# Patient Record
Sex: Male | Born: 1955 | Race: Black or African American | Hispanic: No | Marital: Married | Smoking: Never smoker
Health system: Southern US, Community
[De-identification: ages and names within clinical notes are randomized; demographics above are authoritative.]

## PROBLEM LIST (undated history)

## (undated) DIAGNOSIS — F329 Major depressive disorder, single episode, unspecified: Secondary | ICD-10-CM

## (undated) DIAGNOSIS — E785 Hyperlipidemia, unspecified: Secondary | ICD-10-CM

## (undated) DIAGNOSIS — E119 Type 2 diabetes mellitus without complications: Secondary | ICD-10-CM

## (undated) DIAGNOSIS — I2510 Atherosclerotic heart disease of native coronary artery without angina pectoris: Secondary | ICD-10-CM

## (undated) DIAGNOSIS — J45909 Unspecified asthma, uncomplicated: Secondary | ICD-10-CM

## (undated) DIAGNOSIS — K5792 Diverticulitis of intestine, part unspecified, without perforation or abscess without bleeding: Secondary | ICD-10-CM

## (undated) DIAGNOSIS — F32A Depression, unspecified: Secondary | ICD-10-CM

## (undated) DIAGNOSIS — I519 Heart disease, unspecified: Secondary | ICD-10-CM

## (undated) DIAGNOSIS — I82409 Acute embolism and thrombosis of unspecified deep veins of unspecified lower extremity: Secondary | ICD-10-CM

## (undated) DIAGNOSIS — I10 Essential (primary) hypertension: Secondary | ICD-10-CM

## (undated) HISTORY — DX: Depression, unspecified: F32.A

## (undated) HISTORY — PX: APPENDECTOMY: SHX54

## (undated) HISTORY — PX: CHOLECYSTECTOMY: SHX55

## (undated) HISTORY — PX: COLOSTOMY REVERSAL: SHX5782

## (undated) HISTORY — DX: Heart disease, unspecified: I51.9

## (undated) HISTORY — DX: Hyperlipidemia, unspecified: E78.5

## (undated) HISTORY — DX: Atherosclerotic heart disease of native coronary artery without angina pectoris: I25.10

## (undated) HISTORY — PX: KNEE SURGERY: SHX244

---
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
## 1998-01-27 ENCOUNTER — Ambulatory Visit (HOSPITAL_BASED_OUTPATIENT_CLINIC_OR_DEPARTMENT_OTHER): Admission: RE | Admit: 1998-01-27 | Discharge: 1998-01-27 | Payer: Self-pay | Admitting: Orthopedic Surgery

## 1999-01-26 ENCOUNTER — Ambulatory Visit (HOSPITAL_BASED_OUTPATIENT_CLINIC_OR_DEPARTMENT_OTHER): Admission: RE | Admit: 1999-01-26 | Discharge: 1999-01-26 | Payer: Self-pay | Admitting: Orthopedic Surgery

## 1999-02-02 ENCOUNTER — Inpatient Hospital Stay (HOSPITAL_COMMUNITY): Admission: EM | Admit: 1999-02-02 | Discharge: 1999-02-07 | Payer: Self-pay | Admitting: Emergency Medicine

## 1999-02-12 ENCOUNTER — Inpatient Hospital Stay (HOSPITAL_COMMUNITY): Admission: EM | Admit: 1999-02-12 | Discharge: 1999-02-20 | Payer: Self-pay | Admitting: Internal Medicine

## 1999-02-12 ENCOUNTER — Encounter: Payer: Self-pay | Admitting: Surgery

## 1999-02-18 ENCOUNTER — Encounter: Payer: Self-pay | Admitting: Surgery

## 1999-04-01 ENCOUNTER — Emergency Department (HOSPITAL_COMMUNITY): Admission: EM | Admit: 1999-04-01 | Discharge: 1999-04-01 | Payer: Self-pay | Admitting: Emergency Medicine

## 1999-04-22 ENCOUNTER — Ambulatory Visit (HOSPITAL_COMMUNITY): Admission: RE | Admit: 1999-04-22 | Discharge: 1999-04-22 | Payer: Self-pay | Admitting: Surgery

## 1999-04-22 ENCOUNTER — Encounter: Payer: Self-pay | Admitting: Surgery

## 1999-04-23 ENCOUNTER — Ambulatory Visit (HOSPITAL_COMMUNITY): Admission: RE | Admit: 1999-04-23 | Discharge: 1999-04-23 | Payer: Self-pay | Admitting: Surgery

## 1999-04-23 ENCOUNTER — Encounter: Payer: Self-pay | Admitting: Surgery

## 1999-04-29 ENCOUNTER — Emergency Department (HOSPITAL_COMMUNITY): Admission: EM | Admit: 1999-04-29 | Discharge: 1999-04-29 | Payer: Self-pay | Admitting: *Deleted

## 1999-05-04 ENCOUNTER — Encounter: Payer: Self-pay | Admitting: Surgery

## 1999-05-04 ENCOUNTER — Ambulatory Visit (HOSPITAL_COMMUNITY): Admission: RE | Admit: 1999-05-04 | Discharge: 1999-05-04 | Payer: Self-pay | Admitting: Surgery

## 2000-05-02 ENCOUNTER — Ambulatory Visit (HOSPITAL_BASED_OUTPATIENT_CLINIC_OR_DEPARTMENT_OTHER): Admission: RE | Admit: 2000-05-02 | Discharge: 2000-05-02 | Payer: Self-pay | Admitting: Orthopedic Surgery

## 2000-05-03 ENCOUNTER — Emergency Department (HOSPITAL_COMMUNITY): Admission: EM | Admit: 2000-05-03 | Discharge: 2000-05-03 | Payer: Self-pay | Admitting: Emergency Medicine

## 2000-10-14 ENCOUNTER — Encounter: Payer: Self-pay | Admitting: Surgery

## 2000-10-14 ENCOUNTER — Observation Stay (HOSPITAL_COMMUNITY): Admission: RE | Admit: 2000-10-14 | Discharge: 2000-10-15 | Payer: Self-pay | Admitting: Surgery

## 2001-04-10 ENCOUNTER — Inpatient Hospital Stay (HOSPITAL_COMMUNITY): Admission: RE | Admit: 2001-04-10 | Discharge: 2001-04-20 | Payer: Self-pay | Admitting: Surgery

## 2001-04-11 ENCOUNTER — Encounter: Payer: Self-pay | Admitting: Internal Medicine

## 2001-08-01 ENCOUNTER — Encounter: Payer: Self-pay | Admitting: Emergency Medicine

## 2001-08-01 ENCOUNTER — Emergency Department (HOSPITAL_COMMUNITY): Admission: EM | Admit: 2001-08-01 | Discharge: 2001-08-01 | Payer: Self-pay | Admitting: Emergency Medicine

## 2001-08-29 ENCOUNTER — Inpatient Hospital Stay (HOSPITAL_COMMUNITY): Admission: RE | Admit: 2001-08-29 | Discharge: 2001-09-06 | Payer: Self-pay | Admitting: Surgery

## 2001-09-03 ENCOUNTER — Encounter: Payer: Self-pay | Admitting: Surgery

## 2001-09-12 ENCOUNTER — Ambulatory Visit (HOSPITAL_COMMUNITY): Admission: RE | Admit: 2001-09-12 | Discharge: 2001-09-12 | Payer: Self-pay | Admitting: Surgery

## 2001-09-12 ENCOUNTER — Encounter: Payer: Self-pay | Admitting: Surgery

## 2001-09-18 ENCOUNTER — Encounter: Payer: Self-pay | Admitting: Surgery

## 2001-09-18 ENCOUNTER — Inpatient Hospital Stay (HOSPITAL_COMMUNITY): Admission: EM | Admit: 2001-09-18 | Discharge: 2001-09-26 | Payer: Self-pay | Admitting: *Deleted

## 2001-09-19 ENCOUNTER — Encounter: Payer: Self-pay | Admitting: Surgery

## 2001-09-24 ENCOUNTER — Encounter: Payer: Self-pay | Admitting: Surgery

## 2001-10-17 ENCOUNTER — Encounter: Payer: Self-pay | Admitting: Surgery

## 2001-10-17 ENCOUNTER — Ambulatory Visit (HOSPITAL_COMMUNITY): Admission: RE | Admit: 2001-10-17 | Discharge: 2001-10-17 | Payer: Self-pay | Admitting: Surgery

## 2001-11-29 ENCOUNTER — Ambulatory Visit (HOSPITAL_COMMUNITY): Admission: RE | Admit: 2001-11-29 | Discharge: 2001-11-29 | Payer: Self-pay | Admitting: Gastroenterology

## 2001-12-26 ENCOUNTER — Encounter: Payer: Self-pay | Admitting: Surgery

## 2001-12-26 ENCOUNTER — Ambulatory Visit (HOSPITAL_COMMUNITY): Admission: RE | Admit: 2001-12-26 | Discharge: 2001-12-26 | Payer: Self-pay | Admitting: Surgery

## 2002-01-01 ENCOUNTER — Inpatient Hospital Stay (HOSPITAL_COMMUNITY): Admission: AD | Admit: 2002-01-01 | Discharge: 2002-01-04 | Payer: Self-pay | Admitting: Surgery

## 2002-09-07 ENCOUNTER — Ambulatory Visit (HOSPITAL_COMMUNITY): Admission: RE | Admit: 2002-09-07 | Discharge: 2002-09-07 | Payer: Self-pay | Admitting: Surgery

## 2002-09-07 ENCOUNTER — Encounter: Payer: Self-pay | Admitting: Surgery

## 2002-10-18 ENCOUNTER — Inpatient Hospital Stay (HOSPITAL_COMMUNITY): Admission: RE | Admit: 2002-10-18 | Discharge: 2002-10-21 | Payer: Self-pay | Admitting: Surgery

## 2003-07-26 ENCOUNTER — Encounter
Admission: RE | Admit: 2003-07-26 | Discharge: 2003-10-24 | Payer: Self-pay | Admitting: Physical Medicine & Rehabilitation

## 2003-10-31 ENCOUNTER — Encounter
Admission: RE | Admit: 2003-10-31 | Discharge: 2004-01-29 | Payer: Self-pay | Admitting: Physical Medicine & Rehabilitation

## 2004-04-24 ENCOUNTER — Encounter
Admission: RE | Admit: 2004-04-24 | Discharge: 2004-07-23 | Payer: Self-pay | Admitting: Physical Medicine & Rehabilitation

## 2005-11-22 ENCOUNTER — Encounter: Admission: RE | Admit: 2005-11-22 | Discharge: 2005-11-22 | Payer: Self-pay | Admitting: Surgery

## 2005-12-13 ENCOUNTER — Encounter: Admission: RE | Admit: 2005-12-13 | Discharge: 2005-12-13 | Payer: Self-pay | Admitting: Surgery

## 2005-12-28 ENCOUNTER — Ambulatory Visit (HOSPITAL_COMMUNITY): Admission: RE | Admit: 2005-12-28 | Discharge: 2005-12-28 | Payer: Self-pay | Admitting: Surgery

## 2006-02-21 ENCOUNTER — Ambulatory Visit (HOSPITAL_COMMUNITY): Admission: RE | Admit: 2006-02-21 | Discharge: 2006-02-21 | Payer: Self-pay | Admitting: Surgery

## 2006-04-23 ENCOUNTER — Emergency Department (HOSPITAL_COMMUNITY): Admission: EM | Admit: 2006-04-23 | Discharge: 2006-04-24 | Payer: Self-pay | Admitting: Emergency Medicine

## 2013-02-18 ENCOUNTER — Emergency Department (HOSPITAL_BASED_OUTPATIENT_CLINIC_OR_DEPARTMENT_OTHER): Payer: 59

## 2013-02-18 ENCOUNTER — Emergency Department (HOSPITAL_BASED_OUTPATIENT_CLINIC_OR_DEPARTMENT_OTHER)
Admission: EM | Admit: 2013-02-18 | Discharge: 2013-02-18 | Disposition: A | Discharge status: Home / Self Care | Payer: 59 | Attending: Emergency Medicine | Admitting: Emergency Medicine

## 2013-02-18 ENCOUNTER — Encounter (HOSPITAL_BASED_OUTPATIENT_CLINIC_OR_DEPARTMENT_OTHER): Payer: Self-pay | Admitting: *Deleted

## 2013-02-18 DIAGNOSIS — Z79899 Other long term (current) drug therapy: Secondary | ICD-10-CM | POA: Insufficient documentation

## 2013-02-18 DIAGNOSIS — J45909 Unspecified asthma, uncomplicated: Secondary | ICD-10-CM

## 2013-02-18 DIAGNOSIS — Z8719 Personal history of other diseases of the digestive system: Secondary | ICD-10-CM | POA: Insufficient documentation

## 2013-02-18 DIAGNOSIS — J45901 Unspecified asthma with (acute) exacerbation: Secondary | ICD-10-CM | POA: Insufficient documentation

## 2013-02-18 DIAGNOSIS — R509 Fever, unspecified: Secondary | ICD-10-CM | POA: Insufficient documentation

## 2013-02-18 DIAGNOSIS — Z8701 Personal history of pneumonia (recurrent): Secondary | ICD-10-CM | POA: Insufficient documentation

## 2013-02-18 DIAGNOSIS — J3489 Other specified disorders of nose and nasal sinuses: Secondary | ICD-10-CM | POA: Insufficient documentation

## 2013-02-18 HISTORY — DX: Diverticulitis of intestine, part unspecified, without perforation or abscess without bleeding: K57.92

## 2013-02-18 HISTORY — DX: Unspecified asthma, uncomplicated: J45.909

## 2013-02-18 MED ORDER — AZITHROMYCIN 250 MG PO TABS: ORAL_TABLET | ORAL | Status: DC

## 2013-02-18 MED ORDER — ALBUTEROL SULFATE (5 MG/ML) 0.5% IN NEBU
5.0000 mg | INHALATION_SOLUTION | Freq: Once | RESPIRATORY_TRACT | Status: AC
  Administered 2013-02-18: 5 mg via RESPIRATORY_TRACT
  Filled 2013-02-18: qty 1

## 2013-02-18 MED ORDER — IPRATROPIUM BROMIDE 0.02 % IN SOLN
0.5000 mg | Freq: Once | RESPIRATORY_TRACT | Status: AC
  Administered 2013-02-18: 0.5 mg via RESPIRATORY_TRACT
  Filled 2013-02-18: qty 2.5

## 2013-02-18 MED ORDER — PREDNISONE 10 MG PO TABS: 20.0000 mg | ORAL_TABLET | Freq: Every day | ORAL | Status: DC

## 2013-02-18 MED ORDER — PREDNISONE 10 MG PO TABS
60.0000 mg | ORAL_TABLET | Freq: Once | ORAL | Status: AC
  Administered 2013-02-18: 60 mg via ORAL
  Filled 2013-02-18: qty 1

## 2013-02-18 NOTE — ED Notes (Signed)
rx x 2 given for prednisone and zithromax

## 2013-02-18 NOTE — ED Notes (Signed)
Cough, fever onset Friday

## 2013-02-18 NOTE — ED Provider Notes (Signed)
History    This chart was scribed for Eric Quarry, MD by Donne Anon, ED Scribe. This patient was seen in room MH07/MH07 and the patient's care was started at 1837.   CSN: [id number]  Arrival date & time 02/18/13  1803   First MD Initiated Contact with Patient 02/18/13 1837      Chief Complaint  Patient presents with  . Cough     The history is provided by the patient. No language interpreter was used.   Eric Cohen is a 57 y.o. male who presents to the Emergency Department complaining of gradual onset, gradually worsening productive cough which began 2 days ago. He states he also associated SOB, rhinorrhea, congestion and fever (100.2) which has since subsided. He reports normal fluid intake. He used his inhaler 4 times today with mild relief. He has tried NyQuill, Robitussum and Ibuprofen (last dose at 2 PM) with mild relief. He has never been hospitalized for his asthma. He has pneumonia last year, which was the last time he was on steroids, but was not hospitalized for it.  He sees the [facility] for his PCP.    Past Medical History  Diagnosis Date  . Asthma   . Diverticulitis     Past Surgical History  Procedure Laterality Date  . Appendectomy    . Cholecystectomy    . Knee surgery    . Colostomy reversal      History reviewed. No pertinent family history.  History  Substance Use Topics  . Smoking status: Never Smoker   . Smokeless tobacco: Not on file  . Alcohol Use: No      Review of Systems  Constitutional: Positive for fever.  HENT: Positive for congestion and rhinorrhea.   Respiratory: Positive for cough and shortness of breath.   All other systems reviewed and are negative.    Allergies  Review of patient's allergies indicates no known allergies.  Home Medications   Current Outpatient Rx  Name  Route  Sig  Dispense  Refill  . albuterol (PROVENTIL HFA;VENTOLIN HFA) 108 (90 BASE) MCG/ACT inhaler   Inhalation   Inhale 2 puffs into the lungs  every 6 (six) hours as needed for wheezing.         . ALBUTEROL IN   Inhalation   Inhale into the lungs.           BP 169/88  Pulse 106  Temp(Src) 98.6 F (37 C) (Oral)  Resp 20  Ht 5' 11.5" (1.816 m)  Wt 240 lb (108.863 kg)  BMI 33.01 kg/m2  SpO2 95%  Physical Exam  Nursing note and vitals reviewed. Constitutional: He is oriented to person, place, and time. He appears well-developed and well-nourished. No distress.  HENT:  Head: Normocephalic and atraumatic.  Mouth/Throat: Oropharynx is clear and moist.  Eyes: EOM are normal.  Neck: Neck supple. No tracheal deviation present.  Cardiovascular: Normal rate, regular rhythm and normal heart sounds.   Pulmonary/Chest: Effort normal. No respiratory distress.  Musculoskeletal: Normal range of motion.  Neurological: He is alert and oriented to person, place, and time.  Skin: Skin is warm and dry.  Psychiatric: He has a normal mood and affect. His behavior is normal.    ED Course  Procedures (including critical care time) DIAGNOSTIC STUDIES: Oxygen Saturation is 96% on room air, adequate by my interpretation.    COORDINATION OF CARE: 6:43 PM Discussed treatment plan which includes a breathing treatment, steroids and CXR with pt at bedside and pt  agreed to plan.   7:53 PM   Labs Reviewed - No data to display No results found.   No diagnosis found.    MDM   Patient with resolution of wheezing after neb and albuterol  Patient feels he needs z-pack as that is what he is usually given. Discussed nonantibiotic use but patient with strong opinion that this will help. Will given prednisone and zithromax.       Eric Quarry, MD 02/18/13 [phone]

## 2013-03-12 ENCOUNTER — Emergency Department (HOSPITAL_BASED_OUTPATIENT_CLINIC_OR_DEPARTMENT_OTHER)
Admission: EM | Admit: 2013-03-12 | Discharge: 2013-03-12 | Disposition: A | Discharge status: Home / Self Care | Payer: 59 | Attending: Emergency Medicine | Admitting: Emergency Medicine

## 2013-03-12 ENCOUNTER — Encounter (HOSPITAL_BASED_OUTPATIENT_CLINIC_OR_DEPARTMENT_OTHER): Payer: Self-pay | Admitting: Family Medicine

## 2013-03-12 DIAGNOSIS — K625 Hemorrhage of anus and rectum: Secondary | ICD-10-CM | POA: Insufficient documentation

## 2013-03-12 DIAGNOSIS — Z9089 Acquired absence of other organs: Secondary | ICD-10-CM | POA: Insufficient documentation

## 2013-03-12 DIAGNOSIS — IMO0002 Reserved for concepts with insufficient information to code with codable children: Secondary | ICD-10-CM | POA: Insufficient documentation

## 2013-03-12 DIAGNOSIS — J45909 Unspecified asthma, uncomplicated: Secondary | ICD-10-CM | POA: Insufficient documentation

## 2013-03-12 DIAGNOSIS — Z8719 Personal history of other diseases of the digestive system: Secondary | ICD-10-CM | POA: Insufficient documentation

## 2013-03-12 DIAGNOSIS — K6289 Other specified diseases of anus and rectum: Secondary | ICD-10-CM

## 2013-03-12 DIAGNOSIS — Z79899 Other long term (current) drug therapy: Secondary | ICD-10-CM | POA: Insufficient documentation

## 2013-03-12 DIAGNOSIS — K921 Melena: Secondary | ICD-10-CM | POA: Insufficient documentation

## 2013-03-12 LAB — BASIC METABOLIC PANEL
CO2: 28 mEq/L (ref 19–32)
Creatinine, Ser: 1.1 mg/dL (ref 0.50–1.35)
GFR calc non Af Amer: 74 mL/min — ABNORMAL LOW (ref >90)

## 2013-03-12 LAB — CBC
HCT: 35.9 % — ABNORMAL LOW (ref 39.0–52.0)
Hemoglobin: 12.7 g/dL — ABNORMAL LOW (ref 13.0–17.0)
MCHC: 35.4 g/dL (ref 30.0–36.0)
MCV: 86.3 fL (ref 78.0–100.0)
Platelets: 231 10*3/uL (ref 150–400)
RDW: 18.3 % — ABNORMAL HIGH (ref 11.5–15.5)

## 2013-03-12 LAB — OCCULT BLOOD X 1 CARD TO LAB, STOOL: Fecal Occult Bld: NEGATIVE

## 2013-03-12 NOTE — ED Provider Notes (Signed)
Patient seen/examined in the Emergency Department in conjunction with Midlevel Provider  Patient reports rectal bleeding - reports one drop of blood in toilet water and blood on TP.  No melena Exam : awake/alert, no distress, watching TV, denies abdominal pain Plan: labs and d/c home f/u with GI Dr Loreta Ave.  Advised to stop taking all NSAIDs    Joya Gaskins, MD 03/12/13 [phone]

## 2013-03-12 NOTE — ED Notes (Signed)
PA at bedside.

## 2013-03-12 NOTE — ED Provider Notes (Signed)
History     CSN: [id number]  Arrival date & time 03/12/13  1425   First MD Initiated Contact with Patient 03/12/13 1441      Chief Complaint  Patient presents with  . Rectal Bleeding    (Consider location/radiation/quality/duration/timing/severity/associated sxs/prior treatment) Patient is a 57 y.o. male presenting with hematochezia. The history is provided by the patient and medical records. No language interpreter was used.  Rectal Bleeding  The current episode started 2 days ago. The onset was gradual. The problem occurs occasionally. The problem has been unchanged. The pain is mild. The stool is described as hard. Associated symptoms include rectal pain. Pertinent negatives include no anorexia, no fever, no abdominal pain, no diarrhea, no hemorrhoids, no nausea, no vomiting, no hematuria, no vaginal bleeding, no vaginal discharge, no chest pain, no headaches, no coughing, no difficulty breathing and no rash.    Eric Cohen is a 57 y.o. male  with a hx of asthma, diverticulitis presents to the Emergency Department complaining of gradual, intermittent BRBPR only seen only the toilet paper and with 1 drop in the toilet and associated rectal pain with bowel movements. Pt is without abdominal pain, large amounts of blood, melena or black tarry stools.  Pt is taking NSAIDs for pain, but denies epigastric pain.  Nothing makes the symptoms better or worse.  Pt denies fever, chills, headache, weakness, dizziness, syncope, abdominal pain, N/V/D, chest pain, SOB.    Past Medical History  Diagnosis Date  . Asthma   . Diverticulitis     Past Surgical History  Procedure Laterality Date  . Appendectomy    . Cholecystectomy    . Knee surgery    . Colostomy reversal      No family history on file.  History  Substance Use Topics  . Smoking status: Never Smoker   . Smokeless tobacco: Not on file  . Alcohol Use: No      Review of Systems  Constitutional: Negative for fever,  diaphoresis, appetite change, fatigue and unexpected weight change.  HENT: Negative for mouth sores and neck stiffness.   Eyes: Negative for visual disturbance.  Respiratory: Negative for cough, chest tightness, shortness of breath and wheezing.   Cardiovascular: Negative for chest pain.  Gastrointestinal: Positive for blood in stool, hematochezia and rectal pain. Negative for nausea, vomiting, abdominal pain, diarrhea, constipation, anorexia and hemorrhoids.  Endocrine: Negative for polydipsia, polyphagia and polyuria.  Genitourinary: Negative for dysuria, urgency, frequency, hematuria, vaginal bleeding and vaginal discharge.  Musculoskeletal: Negative for back pain.  Skin: Negative for rash.  Allergic/Immunologic: Negative for immunocompromised state.  Neurological: Negative for syncope, light-headedness and headaches.  Hematological: Does not bruise/bleed easily.  Psychiatric/Behavioral: Negative for sleep disturbance. The patient is not nervous/anxious.     Allergies  Review of patient's allergies indicates no known allergies.  Home Medications   Current Outpatient Rx  Name  Route  Sig  Dispense  Refill  . azithromycin (ZITHROMAX Z-PAK) 250 MG tablet      Per package instructions   6 each   0   . predniSONE (DELTASONE) 10 MG tablet   Oral   Take 2 tablets (20 mg total) by mouth daily.   15 tablet   0   . albuterol (PROVENTIL HFA;VENTOLIN HFA) 108 (90 BASE) MCG/ACT inhaler   Inhalation   Inhale 2 puffs into the lungs every 6 (six) hours as needed for wheezing.         . ALBUTEROL IN   Inhalation  Inhale into the lungs.           BP 151/89  Pulse 94  Temp(Src) 98.5 F (36.9 C) (Oral)  Resp 18  Ht 5' 11.5" (1.816 m)  Wt 235 lb (106.595 kg)  BMI 32.32 kg/m2  SpO2 98%  Physical Exam  Nursing note and vitals reviewed. Constitutional: He is oriented to person, place, and time. He appears well-developed and well-nourished. No distress.  HENT:  Head:  Normocephalic and atraumatic.  Mouth/Throat: Oropharynx is clear and moist. No oropharyngeal exudate.  Eyes: Conjunctivae and EOM are normal. Pupils are equal, round, and reactive to light. No scleral icterus.  Neck: Normal range of motion. Neck supple.  Cardiovascular: Normal rate, regular rhythm, normal heart sounds and intact distal pulses.   No murmur heard. Pulmonary/Chest: Effort normal and breath sounds normal. No respiratory distress. He has no wheezes.  Abdominal: Soft. Bowel sounds are normal. He exhibits no mass. There is no tenderness. There is no rebound and no guarding.  Genitourinary: Rectal exam shows tenderness. Rectal exam shows no external hemorrhoid, no internal hemorrhoid, no fissure, no mass and anal tone normal. Guaiac negative stool.  Pt with tenderness on rectal exam, but not palpable internal hemorrhoids, no visual external hemorrhoids or anal fissure.  No gross BRBPR or melena on exam.  Guiac negative.  Musculoskeletal: Normal range of motion. He exhibits no edema.  Lymphadenopathy:    He has no cervical adenopathy.  Neurological: He is alert and oriented to person, place, and time. He exhibits normal muscle tone. Coordination normal.  Speech is clear and goal oriented Moves extremities without ataxia  Skin: Skin is warm and dry. He is not diaphoretic. No erythema.  Psychiatric: He has a normal mood and affect.    ED Course  Procedures (including critical care time)  Labs Reviewed  CBC - Abnormal; Notable for the following:    RBC 4.16 (*)    Hemoglobin 12.7 (*)    HCT 35.9 (*)    RDW 18.3 (*)    All other components within normal limits  BASIC METABOLIC PANEL - Abnormal; Notable for the following:    Glucose, Bld 143 (*)    GFR calc non Af Amer 74 (*)    GFR calc Af Amer 86 (*)    All other components within normal limits  OCCULT BLOOD X 1 CARD TO LAB, STOOL   No results found.   1. Rectal bleeding   2. Rectal pain       MDM  [name]  presents with c/o BRBPR.  Alert, nontoxic, nonseptic appearing without pallor.  No abdominal pain on exam. Guiac negative stool.  Hgb 12.7, unknown baseline.  Pt not tachycardic or hypotensive.  Recommended that pt stop taking his NSAIDs and f/u with GI this week.  I have also discussed reasons to return immediately to the ER.  Patient expresses understanding and agrees with plan.         Dahlia Client Florice Hindle, PA-C 03/12/13 1606

## 2013-03-12 NOTE — ED Notes (Addendum)
Pt c/o bright red blood in stool since yesterday with pain to rectum. Pt sts Dr Loreta Ave is GI dr.

## 2013-03-13 NOTE — ED Provider Notes (Signed)
Medical screening examination/treatment/procedure(s) were performed by non-physician practitioner and as supervising physician I was immediately available for consultation/collaboration.   Raffi Milstein III, MD 03/13/13 1417 

## 2013-11-02 ENCOUNTER — Encounter (HOSPITAL_BASED_OUTPATIENT_CLINIC_OR_DEPARTMENT_OTHER): Payer: Self-pay | Admitting: Emergency Medicine

## 2013-11-02 ENCOUNTER — Emergency Department (HOSPITAL_BASED_OUTPATIENT_CLINIC_OR_DEPARTMENT_OTHER)
Admission: EM | Admit: 2013-11-02 | Discharge: 2013-11-02 | Disposition: A | Discharge status: Home / Self Care | Payer: 59 | Attending: Emergency Medicine | Admitting: Emergency Medicine

## 2013-11-02 DIAGNOSIS — IMO0002 Reserved for concepts with insufficient information to code with codable children: Secondary | ICD-10-CM | POA: Insufficient documentation

## 2013-11-02 DIAGNOSIS — J45909 Unspecified asthma, uncomplicated: Secondary | ICD-10-CM | POA: Insufficient documentation

## 2013-11-02 DIAGNOSIS — Z8719 Personal history of other diseases of the digestive system: Secondary | ICD-10-CM | POA: Insufficient documentation

## 2013-11-02 DIAGNOSIS — Z79899 Other long term (current) drug therapy: Secondary | ICD-10-CM | POA: Insufficient documentation

## 2013-11-02 DIAGNOSIS — Z791 Long term (current) use of non-steroidal anti-inflammatories (NSAID): Secondary | ICD-10-CM | POA: Insufficient documentation

## 2013-11-02 DIAGNOSIS — J111 Influenza due to unidentified influenza virus with other respiratory manifestations: Secondary | ICD-10-CM | POA: Insufficient documentation

## 2013-11-02 MED ORDER — ONDANSETRON 4 MG PO TBDP
4.0000 mg | ORAL_TABLET | Freq: Once | ORAL | Status: AC
  Administered 2013-11-02: 4 mg via ORAL
  Filled 2013-11-02: qty 1

## 2013-11-02 MED ORDER — ONDANSETRON 4 MG PO TBDP: 4.0000 mg | ORAL_TABLET | Freq: Three times a day (TID) | ORAL | Status: DC | PRN

## 2013-11-02 MED ORDER — OSELTAMIVIR PHOSPHATE 75 MG PO CAPS: 75.0000 mg | ORAL_CAPSULE | Freq: Two times a day (BID) | ORAL | Status: DC

## 2013-11-02 MED ORDER — HYDROCODONE-ACETAMINOPHEN 5-325 MG PO TABS: 2.0000 | ORAL_TABLET | ORAL | Status: DC | PRN

## 2013-11-02 MED ORDER — HYDROCODONE-ACETAMINOPHEN 5-325 MG PO TABS
1.0000 | ORAL_TABLET | Freq: Once | ORAL | Status: AC
  Administered 2013-11-02: 1 via ORAL
  Filled 2013-11-02: qty 1

## 2013-11-02 NOTE — ED Notes (Signed)
MD at bedside. 

## 2013-11-02 NOTE — ED Notes (Signed)
Fever, generalized body aches x 2 days.

## 2013-11-02 NOTE — Discharge Instructions (Signed)
Rest and stay hydrated. Tylenol or ibuprofen for fever. Vicodin for body aches or headaches. Zofran for nausea  Influenza, Adult Influenza ("the flu") is a viral infection of the respiratory tract. It occurs more often in winter months because people spend more time in close contact with one another. Influenza can make you feel very sick. Influenza easily spreads from person to person (contagious). CAUSES  Influenza is caused by a virus that infects the respiratory tract. You can catch the virus by breathing in droplets from an infected person's cough or sneeze. You can also catch the virus by touching something that was recently contaminated with the virus and then touching your mouth, nose, or eyes. SYMPTOMS  Symptoms typically last 4 to 10 days and may include:  Fever.  Chills.  Headache, body aches, and muscle aches.  Sore throat.  Chest discomfort and cough.  Poor appetite.  Weakness or feeling tired.  Dizziness.  Nausea or vomiting. DIAGNOSIS  Diagnosis of influenza is often made based on your history and a physical exam. A nose or throat swab test can be done to confirm the diagnosis. RISKS AND COMPLICATIONS You may be at risk for a more severe case of influenza if you smoke cigarettes, have diabetes, have chronic heart disease (such as heart failure) or lung disease (such as asthma), or if you have a weakened immune system. Elderly people and pregnant women are also at risk for more serious infections. The most common complication of influenza is a lung infection (pneumonia). Sometimes, this complication can require emergency medical care and may be life-threatening. PREVENTION  An annual influenza vaccination (flu shot) is the best way to avoid getting influenza. An annual flu shot is now routinely recommended for all adults in the [place] TREATMENT  In mild cases, influenza goes away on its own. Treatment is directed at relieving symptoms. For more severe cases, your  caregiver may prescribe antiviral medicines to shorten the sickness. Antibiotic medicines are not effective, because the infection is caused by a virus, not by bacteria. HOME CARE INSTRUCTIONS  Only take over-the-counter or prescription medicines for pain, discomfort, or fever as directed by your caregiver.  Use a cool mist humidifier to make breathing easier.  Get plenty of rest until your temperature returns to normal. This usually takes 3 to 4 days.  Drink enough fluids to keep your urine clear or pale yellow.  Cover your mouth and nose when coughing or sneezing, and wash your hands well to avoid spreading the virus.  Stay home from work or school until your fever has been gone for at least 1 full day. SEEK MEDICAL CARE IF:   You have chest pain or a deep cough that worsens or produces more mucus.  You have nausea, vomiting, or diarrhea. SEEK IMMEDIATE MEDICAL CARE IF:   You have difficulty breathing, shortness of breath, or your skin or nails turn bluish.  You have severe neck pain or stiffness.  You have a severe headache, facial pain, or earache.  You have a worsening or recurring fever.  You have nausea or vomiting that cannot be controlled. MAKE SURE YOU:  Understand these instructions.  Will watch your condition.  Will get help right away if you are not doing well or get worse. Document Released: 10/08/2000 Document Revised: 04/11/2012 Document Reviewed: 01/10/2012 [facility] Patient Information 2014 [place], [place].

## 2013-11-02 NOTE — ED Provider Notes (Signed)
CSN: [id number]     Arrival date & time 11/02/13  1031 History   First MD Initiated Contact with Patient 11/02/13 1044     Chief Complaint  Patient presents with  . Fever  . Generalized Body Aches    HPI  Patient presents with 36 hour illness. Headache. Bodyaches myalgias. Dry cough. Sore throat. Nausea but no vomiting. Did not receive a flu vaccine. States "every time I get a flu shot yet pneumonia". No ill exposures. Taking over-the-counter medications without relief.  Past Medical History  Diagnosis Date  . Asthma   . Diverticulitis    Past Surgical History  Procedure Laterality Date  . Appendectomy    . Cholecystectomy    . Knee surgery    . Colostomy reversal     No family history on file. History  Substance Use Topics  . Smoking status: Never Smoker   . Smokeless tobacco: Not on file  . Alcohol Use: No    Review of Systems  Constitutional: Positive for fever and fatigue. Negative for chills, diaphoresis and appetite change.  HENT: Positive for sore throat. Negative for ear pain, mouth sores, rhinorrhea, sinus pressure and trouble swallowing.   Eyes: Negative for visual disturbance.  Respiratory: Positive for cough. Negative for chest tightness, shortness of breath and wheezing.   Cardiovascular: Negative for chest pain.  Gastrointestinal: Negative for nausea, vomiting, abdominal pain, diarrhea and abdominal distention.  Endocrine: Negative for polydipsia, polyphagia and polyuria.  Genitourinary: Negative for dysuria, frequency and hematuria.  Musculoskeletal: Negative for gait problem.  Skin: Negative for color change, pallor and rash.  Neurological: Positive for headaches. Negative for dizziness, syncope and light-headedness.  Hematological: Does not bruise/bleed easily.  Psychiatric/Behavioral: Negative for behavioral problems and confusion.    Allergies  Review of patient's allergies indicates no known allergies.  Home Medications   Current Outpatient Rx   Name  Route  Sig  Dispense  Refill  . Budesonide-Formoterol Fumarate (SYMBICORT IN)   Inhalation   Inhale into the lungs.         . naproxen (NAPROSYN) 500 MG tablet   Oral   Take 500 mg by mouth 2 (two) times daily with a meal.         . albuterol (PROVENTIL HFA;VENTOLIN HFA) 108 (90 BASE) MCG/ACT inhaler   Inhalation   Inhale 2 puffs into the lungs every 6 (six) hours as needed for wheezing.         . ALBUTEROL IN   Inhalation   Inhale into the lungs.         Marland Kitchen. azithromycin (ZITHROMAX Z-PAK) 250 MG tablet      Per package instructions   6 each   0   . HYDROcodone-acetaminophen (NORCO/VICODIN) 5-325 MG per tablet   Oral   Take 2 tablets by mouth every 4 (four) hours as needed.   10 tablet   0   . ondansetron (ZOFRAN ODT) 4 MG disintegrating tablet   Oral   Take 1 tablet (4 mg total) by mouth every 8 (eight) hours as needed for nausea.   20 tablet   0   . oseltamivir (TAMIFLU) 75 MG capsule   Oral   Take 1 capsule (75 mg total) by mouth every 12 (twelve) hours.   10 capsule   0   . predniSONE (DELTASONE) 10 MG tablet   Oral   Take 2 tablets (20 mg total) by mouth daily.   15 tablet   0    BP 146/84  Pulse 91  Temp(Src) 98.4 F (36.9 C) (Oral)  Resp 18  Ht 5\' 11"  (1.803 m)  Wt 237 lb (107.502 kg)  BMI 33.07 kg/m2  SpO2 96% Physical Exam  Constitutional: He is oriented to person, place, and time. He appears well-developed and well-nourished. No distress.  HENT:  Head: Normocephalic.  Nontender over the sinuses. Erythematous posterior pharynx without tonsillar hypertrophy or exudate. No adenopathy in the neck. Neck is supple. Nares clear. Normal TMs.  Eyes: Conjunctivae are normal. Pupils are equal, round, and reactive to light. No scleral icterus.  Neck: Normal range of motion. Neck supple. No thyromegaly present.  Cardiovascular: Normal rate and regular rhythm.  Exam reveals no gallop and no friction rub.   No murmur heard. Pulmonary/Chest:  Effort normal and breath sounds normal. No respiratory distress. He has no wheezes. He has no rales.  Normal pulmonary exam. No wheezing rales rhonchi or prolongation.  Abdominal: Soft. Bowel sounds are normal. He exhibits no distension. There is no tenderness. There is no rebound.  Musculoskeletal: Normal range of motion.  Neurological: He is alert and oriented to person, place, and time.  Skin: Skin is warm and dry. No rash noted.  Psychiatric: He has a normal mood and affect. His behavior is normal.    ED Course  Procedures (including critical care time) Labs Review Labs Reviewed - No data to display Imaging Review No results found.  EKG Interpretation   None       MDM   1. Influenza    Clinically no signs of pneumonia. Not tachycardic. Not hypoxemic. No abnormal breath sounds. No sign of strep. No symptoms or findings that would suggest operative or bacterial infection. Plan will be appear treatment with Tamiflu for probable influenza and otherwise symptomatic treatment.    Rolland Porter, MD 11/02/13 1056

## 2013-11-02 NOTE — ED Notes (Signed)
See EDP assessment 

## 2014-04-29 ENCOUNTER — Emergency Department (HOSPITAL_BASED_OUTPATIENT_CLINIC_OR_DEPARTMENT_OTHER)
Admission: EM | Admit: 2014-04-29 | Discharge: 2014-04-29 | Discharge status: Against Medical Advice | Attending: Emergency Medicine | Admitting: Emergency Medicine

## 2014-04-29 ENCOUNTER — Encounter (HOSPITAL_BASED_OUTPATIENT_CLINIC_OR_DEPARTMENT_OTHER): Payer: Self-pay | Admitting: Emergency Medicine

## 2014-04-29 DIAGNOSIS — Y9289 Other specified places as the place of occurrence of the external cause: Secondary | ICD-10-CM | POA: Insufficient documentation

## 2014-04-29 DIAGNOSIS — J45909 Unspecified asthma, uncomplicated: Secondary | ICD-10-CM | POA: Insufficient documentation

## 2014-04-29 DIAGNOSIS — Y9389 Activity, other specified: Secondary | ICD-10-CM | POA: Insufficient documentation

## 2014-04-29 DIAGNOSIS — S90569A Insect bite (nonvenomous), unspecified ankle, initial encounter: Secondary | ICD-10-CM | POA: Insufficient documentation

## 2014-04-29 DIAGNOSIS — W57XXXA Bitten or stung by nonvenomous insect and other nonvenomous arthropods, initial encounter: Principal | ICD-10-CM

## 2014-04-29 NOTE — ED Notes (Signed)
Pt reports today with ? Insect bite on right inner thigh.  Has redness and swelling to site.

## 2014-04-29 NOTE — ED Notes (Signed)
Per registration pt left ama d/t long wait time.

## 2014-10-16 ENCOUNTER — Ambulatory Visit
Admission: RE | Admit: 2014-10-16 | Discharge: 2014-10-16 | Disposition: A | Discharge status: Home / Self Care | Payer: 59 | Source: Ambulatory Visit | Attending: Family Medicine | Admitting: Family Medicine

## 2014-10-16 ENCOUNTER — Other Ambulatory Visit: Payer: Self-pay | Admitting: Family Medicine

## 2014-10-16 DIAGNOSIS — M542 Cervicalgia: Secondary | ICD-10-CM

## 2014-10-16 DIAGNOSIS — M545 Low back pain: Secondary | ICD-10-CM

## 2016-03-23 ENCOUNTER — Ambulatory Visit
Admission: RE | Admit: 2016-03-23 | Discharge: 2016-03-23 | Disposition: A | Discharge status: Home / Self Care | Payer: 59 | Source: Ambulatory Visit | Attending: Family Medicine | Admitting: Family Medicine

## 2016-03-23 ENCOUNTER — Other Ambulatory Visit: Payer: Self-pay | Admitting: Family Medicine

## 2016-03-23 DIAGNOSIS — R059 Cough, unspecified: Secondary | ICD-10-CM

## 2016-03-23 DIAGNOSIS — R05 Cough: Secondary | ICD-10-CM

## 2016-09-01 ENCOUNTER — Other Ambulatory Visit: Payer: Self-pay | Admitting: Orthopedic Surgery

## 2016-09-01 DIAGNOSIS — M533 Sacrococcygeal disorders, not elsewhere classified: Secondary | ICD-10-CM

## 2016-09-10 ENCOUNTER — Ambulatory Visit: Admission: RE | Admit: 2016-09-10 | Payer: Self-pay | Source: Ambulatory Visit

## 2016-09-10 ENCOUNTER — Ambulatory Visit
Admission: RE | Admit: 2016-09-10 | Discharge: 2016-09-10 | Disposition: A | Discharge status: Home / Self Care | Payer: Self-pay | Source: Ambulatory Visit | Attending: Orthopedic Surgery | Admitting: Orthopedic Surgery

## 2016-09-10 DIAGNOSIS — M533 Sacrococcygeal disorders, not elsewhere classified: Secondary | ICD-10-CM

## 2019-02-02 ENCOUNTER — Other Ambulatory Visit: Payer: Self-pay | Admitting: Family Medicine

## 2019-02-02 ENCOUNTER — Other Ambulatory Visit: Payer: Self-pay

## 2019-02-02 ENCOUNTER — Ambulatory Visit
Admission: RE | Admit: 2019-02-02 | Discharge: 2019-02-02 | Disposition: A | Discharge status: Home / Self Care | Payer: 59 | Source: Ambulatory Visit | Attending: Family Medicine | Admitting: Family Medicine

## 2019-02-02 DIAGNOSIS — M79661 Pain in right lower leg: Secondary | ICD-10-CM

## 2019-02-06 ENCOUNTER — Emergency Department (HOSPITAL_BASED_OUTPATIENT_CLINIC_OR_DEPARTMENT_OTHER)
Admission: EM | Admit: 2019-02-06 | Discharge: 2019-02-06 | Disposition: A | Discharge status: Home / Self Care | Payer: 59 | Attending: Emergency Medicine | Admitting: Emergency Medicine

## 2019-02-06 ENCOUNTER — Other Ambulatory Visit: Payer: Self-pay

## 2019-02-06 ENCOUNTER — Emergency Department (HOSPITAL_BASED_OUTPATIENT_CLINIC_OR_DEPARTMENT_OTHER): Payer: 59

## 2019-02-06 ENCOUNTER — Encounter (HOSPITAL_BASED_OUTPATIENT_CLINIC_OR_DEPARTMENT_OTHER): Payer: Self-pay

## 2019-02-06 DIAGNOSIS — Z79899 Other long term (current) drug therapy: Secondary | ICD-10-CM | POA: Insufficient documentation

## 2019-02-06 DIAGNOSIS — I824Z1 Acute embolism and thrombosis of unspecified deep veins of right distal lower extremity: Secondary | ICD-10-CM | POA: Insufficient documentation

## 2019-02-06 DIAGNOSIS — J45909 Unspecified asthma, uncomplicated: Secondary | ICD-10-CM | POA: Insufficient documentation

## 2019-02-06 DIAGNOSIS — E119 Type 2 diabetes mellitus without complications: Secondary | ICD-10-CM | POA: Insufficient documentation

## 2019-02-06 DIAGNOSIS — M79604 Pain in right leg: Secondary | ICD-10-CM | POA: Diagnosis present

## 2019-02-06 HISTORY — DX: Type 2 diabetes mellitus without complications: E11.9

## 2019-02-06 LAB — BASIC METABOLIC PANEL
Anion gap: 7 (ref 5–15)
BUN: 13 mg/dL (ref 8–23)
CO2: 26 mmol/L (ref 22–32)
Calcium: 9 mg/dL (ref 8.9–10.3)
Chloride: 102 mmol/L (ref 98–111)
Creatinine, Ser: 1.14 mg/dL (ref 0.61–1.24)
GFR calc Af Amer: >60 mL/min (ref >60)
GFR calc non Af Amer: >60 mL/min (ref >60)
Glucose, Bld: 287 mg/dL — ABNORMAL HIGH (ref 70–99)
Potassium: 4 mmol/L (ref 3.5–5.1)
Sodium: 135 mmol/L (ref 135–145)

## 2019-02-06 LAB — CBC
HCT: 37.6 % — ABNORMAL LOW (ref 39.0–52.0)
Hemoglobin: 12.7 g/dL — ABNORMAL LOW (ref 13.0–17.0)
MCH: 29.4 pg (ref 26.0–34.0)
MCHC: 33.8 g/dL (ref 30.0–36.0)
MCV: 87 fL (ref 80.0–100.0)
Platelets: 197 10*3/uL (ref 150–400)
RBC: 4.32 MIL/uL (ref 4.22–5.81)
RDW: 17.2 % — ABNORMAL HIGH (ref 11.5–15.5)
WBC: 9.6 10*3/uL (ref 4.0–10.5)
nRBC: 0 % (ref 0.0–0.2)

## 2019-02-06 MED ORDER — LIDOCAINE 5 % EX PTCH
1.0000 | MEDICATED_PATCH | CUTANEOUS | Status: DC
  Filled 2019-02-06: qty 1

## 2019-02-06 MED ORDER — OXYCODONE-ACETAMINOPHEN 5-325 MG PO TABS
2.0000 | ORAL_TABLET | Freq: Once | ORAL | Status: AC
  Administered 2019-02-06: 2 via ORAL
  Filled 2019-02-06: qty 2

## 2019-02-06 MED ORDER — RIVAROXABAN (XARELTO) VTE STARTER PACK (15 & 20 MG): ORAL_TABLET | ORAL | 0 refills | Status: DC

## 2019-02-06 MED ORDER — KETOROLAC TROMETHAMINE 60 MG/2ML IM SOLN: 60.0000 mg | Freq: Once | INTRAMUSCULAR | Status: DC

## 2019-02-06 MED ORDER — OXYCODONE-ACETAMINOPHEN 5-325 MG PO TABS: 1.0000 | ORAL_TABLET | Freq: Four times a day (QID) | ORAL | 0 refills | Status: DC | PRN

## 2019-02-06 MED FILL — XARELTO STARTER PACK: 15 & 20 | 28 days supply | Qty: 51 | Fill #0

## 2019-02-06 MED FILL — OXYCODONE-ACETAMINOPHEN 5-3: 5-325 | 2 days supply | Qty: 15 | Fill #0

## 2019-02-06 NOTE — Discharge Instructions (Addendum)
For your blood clot: - Take the blood thinner as prescribed - Avoid immobilizing your leg or using your brace for now - Take the pain medications as needed - Because you are now on a blood thinner, do NOT take ADVIL, ASPIRIN, IBUPROFEN, or other NSAID medications - Monitor for complications of the blood thinner (see sheet)  Apply warm compresses twice daily  You can try to wrap the leg or wear compression stockings/socks to help with swelling and pain

## 2019-02-06 NOTE — ED Notes (Signed)
Patient transported to Ultrasound 

## 2019-02-06 NOTE — ED Triage Notes (Signed)
Pt c/o right LE pain since 4/9-had [place] 4/10-pt NAD-steady gait with own cane

## 2019-02-06 NOTE — ED Provider Notes (Signed)
[facility] EMERGENCY DEPARTMENT Provider Note   CSN: [id number] Arrival date & time: 02/06/19  1517    History   Chief Complaint Chief Complaint  Patient presents with   Leg Pain    HPI Eric Cohen is a 63 y.o. male.     HPI   63 yo M with PMHx DM, diverticulitis here with right leg pain. Pt reports that over the past 4-5 days, he's had progressively worsening sharp, stabbing R knee pain. He reports that the day before, he was doing yardwork and felt like his R knee was hurting. This is a chronic issue. He states he wore his R knee brace form old surgery throughout the day. He took it off at night, felt bette,r but since then has had aching, stabbing, sharp R calf pain. The pain is worse w/ even light touch, even just touching a blanket. No fever, chills. No redness or warmth. He had an u/s on 4/10 that was neg, has not had any improvement since then. No other complaints. No CP, SOB. No h/o DVT. The brace was able ot bend and he denies any prolonged immobilization.  Past Medical History:  Diagnosis Date   Asthma    Diabetes mellitus without complication (HCC)    Diverticulitis     There are no active problems to display for this patient.   Past Surgical History:  Procedure Laterality Date   APPENDECTOMY     CHOLECYSTECTOMY     COLOSTOMY REVERSAL     KNEE SURGERY          Home Medications    Prior to Admission medications   Medication Sig Start Date End Date Taking? Authorizing Provider  albuterol (PROVENTIL HFA;VENTOLIN HFA) 108 (90 BASE) MCG/ACT inhaler Inhale 2 puffs into the lungs every 6 (six) hours as needed for wheezing.    [provider]  ALBUTEROL IN Inhale into the lungs.    [provider]  azithromycin (ZITHROMAX Z-PAK) 250 MG tablet Per package instructions 02/18/13   Margarita Grizzle, MD  Budesonide-Formoterol Fumarate (SYMBICORT IN) Inhale into the lungs.    [provider]  HYDROcodone-acetaminophen  (NORCO/VICODIN) 5-325 MG per tablet Take 2 tablets by mouth every 4 (four) hours as needed. 11/02/13   Rolland Porter, MD  ondansetron (ZOFRAN ODT) 4 MG disintegrating tablet Take 1 tablet (4 mg total) by mouth every 8 (eight) hours as needed for nausea. 11/02/13   Rolland Porter, MD  oseltamivir (TAMIFLU) 75 MG capsule Take 1 capsule (75 mg total) by mouth every 12 (twelve) hours. 11/02/13   Rolland Porter, MD  oxyCODONE-acetaminophen (PERCOCET/ROXICET) 5-325 MG tablet Take 1-2 tablets by mouth every 6 (six) hours as needed for moderate pain or severe pain. 02/06/19   Shaune Pollack, MD  predniSONE (DELTASONE) 10 MG tablet Take 2 tablets (20 mg total) by mouth daily. 02/18/13   Margarita Grizzle, MD  Rivaroxaban 15 & 20 MG TBPK Take as directed: Start with one  tablet by mouth twice daily with food. On Day 22, switch to one  tablet once daily with food. 02/06/19   Shaune Pollack, MD    Family History No family history on file.  Social History Social History   Tobacco Use   Smoking status: Never Smoker   Smokeless tobacco: Never Used  Substance Use Topics   Alcohol use: No   Drug use: No     Allergies   Patient has no known allergies.   Review of Systems Review of Systems  Constitutional:  Positive for fatigue. Negative for chills and fever.  HENT: Negative for congestion and rhinorrhea.   Eyes: Negative for visual disturbance.  Respiratory: Negative for cough, shortness of breath and wheezing.   Cardiovascular: Positive for leg swelling. Negative for chest pain.  Gastrointestinal: Negative for abdominal pain, diarrhea, nausea and vomiting.  Genitourinary: Negative for dysuria and flank pain.  Musculoskeletal: Negative for neck pain and neck stiffness.  Skin: Negative for rash and wound.  Allergic/Immunologic: Negative for immunocompromised state.  Neurological: Positive for weakness. Negative for syncope and headaches.  All other systems reviewed and are negative.    Physical  Exam Updated Vital Signs BP (!) 144/80 (BP Location: Right Arm)    Pulse 79    Temp 98.5 F (36.9 C) (Oral)    Resp 14    Ht  (1.803 m)    Wt 110.2 kg    SpO2 97%    BMI 33.89 kg/m   Physical Exam Vitals signs and nursing note reviewed.  Constitutional:      General: He is not in acute distress.    Appearance: He is well-developed.  HENT:     Head: Normocephalic and atraumatic.  Eyes:     Conjunctiva/sclera: Conjunctivae normal.  Neck:     Musculoskeletal: Neck supple.  Cardiovascular:     Rate and Rhythm: Normal rate and regular rhythm.     Heart sounds: Normal heart sounds. No murmur. No friction rub.  Pulmonary:     Effort: Pulmonary effort is normal. No respiratory distress.     Breath sounds: Normal breath sounds. No wheezing or rales.  Abdominal:     General: There is no distension.     Palpations: Abdomen is soft.     Tenderness: There is no abdominal tenderness.  Skin:    General: Skin is warm.     Capillary Refill: Capillary refill takes less than 2 seconds.  Neurological:     Mental Status: He is alert and oriented to person, place, and time.     Motor: No abnormal muscle tone.     LOWER EXTREMITY EXAM: RIGHT  INSPECTION & PALPATION: Exquisite TTP along medial R calf, with marked allodynia.   SENSORY: sensation is intact to light touch in:  Superficial peroneal nerve distribution (over dorsum of foot) Deep peroneal nerve distribution (over first dorsal web space) Sural nerve distribution (over lateral aspect 5th metatarsal) Saphenous nerve distribution (over medial instep)  MOTOR:  + Motor EHL (great toe dorsiflexion) + FHL (great toe plantar flexion)  + TA (ankle dorsiflexion)  + GSC (ankle plantar flexion)  VASCULAR: 2+ dorsalis pedis and posterior tibialis pulses Capillary refill < 2 sec, toes warm and well-perfused  COMPARTMENTS: Soft, warm, well-perfused No pain with passive extension No parethesias   ED Treatments / Results   Labs (all labs ordered are listed, but only abnormal results are displayed) Labs Reviewed  BASIC METABOLIC PANEL - Abnormal; Notable for the following components:      Result Value   Glucose, Bld 287 (*)    All other components within normal limits  CBC - Abnormal; Notable for the following components:   Hemoglobin 12.7 (*)    HCT 37.6 (*)    RDW 17.2 (*)    All other components within normal limits    EKG None  Radiology Dg Tibia/fibula Right  Result Date: 02/06/2019 CLINICAL DATA:  63 year old male with right lower extremity calf pain since 04/09. No known injury. EXAM: RIGHT TIBIA AND FIBULA - 2 VIEW  COMPARISON:  None. FINDINGS: There is no evidence of fracture or other focal bone lesions. Soft tissues are unremarkable. IMPRESSION: Negative. Electronically Signed   By: Malachy Moan M.D.   On: 02/06/2019 17:17   US Venous Img Lower Unilateral Right  Result Date: 02/06/2019 CLINICAL DATA:  63 year old male with constant right calf pain for the past several days EXAM: RIGHT LOWER EXTREMITY VENOUS DOPPLER ULTRASOUND TECHNIQUE: Gray-scale sonography with graded compression, as well as color Doppler and duplex ultrasound were performed to evaluate the lower extremity deep venous systems from the level of the common femoral vein and including the common femoral, femoral, profunda femoral, popliteal and calf veins including the posterior tibial, peroneal and gastrocnemius veins when visible. The superficial great saphenous vein was also interrogated. Spectral Doppler was utilized to evaluate flow at rest and with distal augmentation maneuvers in the common femoral, femoral and popliteal veins. COMPARISON:  Prior duplex venous ultrasound 02/02/2019 FINDINGS: Contralateral Common Femoral Vein: Respiratory phasicity is normal and symmetric with the symptomatic side. No evidence of thrombus. Normal compressibility. Common Femoral Vein: No evidence of thrombus. Normal compressibility,  respiratory phasicity and response to augmentation. Saphenofemoral Junction: No evidence of thrombus. Normal compressibility and flow on color Doppler imaging. Profunda Femoral Vein: No evidence of thrombus. Normal compressibility and flow on color Doppler imaging. Femoral Vein: No evidence of thrombus. Normal compressibility, respiratory phasicity and response to augmentation. Popliteal Vein: No evidence of thrombus. Normal compressibility, respiratory phasicity and response to augmentation. Calf Veins: No evidence of thrombus within the posterior tibial and peroneal veins. However, within a deep gastrocnemius vein there is an echogenic filling defects suggesting nonocclusive thrombus. Superficial Great Saphenous Vein: No evidence of thrombus. Normal compressibility. Venous Reflux:  None. Other Findings:  None. IMPRESSION: 1. Nonocclusive echogenic thrombus within a deep gastrocnemius vein within the calf. Findings are consistent with isolated calf DVT. Given the sonographic appearance, a subacute or chronic nonocclusive thrombus is favored over acute DVT. 2. No evidence of deep venous thrombosis above the knee. Electronically Signed   By: Malachy Moan M.D.   On: 02/06/2019 16:49    Procedures Procedures (including critical care time)  Medications Ordered in ED Medications  oxyCODONE-acetaminophen (PERCOCET/ROXICET) 5-325 MG per tablet 2 tablet (has no administration in time range)  lidocaine (LIDODERM) 5 % 1 patch (1 patch Transdermal Not Given 02/06/19 1642)     Initial Impression / Assessment and Plan / ED Course  I have reviewed the triage vital signs and the nursing notes.  Pertinent labs & imaging results that were available during my care of the patient were reviewed by me and considered in my medical decision making (see chart for details).        63 yo M with PMHx as above here with recurrent R calf pain. This began after wearing his knee brace. DDx includes gastroc DVT missed on  prior U/S, calf strain/sprain, neuropraxia 2/2 brace use. No significant erythema or signs of cellulitis or abscess. U/S repeated, XR as well - interpreted by myself and radiology. Proximal gastroc DVT noted. While this is read as possibly subacute, given acuity of pain and sx, will tx as acute w/ brief course of Xarelto (likely 3 mo, first month RX'ed here w/ instructions and teaching by pharmacy). Will have him avoid additioanl immobilization, f/u with PCP as outpt. Discussed risks, benefits, return precautions w/ blood thinners. No CP, SOB, tachycardia, hypoxia, or signs of DVT/PE. No h/o bleeds.  Final Clinical Impressions(s) / ED Diagnoses   Final  diagnoses:  Acute deep vein thrombosis (DVT) of distal vein of right lower extremity [facility](HCC)    ED Discharge Orders         Ordered    Rivaroxaban 15 & 20 MG TBPK     02/06/19 1734    oxyCODONE-acetaminophen (PERCOCET/ROXICET) 5-325 MG tablet  Every 6 hours PRN     02/06/19 1734           Shaune PollackIsaacs, Hadeel Hillebrand, MD 02/06/19 1746

## 2019-02-28 ENCOUNTER — Encounter (HOSPITAL_BASED_OUTPATIENT_CLINIC_OR_DEPARTMENT_OTHER): Payer: Self-pay | Admitting: *Deleted

## 2019-02-28 ENCOUNTER — Emergency Department (HOSPITAL_BASED_OUTPATIENT_CLINIC_OR_DEPARTMENT_OTHER): Payer: 59

## 2019-02-28 ENCOUNTER — Emergency Department (HOSPITAL_BASED_OUTPATIENT_CLINIC_OR_DEPARTMENT_OTHER)
Admission: EM | Admit: 2019-02-28 | Discharge: 2019-02-28 | Disposition: A | Discharge status: Home / Self Care | Payer: 59 | Attending: Emergency Medicine | Admitting: Emergency Medicine

## 2019-02-28 ENCOUNTER — Other Ambulatory Visit: Payer: Self-pay

## 2019-02-28 DIAGNOSIS — R1033 Periumbilical pain: Secondary | ICD-10-CM | POA: Diagnosis present

## 2019-02-28 DIAGNOSIS — Z79899 Other long term (current) drug therapy: Secondary | ICD-10-CM | POA: Insufficient documentation

## 2019-02-28 DIAGNOSIS — L03311 Cellulitis of abdominal wall: Secondary | ICD-10-CM | POA: Insufficient documentation

## 2019-02-28 DIAGNOSIS — J45909 Unspecified asthma, uncomplicated: Secondary | ICD-10-CM | POA: Diagnosis not present

## 2019-02-28 DIAGNOSIS — E119 Type 2 diabetes mellitus without complications: Secondary | ICD-10-CM | POA: Diagnosis not present

## 2019-02-28 LAB — CBC WITH DIFFERENTIAL/PLATELET
Abs Immature Granulocytes: 0.04 10*3/uL (ref 0.00–0.07)
Basophils Absolute: 0.1 10*3/uL (ref 0.0–0.1)
Basophils Relative: 1 %
Eosinophils Absolute: 0.3 10*3/uL (ref 0.0–0.5)
Eosinophils Relative: 3 %
HCT: 36.3 % — ABNORMAL LOW (ref 39.0–52.0)
Hemoglobin: 12.1 g/dL — ABNORMAL LOW (ref 13.0–17.0)
Immature Granulocytes: 0 %
Lymphocytes Relative: 32 %
Lymphs Abs: 3.5 10*3/uL (ref 0.7–4.0)
MCH: 28.8 pg (ref 26.0–34.0)
MCHC: 33.3 g/dL (ref 30.0–36.0)
MCV: 86.4 fL (ref 80.0–100.0)
Monocytes Absolute: 0.9 10*3/uL (ref 0.1–1.0)
Monocytes Relative: 9 %
Neutro Abs: 6.1 10*3/uL (ref 1.7–7.7)
Neutrophils Relative %: 55 %
Platelets: 264 10*3/uL (ref 150–400)
RBC: 4.2 MIL/uL — ABNORMAL LOW (ref 4.22–5.81)
RDW: 16.4 % — ABNORMAL HIGH (ref 11.5–15.5)
WBC: 10.9 10*3/uL — ABNORMAL HIGH (ref 4.0–10.5)
nRBC: 0 % (ref 0.0–0.2)

## 2019-02-28 LAB — COMPREHENSIVE METABOLIC PANEL
ALT: 34 U/L (ref 0–44)
AST: 31 U/L (ref 15–41)
Albumin: 4 g/dL (ref 3.5–5.0)
Alkaline Phosphatase: 82 U/L (ref 38–126)
Anion gap: 7 (ref 5–15)
BUN: 14 mg/dL (ref 8–23)
CO2: 26 mmol/L (ref 22–32)
Calcium: 9 mg/dL (ref 8.9–10.3)
Chloride: 103 mmol/L (ref 98–111)
Creatinine, Ser: 1.28 mg/dL — ABNORMAL HIGH (ref 0.61–1.24)
GFR calc Af Amer: >60 mL/min (ref >60)
GFR calc non Af Amer: >60 mL/min (ref >60)
Glucose, Bld: 276 mg/dL — ABNORMAL HIGH (ref 70–99)
Potassium: 3.8 mmol/L (ref 3.5–5.1)
Sodium: 136 mmol/L (ref 135–145)
Total Bilirubin: 1.2 mg/dL (ref 0.3–1.2)
Total Protein: 7.5 g/dL (ref 6.5–8.1)

## 2019-02-28 MED ORDER — CEPHALEXIN 500 MG PO CAPS: 500.0000 mg | ORAL_CAPSULE | Freq: Four times a day (QID) | ORAL | 0 refills | Status: DC

## 2019-02-28 MED ORDER — IOHEXOL 300 MG/ML  SOLN
100.0000 mL | Freq: Once | INTRAMUSCULAR | Status: AC | PRN
  Administered 2019-02-28: 19:00:00 100 mL via INTRAVENOUS

## 2019-02-28 MED ORDER — CEPHALEXIN 250 MG PO CAPS
500.0000 mg | ORAL_CAPSULE | Freq: Once | ORAL | Status: AC
  Administered 2019-02-28: 20:00:00 500 mg via ORAL
  Filled 2019-02-28: qty 2

## 2019-02-28 NOTE — ED Triage Notes (Signed)
Abdominal pain started late last night and it just gets worst.  He thought it probably be the mesh that is placed on his belly.  Denies n/v, constipation.  He felt like his right side of his belly is hot and left side is cold.

## 2019-02-28 NOTE — Discharge Instructions (Addendum)
It appears as though there may be a skin infection called cellulitis. Please take all of your antibiotics until finished!   You may develop abdominal discomfort or diarrhea from the antibiotic.  You may help offset this with probiotics which you can buy or get in yogurt. Do not eat or take the probiotics until 2 hours after your antibiotic.   Follow up with a primary care provider or return to the ED in about 3 days for recheck of the region.  Return to the emergency department should you develop fever, worsening pain, swelling, spreading abdominal pain, or any other major concerns.

## 2019-02-28 NOTE — ED Notes (Signed)
Pt understood dc material. NAD noted. Script sent in electronically. All questions answered to satisfaction. Pt escorted to check out window. Provider aware of Vital signs prior to patient being discharged and leaving department.

## 2019-02-28 NOTE — ED Provider Notes (Signed)
[facility] EMERGENCY DEPARTMENT Provider Note   CSN: [id number] Arrival date & time: 02/28/19  1755    History   Chief Complaint Chief Complaint  Patient presents with  . Abdominal Pain    HPI Eric Cohen is a 63 y.o. male.     HPI   Eric Cohen is a 63 y.o. male, with a history of DM and asthma, presenting to the ED with abdominal pain beginning last night.  Pain is periumbilical, feels like a soreness or a sensitivity to the skin, states it feels as though it is superficial, pain arises with touching the skin or light palpation, otherwise pain-free.  He wonders if this could be irritation from hernia mesh placed about 18 years ago.  He also noted erythema in the area of irritation around his surgical scar.  Last bowel movement was this afternoon and was normal. Denies fever/chills, N/V/C/D, chest pain, shortness of breath, urinary symptoms, hematochezia/melena, wounds, trauma, or any other complaints.     Past Medical History:  Diagnosis Date  . Asthma   . Diabetes mellitus without complication (HCC)   . Diverticulitis     There are no active problems to display for this patient.   Past Surgical History:  Procedure Laterality Date  . APPENDECTOMY    . CHOLECYSTECTOMY    . COLOSTOMY REVERSAL    . KNEE SURGERY          Home Medications    Prior to Admission medications   Medication Sig Start Date End Date Taking? Authorizing Provider  Budesonide-Formoterol Fumarate (SYMBICORT IN) Inhale into the lungs.   Yes [provider]  Rivaroxaban 15 & 20 MG TBPK Take as directed: Start with one  tablet by mouth twice daily with food. On Day 22, switch to one  tablet once daily with food. 02/06/19  Yes Shaune Pollack, MD  albuterol (PROVENTIL HFA;VENTOLIN HFA) 108 (90 BASE) MCG/ACT inhaler Inhale 2 puffs into the lungs every 6 (six) hours as needed for wheezing.    [provider]  ALBUTEROL IN Inhale into the lungs.     [provider]  cephALEXin (KEFLEX) 500 MG capsule Take 1 capsule (500 mg total) by mouth 4 (four) times daily. 02/28/19   Joy, Hillard Danker, PA-C    Family History History reviewed. No pertinent family history.  Social History Social History   Tobacco Use  . Smoking status: Never Smoker  . Smokeless tobacco: Never Used  Substance Use Topics  . Alcohol use: No  . Drug use: No     Allergies   Patient has no known allergies.   Review of Systems Review of Systems  Respiratory: Negative for shortness of breath.   Cardiovascular: Negative for chest pain.  Gastrointestinal: Positive for abdominal pain. Negative for blood in stool, constipation, diarrhea, nausea and vomiting.  Genitourinary: Negative for dysuria, flank pain, hematuria and testicular pain.  Musculoskeletal: Negative for back pain.  Skin: Positive for color change. Negative for wound.     Physical Exam Updated Vital Signs BP (!) 158/78 (BP Location: Right Arm)   Pulse 85   Temp 98 F (36.7 C) (Oral)   Resp 16   Ht  (1.803 m)   Wt 102.1 kg   SpO2 98%   BMI 31.38 kg/m   Physical Exam Vitals signs and nursing note reviewed.  Constitutional:      General: He is not in acute distress.    Appearance: He is well-developed. He is not diaphoretic.  HENT:     Head: Normocephalic and atraumatic.     Mouth/Throat:     Mouth: Mucous membranes are moist.     Pharynx: Oropharynx is clear.  Eyes:     Conjunctiva/sclera: Conjunctivae normal.  Neck:     Musculoskeletal: Neck supple.  Cardiovascular:     Rate and Rhythm: Normal rate and regular rhythm.     Pulses: Normal pulses.     Heart sounds: Normal heart sounds.     Comments: Tactile temperature in the extremities appropriate and equal bilaterally. Pulmonary:     Effort: Pulmonary effort is normal. No respiratory distress.     Breath sounds: Normal breath sounds.  Abdominal:     Palpations: Abdomen is soft.     Tenderness: There is abdominal  tenderness. There is no guarding.     Comments: Mild erythema in the periumbilical region of the abdomen.  This area is tender and quite sensitive to the touch.  It does not require deep palpation to elicit pain. No evidence of peritonitis. When pressure is applied on the abdomen outside of the erythematous region, but with pressure directed towards the periumbilical abdomen, there is no pain.  There is only pain with palpation of the skin.  Musculoskeletal:     Right lower leg: No edema.     Left lower leg: No edema.  Lymphadenopathy:     Cervical: No cervical adenopathy.  Skin:    General: Skin is warm and dry.  Neurological:     Mental Status: He is alert.  Psychiatric:        Mood and Affect: Mood and affect normal.        Speech: Speech normal.        Behavior: Behavior normal.      ED Treatments / Results  Labs (all labs ordered are listed, but only abnormal results are displayed) Labs Reviewed  COMPREHENSIVE METABOLIC PANEL - Abnormal; Notable for the following components:      Result Value   Glucose, Bld 276 (*)    Creatinine, Ser 1.28 (*)    All other components within normal limits  CBC WITH DIFFERENTIAL/PLATELET - Abnormal; Notable for the following components:   WBC 10.9 (*)    RBC 4.20 (*)    Hemoglobin 12.1 (*)    HCT 36.3 (*)    RDW 16.4 (*)    All other components within normal limits   BUN  Date Value Ref Range Status  02/28/2019 14 8 - 23 mg/dL Final  56/43/3295 13 8 - 23 mg/dL Final  18/84/1660 12 6 - 23 mg/dL Final   Creatinine, Ser  Date Value Ref Range Status  02/28/2019 1.28 (H) 0.61 - 1.24 mg/dL Final  63/10/6008 9.32 0.61 - 1.24 mg/dL Final  35/57/3220 2.54 0.50 - 1.35 mg/dL Final      EKG None  Radiology Ct Abdomen Pelvis W Contrast  Result Date: 02/28/2019 CLINICAL DATA:  Abdominal pain getting worse since yesterday. EXAM: CT ABDOMEN AND PELVIS WITH CONTRAST TECHNIQUE: Multidetector CT imaging of the abdomen and pelvis was performed  using the standard protocol following bolus administration of intravenous contrast. CONTRAST:  OMNIPAQUE IOHEXOL 300 MG/ML  SOLN COMPARISON:  02/21/2006 FINDINGS: Lower chest: Unremarkable. Hepatobiliary: The liver shows diffusely decreased attenuation suggesting steatosis. Gallbladder is surgically absent. No intrahepatic or extrahepatic biliary dilation. Pancreas: No focal mass lesion. No dilatation of the main duct. No intraparenchymal cyst. No peripancreatic edema. Spleen: No splenomegaly. No focal mass lesion. Adrenals/Urinary Tract: No adrenal  nodule or mass. Tiny caliceal diverticulum noted upper pole right kidney. Kidneys otherwise unremarkable. No evidence for hydroureter. The urinary bladder appears normal for the degree of distention. Stomach/Bowel: Stomach is unremarkable. No gastric wall thickening. No evidence of outlet obstruction. Duodenum is normally positioned as is the ligament of Treitz. No small bowel wall thickening. No small bowel dilatation. The terminal ileum is normal. Short appendiceal stump evident. No gross colonic mass. No colonic wall thickening. Diverticuli are seen scattered along the entire length of the colon without CT findings of diverticulitis. Vascular/Lymphatic: There is abdominal aortic atherosclerosis without aneurysm. There is no gastrohepatic or hepatoduodenal ligament lymphadenopathy. No intraperitoneal or retroperitoneal lymphadenopathy. No pelvic sidewall lymphadenopathy. Reproductive: The prostate gland and seminal vesicles are unremarkable. Other: Amorphous soft tissue attenuation in the left groin is stable in suggest prior herniorrhaphy. No intraperitoneal free fluid. Musculoskeletal: No worrisome lytic or sclerotic osseous abnormality. Mild skin thickening in the region of the umbilicus is indeterminate. IMPRESSION: No acute findings in the abdomen or pelvis. No findings to explain the patient's history of pain. Diffuse colonic diverticulosis without  diverticulitis. Hepatic steatosis. Mild skin thickening at the umbilicus. This should be amenable to clinical inspection. Electronically Signed   By: Eric CenterEric  Mansell M.D.   On: 02/28/2019 19:33    Procedures Procedures (including critical care time)  Medications Ordered in ED Medications  cephALEXin (KEFLEX) capsule 500 mg (has no administration in time range)  iohexol (OMNIPAQUE) 300 MG/ML solution 100 mL (100 mLs Intravenous Contrast Given 02/28/19 1909)     Initial Impression / Assessment and Plan / ED Course  I have reviewed the triage vital signs and the nursing notes.  Pertinent labs & imaging results that were available during my care of the patient were reviewed by me and considered in my medical decision making (see chart for details).        Patient presents with irritation to the abdomen. Patient is nontoxic appearing, afebrile, not tachycardic, not tachypneic, not hypotensive, maintains excellent SPO2 on room air, and is in no apparent distress.  Mild leukocytosis of 10.9. Area of skin thickening on CT, otherwise unremarkable.  Suspicion for cellulitis.  He will have area rechecked within the next few days to assure improvement. The patient was given instructions for home care as well as return precautions. Patient voices understanding of these instructions, accepts the plan, and is comfortable with discharge.    Findings and plan of care discussed with Meridee ScoreMichael Butler, MD.    Vitals:   02/28/19 1812 02/28/19 1815 02/28/19 2013  BP: (!) 158/78  140/82  Pulse: 85  80  Resp: 16  18  Temp: 98 F (36.7 C)    TempSrc: Oral    SpO2: 98%  99%  Weight:  102.1 kg   Height:  5\' 11"  (1.803 m)      Final Clinical Impressions(s) / ED Diagnoses   Final diagnoses:  Abdominal wall cellulitis    ED Discharge Orders         Ordered    cephALEXin (KEFLEX) 500 MG capsule  4 times daily     02/28/19 2017           Concepcion LivingJoy, Shawn C, PA-C 02/28/19 2021    Terrilee FilesButler, Michael  C, MD 03/01/19 [phone]

## 2019-02-28 NOTE — ED Notes (Signed)
ED Provider at bedside. 

## 2019-04-23 ENCOUNTER — Emergency Department (HOSPITAL_BASED_OUTPATIENT_CLINIC_OR_DEPARTMENT_OTHER): Payer: 59

## 2019-04-23 ENCOUNTER — Other Ambulatory Visit: Payer: Self-pay

## 2019-04-23 ENCOUNTER — Emergency Department (HOSPITAL_BASED_OUTPATIENT_CLINIC_OR_DEPARTMENT_OTHER)
Admission: EM | Admit: 2019-04-23 | Discharge: 2019-04-23 | Disposition: A | Discharge status: Home / Self Care | Payer: 59 | Attending: Emergency Medicine | Admitting: Emergency Medicine

## 2019-04-23 ENCOUNTER — Encounter (HOSPITAL_BASED_OUTPATIENT_CLINIC_OR_DEPARTMENT_OTHER): Payer: Self-pay | Admitting: *Deleted

## 2019-04-23 DIAGNOSIS — J45909 Unspecified asthma, uncomplicated: Secondary | ICD-10-CM | POA: Insufficient documentation

## 2019-04-23 DIAGNOSIS — M79606 Pain in leg, unspecified: Secondary | ICD-10-CM | POA: Diagnosis present

## 2019-04-23 DIAGNOSIS — Z86718 Personal history of other venous thrombosis and embolism: Secondary | ICD-10-CM

## 2019-04-23 DIAGNOSIS — M79604 Pain in right leg: Secondary | ICD-10-CM | POA: Insufficient documentation

## 2019-04-23 DIAGNOSIS — Z7901 Long term (current) use of anticoagulants: Secondary | ICD-10-CM | POA: Insufficient documentation

## 2019-04-23 DIAGNOSIS — M79605 Pain in left leg: Secondary | ICD-10-CM | POA: Insufficient documentation

## 2019-04-23 DIAGNOSIS — E119 Type 2 diabetes mellitus without complications: Secondary | ICD-10-CM | POA: Insufficient documentation

## 2019-04-23 HISTORY — DX: Acute embolism and thrombosis of unspecified deep veins of unspecified lower extremity: I82.409

## 2019-04-23 MED ORDER — RIVAROXABAN (XARELTO) VTE STARTER PACK (15 & 20 MG): ORAL_TABLET | ORAL | 0 refills | Status: DC

## 2019-04-23 MED FILL — XARELTO 20 MG TABLET: 20 | 30 days supply | Qty: 30 | Fill #0

## 2019-04-23 NOTE — Discharge Instructions (Addendum)
Stop Lovenox.

## 2019-04-23 NOTE — ED Notes (Signed)
ED Provider at bedside. 

## 2019-04-23 NOTE — ED Provider Notes (Signed)
[facility] DEPARTMENT Provider Note   CSN: [id number] Arrival date & time: 04/23/19  6433    History   Chief Complaint Chief Complaint  Patient presents with  . Leg Pain    HPI Eric Cohen is a 63 y.o. male.     Pt presents to the ED today with bilateral leg pain.  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  He was seen here in April and was put on Xarelto.  The pt said he called the [facility] for a refill and they gave him a rx for lovenox.  The pt has not skipped any doses of blood thinners.  The pt also c/o left leg pain.  The pt denies sob or cp.  No f/c.  No cough.     Past Medical History:  Diagnosis Date  . Asthma   . Diabetes mellitus without complication ([place])   . Diverticulitis   . DVT (deep venous thrombosis) ([place])     There are no active problems to display for this patient.   Past Surgical History:  Procedure Laterality Date  . APPENDECTOMY    . CHOLECYSTECTOMY    . COLOSTOMY REVERSAL    . KNEE SURGERY          Home Medications    Prior to Admission medications   Medication Sig Start Date End Date Taking? Authorizing Provider  enoxaparin (LOVENOX) 150 MG/ML injection Inject 150 mg into the skin.   Yes [provider]  albuterol (PROVENTIL HFA;VENTOLIN HFA) 108 (90 BASE) MCG/ACT inhaler Inhale 2 puffs into the lungs every 6 (six) hours as needed for wheezing.    [provider]  ALBUTEROL IN Inhale into the lungs.    [provider]  Budesonide-Formoterol Fumarate (SYMBICORT IN) Inhale into the lungs.    [provider]  Rivaroxaban 15 & 20 MG TBPK Take 20 mg daily 04/23/19   Isla Pence, MD    Family History Family History  Problem Relation Age of Onset  . Heart failure Mother     Social History Social History   Tobacco Use  . Smoking status: Never Smoker  . Smokeless tobacco: Never Used  Substance Use Topics  . Alcohol use: No  . Drug use: No     Allergies    Shellfish allergy   Review of Systems Review of Systems  Musculoskeletal:       Bilateral leg pain  All other systems reviewed and are negative.    Physical Exam Updated Vital Signs BP (!) 142/88 (BP Location: Right Arm)   Pulse 84   Temp 98.4 F (36.9 C) (Oral)   Resp 16   Ht 5\' 11"  (1.803 m)   Wt 102.1 kg   SpO2 95%   BMI 31.38 kg/m   Physical Exam Vitals signs and nursing note reviewed.  Constitutional:      Appearance: Normal appearance.  HENT:     Head: Normocephalic and atraumatic.     Right Ear: External ear normal.     Left Ear: External ear normal.     Nose: Nose normal.     Mouth/Throat:     Mouth: Mucous membranes are moist.     Pharynx: Oropharynx is clear.  Eyes:     Extraocular Movements: Extraocular movements intact.     Conjunctiva/sclera: Conjunctivae normal.     Pupils: Pupils are equal, round, and reactive to light.  Neck:     Musculoskeletal: Normal range of  motion and neck supple.  Cardiovascular:     Rate and Rhythm: Normal rate and regular rhythm.     Pulses: Normal pulses.     Heart sounds: Normal heart sounds.  Pulmonary:     Effort: Pulmonary effort is normal.     Breath sounds: Normal breath sounds.  Abdominal:     General: Abdomen is flat. Bowel sounds are normal.  Musculoskeletal:     Right knee: Tenderness found.     Right lower leg: Edema present.     Left lower leg: Edema present.  Skin:    General: Skin is warm.     Capillary Refill: Capillary refill takes less than 2 seconds.  Neurological:     General: No focal deficit present.     Mental Status: He is alert and oriented to person, place, and time.  Psychiatric:        Mood and Affect: Mood normal.        Behavior: Behavior normal.      ED Treatments / Results  Labs (all labs ordered are listed, but only abnormal results are displayed) Labs Reviewed - No data to display  EKG None  Radiology [place] Venous Img Lower Bilateral  Result Date: 04/23/2019 CLINICAL  DATA:  Bilateral lower extremity pain. History DVT. Evaluate for acute chronic DVT. EXAM: BILATERAL LOWER EXTREMITY VENOUS DOPPLER ULTRASOUND TECHNIQUE: Gray-scale sonography with graded compression, as well as color Doppler and duplex ultrasound were performed to evaluate the lower extremity deep venous systems from the level of the common femoral vein and including the common femoral, femoral, profunda femoral, popliteal and calf veins including the posterior tibial, peroneal and gastrocnemius veins when visible. The superficial great saphenous vein was also interrogated. Spectral Doppler was utilized to evaluate flow at rest and with distal augmentation maneuvers in the common femoral, femoral and popliteal veins. COMPARISON:  None. FINDINGS: RIGHT LOWER EXTREMITY Common Femoral Vein: No evidence of thrombus. Normal compressibility, respiratory phasicity and response to augmentation. Saphenofemoral Junction: No evidence of thrombus. Normal compressibility and flow on color Doppler imaging. Profunda Femoral Vein: No evidence of thrombus. Normal compressibility and flow on color Doppler imaging. Femoral Vein: No evidence of thrombus. Normal compressibility, respiratory phasicity and response to augmentation. Popliteal Vein: No evidence of thrombus. Normal compressibility, respiratory phasicity and response to augmentation. Calf Veins: No evidence of thrombus. Normal compressibility and flow on color Doppler imaging. Superficial Great Saphenous Vein: There is a minimal amount of echogenic nonocclusive wall thickening involving the greater saphenous vein at the level of the right ankle (image 65) and calf (image 66. Other Findings: There is a minimal amount echogenic wall thickening involving the right lesser saphenous vein (image 69). _________________________________________________________ LEFT LOWER EXTREMITY Common Femoral Vein: No evidence of thrombus. Normal compressibility, respiratory phasicity and response  to augmentation. Saphenofemoral Junction: No evidence of thrombus. Normal compressibility and flow on color Doppler imaging. Profunda Femoral Vein: No evidence of thrombus. Normal compressibility and flow on color Doppler imaging. Femoral Vein: No evidence of thrombus. Normal compressibility, respiratory phasicity and response to augmentation. Popliteal Vein: No evidence of thrombus. Normal compressibility, respiratory phasicity and response to augmentation. Calf Veins: No evidence of thrombus. Normal compressibility and flow on color Doppler imaging. Superficial Great Saphenous Vein: No evidence of thrombus. Normal compressibility. Venous Reflux:  None. Other Findings: Suspected small knee joint effusion (images 35 and 36). IMPRESSION: 1. No evidence of acute or chronic DVT within either lower extremity. 2. Nonocclusive echogenic wall thickening involving the distal aspect of  the right greater saphenous vein as well as the right lesser saphenous vein, both of which are favored to represent the sequela of prior superficial thrombophlebitis. No evidence of acute occlusive SVT within either lower extremity. Electronically Signed   By: Simonne ComeJohn  Watts M.D.   On: 04/23/2019 11:19   Dg Knee Complete 4 Views Right  Result Date: 04/23/2019 CLINICAL DATA:  Pain EXAM: RIGHT KNEE - COMPLETE 4+ VIEW COMPARISON:  None. FINDINGS: Frontal, lateral, and bilateral oblique views were obtained. No fracture or dislocation. No joint effusion. Joint spaces appear normal. No erosive change. IMPRESSION: No fracture or dislocation. No evident joint effusion. No appreciable arthropathy. Electronically Signed   By: Bretta BangWilliam  Woodruff III M.D.   On: 04/23/2019 10:18    Procedures Procedures (including critical care time)  Medications Ordered in ED Medications - No data to display   Initial Impression / Assessment and Plan / ED Course  I have reviewed the triage vital signs and the nursing notes.  Pertinent labs & imaging results  that were available during my care of the patient were reviewed by me and considered in my medical decision making (see chart for details).     Pt does not have an acute DVT.  He requests a rx for Xarelto.  He has been on it, so I refilled it for 20 mg daily.  He knows to return if worse.  F/u with his pcp.  Final Clinical Impressions(s) / ED Diagnoses   Final diagnoses:  Bilateral leg pain  History of DVT (deep vein thrombosis)    ED Discharge Orders         Ordered    Rivaroxaban 15 & 20 MG TBPK     04/23/19 1137           Jacalyn LefevreHaviland, Herny Scurlock, MD 04/23/19 1139

## 2019-04-23 NOTE — ED Triage Notes (Signed)
Right leg pain that starts from the knee radiating to his inner thigh that restarted last Friday.  Had a blood clot and was placed on Lovenox prescribed by the [facility].  Tingling sensation to left leg.

## 2019-08-09 ENCOUNTER — Other Ambulatory Visit: Payer: Self-pay

## 2019-08-09 ENCOUNTER — Encounter: Payer: Self-pay | Admitting: Cardiology

## 2019-08-09 ENCOUNTER — Ambulatory Visit (INDEPENDENT_AMBULATORY_CARE_PROVIDER_SITE_OTHER): Payer: 59 | Admitting: Cardiology

## 2019-08-09 VITALS — BP 142/85 | HR 81 | Temp 97.7°F | Ht 71.5 in | Wt 243.0 lb

## 2019-08-09 DIAGNOSIS — Z86718 Personal history of other venous thrombosis and embolism: Secondary | ICD-10-CM | POA: Diagnosis not present

## 2019-08-09 DIAGNOSIS — I10 Essential (primary) hypertension: Secondary | ICD-10-CM | POA: Diagnosis not present

## 2019-08-09 DIAGNOSIS — I209 Angina pectoris, unspecified: Secondary | ICD-10-CM

## 2019-08-09 DIAGNOSIS — Z794 Long term (current) use of insulin: Secondary | ICD-10-CM

## 2019-08-09 DIAGNOSIS — E119 Type 2 diabetes mellitus without complications: Secondary | ICD-10-CM | POA: Diagnosis not present

## 2019-08-09 MED ORDER — NITROGLYCERIN 0.4 MG SL SUBL: 0.4000 mg | SUBLINGUAL_TABLET | SUBLINGUAL | 3 refills | Status: DC | PRN

## 2019-08-09 MED ORDER — ASPIRIN EC 81 MG PO TBEC: 81.0000 mg | DELAYED_RELEASE_TABLET | Freq: Every day | ORAL | 1 refills | Status: DC

## 2019-08-09 NOTE — Progress Notes (Signed)
Primary Physician:  Lin Landsman, MD   Patient ID: Eric Cohen, male    DOB: [birth year], 63 y.o.   MRN: [id number]  Subjective:    Chief Complaint  Patient presents with  . Hypertension  . Diabetes  . New Patient (Initial Visit)    HPI: Eric Cohen  is a 63 y.o. male  with history of right DVT in April 2020 treated with Xarelto, type 2 diabetes, referred to [facility] on a Urgent basis by Dr. Lin Landsman for evaluation of hypertension and chest pain.  Patient reports that 3 days ago, he had an episode of chest pain while driving that was pretty severe. Pain radiated down his left arm. He pulled over and states pain eventually eased off. The next day, he again had an episode of right sided chest pain with pain radiation down his right arm that awoke him from sleep at around 2 am. Resolved after getting up and taking aspirin. He reports one other similar episode the next day. He decided to see his PCP, blood pressure was markedly elevated; therefore, advised cardiology evaluation.  He states that he has shortness of breath from asthma that is unchanged. No leg edema, palpitations, syncope, or symptoms of claudication or TIA. States that his DVT occurred after wearing a knee brace, but was not signifcantly sedentary during that time or had traveled long distances. No recurrence. He states that he had treadmill stress test last year at the [facility] that was normal.  He states that he is fairly active with yard work and gardening that he tolerates well. He is unsure of any family history of heart disease. States that his father died in his sleep, unsure of cause, not at a early age. Patient was in the [employer] for 13 years.   Past Medical History:  Diagnosis Date  . Asthma   . Diabetes mellitus without complication ([place])   . Diverticulitis   . DVT (deep venous thrombosis) ([place])     Past Surgical History:  Procedure Laterality Date  . APPENDECTOMY    . CHOLECYSTECTOMY    . COLOSTOMY REVERSAL    .  KNEE SURGERY      Social History   Socioeconomic History  . Marital status: Married    Spouse name: Not on file  . Number of children: 1  . Years of education: Not on file  . Highest education level: Not on file  Occupational History  . Not on file  Social Needs  . Financial resource strain: Not on file  . Food insecurity    Worry: Not on file    Inability: Not on file  . Transportation needs    Medical: Not on file    Non-medical: Not on file  Tobacco Use  . Smoking status: Never Smoker  . Smokeless tobacco: Never Used  Substance and Sexual Activity  . Alcohol use: No  . Drug use: No  . Sexual activity: Not on file  Lifestyle  . Physical activity    Days per week: Not on file    Minutes per session: Not on file  . Stress: Not on file  Relationships  . Social Herbalist on phone: Not on file    Gets together: Not on file    Attends religious service: Not on file    Active member of club or organization: Not on file    Attends meetings of clubs or organizations: Not on file    Relationship status: Not  on file  . Intimate partner violence    Fear of current or ex partner: Not on file    Emotionally abused: Not on file    Physically abused: Not on file    Forced sexual activity: Not on file  Other Topics Concern  . Not on file  Social History Narrative  . Not on file    Review of Systems  Constitution: Negative for decreased appetite, malaise/fatigue, weight gain and weight loss.  Eyes: Negative for visual disturbance.  Cardiovascular: Negative for chest pain, claudication, dyspnea on exertion, leg swelling, orthopnea, palpitations and syncope.  Respiratory: Negative for hemoptysis and wheezing.   Endocrine: Negative for cold intolerance and heat intolerance.  Hematologic/Lymphatic: Does not bruise/bleed easily.  Skin: Negative for nail changes.  Musculoskeletal: Negative for muscle weakness and myalgias.  Gastrointestinal: Negative for abdominal  pain, change in bowel habit, nausea and vomiting.  Neurological: Negative for difficulty with concentration, dizziness, focal weakness and headaches.  Psychiatric/Behavioral: Negative for altered mental status and suicidal ideas.  All other systems reviewed and are negative.     Objective:  Blood pressure (!) 142/85, pulse 81, temperature 97.7 F (36.5 C), height 5' 11.5" (1.816 m), weight 243 lb (110.2 kg), SpO2 97 %. Body mass index is 33.42 kg/m.    Physical Exam  Constitutional: He is oriented to person, place, and time. Vital signs are normal. He appears well-developed and well-nourished.  HENT:  Head: Normocephalic and atraumatic.  Neck: Normal range of motion.  Cardiovascular: Normal rate, regular rhythm, normal heart sounds and intact distal pulses.  Pulmonary/Chest: Effort normal and breath sounds normal. No accessory muscle usage. No respiratory distress.  Abdominal: Soft. Bowel sounds are normal.  Musculoskeletal: Normal range of motion.  Neurological: He is alert and oriented to person, place, and time.  Skin: Skin is warm and dry.  Vitals reviewed.  Radiology: No results found.  Laboratory examination:   11/09/2018: Cholesterol 144, triglycerides 109, HDL 46, LDL 79.  CMP Latest Ref Rng & Units 02/28/2019 02/06/2019 03/12/2013  Glucose 70 - 99 mg/dL 161(W276(H) 960(A287(H) 540(J143(H)  BUN 8 - 23 mg/dL 14 13 12   Creatinine 0.61 - 1.24 mg/dL 8.11(B1.28(H) 1.471.14 8.291.10  Sodium 135 - 145 mmol/L 136 135 140  Potassium 3.5 - 5.1 mmol/L 3.8 4.0 3.9  Chloride 98 - 111 mmol/L 103 102 103  CO2 22 - 32 mmol/L 26 26 28   Calcium 8.9 - 10.3 mg/dL 9.0 9.0 9.3  Total Protein 6.5 - 8.1 g/dL 7.5 - -  Total Bilirubin 0.3 - 1.2 mg/dL 1.2 - -  Alkaline Phos 38 - 126 U/L 82 - -  AST 15 - 41 U/L 31 - -  ALT 0 - 44 U/L 34 - -   CBC Latest Ref Rng & Units 02/28/2019 02/06/2019 03/12/2013  WBC 4.0 - 10.5 K/uL 10.9(H) 9.6 9.3  Hemoglobin 13.0 - 17.0 g/dL 12.1(L) 12.7(L) 12.7(L)  Hematocrit 39.0 - 52.0 % 36.3(L)  37.6(L) 35.9(L)  Platelets 150 - 400 K/uL 264 197 231   Lipid Panel  No results found for: CHOL, TRIG, HDL, CHOLHDL, VLDL, LDLCALC, LDLDIRECT HEMOGLOBIN A1C No results found for: HGBA1C, MPG TSH No results for input(s): TSH in the last 8760 hours.  PRN Meds:. Medications Discontinued During This Encounter  Medication Reason  . enoxaparin (LOVENOX) 150 MG/ML injection Error  . Rivaroxaban 15 & 20 MG TBPK Error   Current Meds  Medication Sig  . albuterol (PROVENTIL HFA;VENTOLIN HFA) 108 (90 BASE) MCG/ACT inhaler Inhale 2 puffs  into the lungs every 6 (six) hours as needed for wheezing.  . ALBUTEROL IN Inhale into the lungs.  . Budesonide-Formoterol Fumarate (SYMBICORT IN) Inhale into the lungs.  . insulin glargine (LANTUS) 100 UNIT/ML injection Inject 68 Units into the skin at bedtime.    Cardiac Studies:     Assessment:   Angina pectoris (HCC) - Plan: PCV ECHOCARDIOGRAM COMPLETE, PCV MYOCARDIAL PERFUSION WO LEXISCAN  Essential hypertension - Plan: EKG 12-Lead, PCV ECHOCARDIOGRAM COMPLETE  History of DVT (deep vein thrombosis)  Type 2 diabetes mellitus without complication, with long-term current use of insulin (HCC)  EKG 08/09/2019: Normal sinus rhythm at 80 bpm, normal axis, possible old anterior infarct. T wave flattening in inferolateral leads, cannot exclude ischemia.   Recommendations:   Patient has recently began having episodes concerning for angina.  Lipids appear to be well controlled, but it does not appear that he is on medication for the same.  He did not bring a list of his medications, but later called our office and clarified this.  If he is not on statin therapy, will plan on only starting him on low-dose statin in view of his diabetes.  In view of his diabetes age, and being African-American, do feel that he is higher risk for CAD.  We will schedule for exercise nuclear stress testing for further evaluation.  I have given him a prescription for nitroglycerin  and advised how to use.  He will also need echocardiogram to exclude any structural abnormalities.  His blood pressure was significantly elevated in PCP office, but is much better in our office.  We are also unsure if he is on antihypertensive therapy which will be clarified when he later called our office.  Depending upon this, may make further recommendations.  Advised him to take it easy until he is seen by me.  I will start him on 81 mg aspirin.  No history of GI bleeding.  We will see him back after the test for further recommendations and reevaluation.   *I have discussed this case with Dr. Jacinto Halim and he personally examined the patient and participated in formulating the plan.*   Toniann Fail, MSN, APRN, FNP-C [facility] Office: [phone] Fax: [phone]

## 2019-08-21 ENCOUNTER — Ambulatory Visit (INDEPENDENT_AMBULATORY_CARE_PROVIDER_SITE_OTHER): Payer: 59

## 2019-08-21 ENCOUNTER — Other Ambulatory Visit: Payer: Self-pay

## 2019-08-21 DIAGNOSIS — I209 Angina pectoris, unspecified: Secondary | ICD-10-CM

## 2019-08-21 DIAGNOSIS — I10 Essential (primary) hypertension: Secondary | ICD-10-CM | POA: Diagnosis not present

## 2019-08-30 ENCOUNTER — Observation Stay (HOSPITAL_BASED_OUTPATIENT_CLINIC_OR_DEPARTMENT_OTHER)
Admission: EM | Admit: 2019-08-30 | Discharge: 2019-09-01 | Disposition: A | Discharge status: Home / Self Care | Payer: 59 | Attending: Cardiology | Admitting: Cardiology

## 2019-08-30 ENCOUNTER — Encounter (HOSPITAL_BASED_OUTPATIENT_CLINIC_OR_DEPARTMENT_OTHER): Payer: Self-pay

## 2019-08-30 ENCOUNTER — Other Ambulatory Visit: Payer: Self-pay

## 2019-08-30 ENCOUNTER — Emergency Department (HOSPITAL_BASED_OUTPATIENT_CLINIC_OR_DEPARTMENT_OTHER): Payer: 59

## 2019-08-30 DIAGNOSIS — Z20828 Contact with and (suspected) exposure to other viral communicable diseases: Secondary | ICD-10-CM | POA: Insufficient documentation

## 2019-08-30 DIAGNOSIS — Z79899 Other long term (current) drug therapy: Secondary | ICD-10-CM | POA: Insufficient documentation

## 2019-08-30 DIAGNOSIS — Z7982 Long term (current) use of aspirin: Secondary | ICD-10-CM | POA: Diagnosis not present

## 2019-08-30 DIAGNOSIS — E785 Hyperlipidemia, unspecified: Secondary | ICD-10-CM | POA: Diagnosis not present

## 2019-08-30 DIAGNOSIS — E119 Type 2 diabetes mellitus without complications: Secondary | ICD-10-CM | POA: Diagnosis not present

## 2019-08-30 DIAGNOSIS — Z7902 Long term (current) use of antithrombotics/antiplatelets: Secondary | ICD-10-CM | POA: Insufficient documentation

## 2019-08-30 DIAGNOSIS — I10 Essential (primary) hypertension: Secondary | ICD-10-CM | POA: Diagnosis not present

## 2019-08-30 DIAGNOSIS — E669 Obesity, unspecified: Secondary | ICD-10-CM | POA: Diagnosis not present

## 2019-08-30 DIAGNOSIS — R0600 Dyspnea, unspecified: Secondary | ICD-10-CM | POA: Diagnosis not present

## 2019-08-30 DIAGNOSIS — J45909 Unspecified asthma, uncomplicated: Secondary | ICD-10-CM | POA: Insufficient documentation

## 2019-08-30 DIAGNOSIS — I25110 Atherosclerotic heart disease of native coronary artery with unstable angina pectoris: Secondary | ICD-10-CM | POA: Diagnosis not present

## 2019-08-30 DIAGNOSIS — I200 Unstable angina: Secondary | ICD-10-CM

## 2019-08-30 DIAGNOSIS — Z86718 Personal history of other venous thrombosis and embolism: Secondary | ICD-10-CM | POA: Diagnosis not present

## 2019-08-30 DIAGNOSIS — Z6833 Body mass index (BMI) 33.0-33.9, adult: Secondary | ICD-10-CM | POA: Diagnosis not present

## 2019-08-30 DIAGNOSIS — Z794 Long term (current) use of insulin: Secondary | ICD-10-CM | POA: Diagnosis not present

## 2019-08-30 DIAGNOSIS — R0789 Other chest pain: Secondary | ICD-10-CM | POA: Diagnosis present

## 2019-08-30 HISTORY — DX: Essential (primary) hypertension: I10

## 2019-08-30 LAB — CBC
HCT: 39.5 % (ref 39.0–52.0)
Hemoglobin: 13.6 g/dL (ref 13.0–17.0)
MCH: 30.4 pg (ref 26.0–34.0)
MCHC: 34.4 g/dL (ref 30.0–36.0)
MCV: 88.2 fL (ref 80.0–100.0)
Platelets: 246 10*3/uL (ref 150–400)
RBC: 4.48 MIL/uL (ref 4.22–5.81)
RDW: 17.2 % — ABNORMAL HIGH (ref 11.5–15.5)
WBC: 11.8 10*3/uL — ABNORMAL HIGH (ref 4.0–10.5)
nRBC: 0 % (ref 0.0–0.2)

## 2019-08-30 LAB — BASIC METABOLIC PANEL
Anion gap: 10 (ref 5–15)
BUN: 14 mg/dL (ref 8–23)
CO2: 24 mmol/L (ref 22–32)
Calcium: 9 mg/dL (ref 8.9–10.3)
Chloride: 101 mmol/L (ref 98–111)
Creatinine, Ser: 1 mg/dL (ref 0.61–1.24)
GFR calc Af Amer: >60 mL/min (ref >60)
GFR calc non Af Amer: >60 mL/min (ref >60)
Glucose, Bld: 254 mg/dL — ABNORMAL HIGH (ref 70–99)
Potassium: 4.1 mmol/L (ref 3.5–5.1)
Sodium: 135 mmol/L (ref 135–145)

## 2019-08-30 LAB — TROPONIN I (HIGH SENSITIVITY)
Troponin I (High Sensitivity): 3 ng/L (ref <18)
Troponin I (High Sensitivity): 4 ng/L (ref <18)

## 2019-08-30 LAB — GLUCOSE, CAPILLARY: Glucose-Capillary: 211 mg/dL — ABNORMAL HIGH (ref 70–99)

## 2019-08-30 LAB — CBG MONITORING, ED: Glucose-Capillary: 217 mg/dL — ABNORMAL HIGH (ref 70–99)

## 2019-08-30 MED ORDER — IOHEXOL 350 MG/ML SOLN
100.0000 mL | Freq: Once | INTRAVENOUS | Status: AC | PRN
  Administered 2019-08-30: 100 mL via INTRAVENOUS

## 2019-08-30 MED ORDER — HEPARIN BOLUS VIA INFUSION
4000.0000 [IU] | Freq: Once | INTRAVENOUS | Status: AC
  Administered 2019-08-30: 4000 [IU] via INTRAVENOUS

## 2019-08-30 MED ORDER — ASPIRIN 300 MG RE SUPP: 300.0000 mg | RECTAL | Status: DC

## 2019-08-30 MED ORDER — NITROGLYCERIN 0.4 MG SL SUBL
0.4000 mg | SUBLINGUAL_TABLET | SUBLINGUAL | Status: DC | PRN
  Administered 2019-08-30 (×2): 0.4 mg via SUBLINGUAL
  Filled 2019-08-30 (×2): qty 1

## 2019-08-30 MED ORDER — ASPIRIN 81 MG PO CHEW
324.0000 mg | CHEWABLE_TABLET | ORAL | Status: AC
  Administered 2019-08-30: 22:00:00 324 mg via ORAL
  Filled 2019-08-30: qty 4

## 2019-08-30 MED ORDER — ONDANSETRON HCL 4 MG/2ML IJ SOLN
4.0000 mg | Freq: Four times a day (QID) | INTRAMUSCULAR | Status: DC | PRN
  Administered 2019-08-31: 4 mg via INTRAVENOUS
  Filled 2019-08-30: qty 2

## 2019-08-30 MED ORDER — NITROGLYCERIN IN D5W 200-5 MCG/ML-% IV SOLN
0.0000 ug/min | INTRAVENOUS | Status: DC
  Administered 2019-08-30: 22:00:00 5 ug/min via INTRAVENOUS
  Filled 2019-08-30: qty 250

## 2019-08-30 MED ORDER — SODIUM CHLORIDE 0.9% FLUSH
3.0000 mL | Freq: Two times a day (BID) | INTRAVENOUS | Status: DC
  Administered 2019-08-31 – 2019-09-01 (×2): 3 mL via INTRAVENOUS
  Filled 2019-08-30: qty 3

## 2019-08-30 MED ORDER — METOPROLOL TARTRATE 25 MG PO TABS
25.0000 mg | ORAL_TABLET | Freq: Two times a day (BID) | ORAL | Status: DC
  Administered 2019-08-30 – 2019-08-31 (×2): 25 mg via ORAL
  Filled 2019-08-30 (×3): qty 1

## 2019-08-30 MED ORDER — ASPIRIN EC 81 MG PO TBEC
81.0000 mg | DELAYED_RELEASE_TABLET | Freq: Every day | ORAL | Status: DC
  Administered 2019-09-01: 81 mg via ORAL
  Filled 2019-08-30: qty 1

## 2019-08-30 MED ORDER — HEPARIN (PORCINE) 25000 UT/250ML-% IV SOLN
1450.0000 [IU]/h | INTRAVENOUS | Status: DC
  Administered 2019-08-30 – 2019-08-31 (×2): 1200 [IU]/h via INTRAVENOUS
  Filled 2019-08-30 (×2): qty 250

## 2019-08-30 MED ORDER — SODIUM CHLORIDE 0.9% FLUSH
3.0000 mL | Freq: Once | INTRAVENOUS | Status: DC
  Filled 2019-08-30: qty 3

## 2019-08-30 MED ORDER — ACETAMINOPHEN 325 MG PO TABS
650.0000 mg | ORAL_TABLET | ORAL | Status: DC | PRN
  Administered 2019-08-31 (×2): 650 mg via ORAL
  Filled 2019-08-30 (×2): qty 2

## 2019-08-30 MED ORDER — ATORVASTATIN CALCIUM 80 MG PO TABS
80.0000 mg | ORAL_TABLET | Freq: Every day | ORAL | Status: DC
  Administered 2019-08-31: 80 mg via ORAL
  Filled 2019-08-30 (×2): qty 1

## 2019-08-30 NOTE — ED Notes (Signed)
Heparin dose verified with Casandra Doffing RN.

## 2019-08-30 NOTE — ED Provider Notes (Signed)
[facility] EMERGENCY DEPARTMENT Provider Note   CSN: [id number] Arrival date & time: 08/30/19  1721     History   Chief Complaint Chief Complaint  Patient presents with  . Chest Pain    HPI Eric Cohen is a 63 y.o. male with past medical history of hypertension, type 2 diabetes, DVT, presenting to the emergency department with 2 weeks of worsening intermittent substernal chest pain.  Patient states pain occurs at rest and feels like somebody just punched him in the chest.  Pain radiates to his back.  He denies associated nausea or diaphoresis.  He has been seen by his PCP at the [place] who referred him to cardiology and he saw on 15 October.  They scheduled him for outpatient echocardiogram and exercise stress test.  He has had the echocardiogram, however stress test is not for another 10 days.  He was prescribed sublingual nitroglycerin to use as needed which provided some improvement in his symptoms along with aspirin, however he ran out the nitroglycerin yesterday.  His symptoms worsened today around 4 PM when he was sitting on the couch and had a worsening episode of chest pain the last about 30 minutes.  Symptoms are described as substernal, radiating to his back.       The history is provided by the patient and medical records.    Past Medical History:  Diagnosis Date  . Asthma   . Diabetes mellitus without complication (HCC)   . Diverticulitis   . DVT (deep venous thrombosis) (HCC)   . Hypertension     Patient Active Problem List   Diagnosis Date Noted  . Unstable angina (HCC) 08/30/2019    Past Surgical History:  Procedure Laterality Date  . APPENDECTOMY    . CHOLECYSTECTOMY    . COLOSTOMY REVERSAL    . KNEE SURGERY          Home Medications    Prior to Admission medications   Medication Sig Start Date End Date Taking? Authorizing Provider  albuterol (PROVENTIL HFA;VENTOLIN HFA) 108 (90 BASE) MCG/ACT inhaler Inhale 2 puffs into the lungs every 6  (six) hours as needed for wheezing.    [provider]  ALBUTEROL IN Inhale into the lungs.    [provider]  aspirin EC 81 MG tablet Take 1 tablet (81 mg total) by mouth daily. 08/09/19   Toniann Fail, NP  Budesonide-Formoterol Fumarate (SYMBICORT IN) Inhale into the lungs.    [provider]  insulin glargine (LANTUS) 100 UNIT/ML injection Inject 68 Units into the skin at bedtime.    [provider]  nitroGLYCERIN (NITROSTAT) 0.4 MG SL tablet Place 1 tablet (0.4 mg total) under the tongue every 5 (five) minutes as needed for chest pain. 08/09/19 11/07/19  Toniann Fail, NP    Family History Family History  Problem Relation Age of Onset  . Heart failure Mother   . Diabetes Father   . Hyperlipidemia Father     Social History Social History   Tobacco Use  . Smoking status: Never Smoker  . Smokeless tobacco: Never Used  Substance Use Topics  . Alcohol use: No  . Drug use: No     Allergies   Shellfish allergy   Review of Systems Review of Systems  Cardiovascular: Positive for chest pain.  All other systems reviewed and are negative.    Physical Exam Updated Vital Signs BP (!) 148/89   Pulse 72   Temp 98.6 F (37 C) (Oral)  Resp 19   Ht 5\' 11"  (1.803 m)   Wt 108.4 kg   SpO2 98%   BMI 33.33 kg/m   Physical Exam Vitals signs and nursing note reviewed.  Constitutional:      General: He is not in acute distress.    Appearance: He is well-developed. He is obese.  HENT:     Head: Normocephalic and atraumatic.  Eyes:     Conjunctiva/sclera: Conjunctivae normal.  Cardiovascular:     Rate and Rhythm: Normal rate and regular rhythm.  Pulmonary:     Effort: Pulmonary effort is normal. No respiratory distress.     Breath sounds: Normal breath sounds.  Abdominal:     Palpations: Abdomen is soft.  Musculoskeletal:     Right lower leg: No edema.     Left lower leg: No edema.  Skin:    General: Skin is warm.   Neurological:     Mental Status: He is alert.  Psychiatric:        Behavior: Behavior normal.      ED Treatments / Results  Labs (all labs ordered are listed, but only abnormal results are displayed) Labs Reviewed  BASIC METABOLIC PANEL - Abnormal; Notable for the following components:      Result Value   Glucose, Bld 254 (*)    All other components within normal limits  CBC - Abnormal; Notable for the following components:   WBC 11.8 (*)    RDW 17.2 (*)    All other components within normal limits  SARS CORONAVIRUS 2 (TAT 6-24 HRS)  HIV ANTIBODY (ROUTINE TESTING W REFLEX)  LIPID PANEL  HEPARIN LEVEL (UNFRACTIONATED)  TROPONIN I (HIGH SENSITIVITY)  TROPONIN I (HIGH SENSITIVITY)  TROPONIN I (HIGH SENSITIVITY)    EKG None  Radiology Dg Chest 2 View  Result Date: 08/30/2019 CLINICAL DATA:  Chest pain EXAM: CHEST - 2 VIEW COMPARISON:  03/23/2016 FINDINGS: The heart size and mediastinal contours are within normal limits. Both lungs are clear. The visualized skeletal structures are unremarkable. IMPRESSION: No active cardiopulmonary disease. Electronically Signed   By: Donavan Foil M.D.   On: 08/30/2019 18:01   Ct Angio Chest Pe W/cm &/or Wo Cm  Result Date: 08/30/2019 CLINICAL DATA:  63 year old male with acute LEFT chest pain. EXAM: CT ANGIOGRAPHY CHEST WITH CONTRAST TECHNIQUE: Multidetector CT imaging of the chest was performed using the standard protocol during bolus administration of intravenous contrast. Multiplanar CT image reconstructions and MIPs were obtained to evaluate the vascular anatomy. CONTRAST:  111mL OMNIPAQUE IOHEXOL 350 MG/ML SOLN COMPARISON:  None. FINDINGS: Cardiovascular: This is a technically adequate study. Motion artifact, especially in the LOWER lungs slightly decreases sensitivity. No pulmonary emboli are identified. Cardiomegaly noted. Coronary artery atherosclerotic calcifications are present. No thoracic aortic aneurysm or pericardial effusion.  Mediastinum/Nodes: No enlarged mediastinal, hilar, or axillary lymph nodes. Thyroid gland, trachea, and esophagus demonstrate no significant findings. Lungs/Pleura: Lungs are clear. No pleural effusion or pneumothorax. Upper Abdomen: No acute abnormality. Hepatic steatosis again noted. Musculoskeletal: No acute or suspicious bony abnormalities noted. Review of the MIP images confirms the above findings. IMPRESSION: 1. No evidence of acute abnormality. No evidence of pulmonary emboli. 2. Cardiomegaly and coronary artery disease. 3. Hepatic steatosis. Electronically Signed   By: Margarette Canada M.D.   On: 08/30/2019 20:29    Procedures .Critical Care Performed by: Robinson, [place] N, PA-C Authorized by: Robinson, [place] N, PA-C   Critical care provider statement:    Critical care time (minutes):  30  Critical care was necessary to treat or prevent imminent or life-threatening deterioration of the following conditions:  Cardiac failure   Critical care was time spent personally by me on the following activities:  Discussions with consultants, evaluation of patient's response to treatment, examination of patient, ordering and performing treatments and interventions, ordering and review of laboratory studies, ordering and review of radiographic studies, pulse oximetry, re-evaluation of patient's condition, obtaining history from patient or surrogate and review of old charts   I assumed direction of critical care for this patient from another provider in my specialty: no     (including critical care time)  Medications Ordered in ED Medications  sodium chloride flush (NS) 0.9 % injection 3 mL (3 mLs Intravenous Not Given 08/30/19 2101)  nitroGLYCERIN (NITROSTAT) SL tablet 0.4 mg (0.4 mg Sublingual Given 08/30/19 1927)  aspirin chewable tablet 324 mg (has no administration in time range)  aspirin EC tablet 81 mg (has no administration in time range)  acetaminophen (TYLENOL) tablet 650 mg (has no  administration in time range)  ondansetron (ZOFRAN) injection 4 mg (has no administration in time range)  nitroGLYCERIN 50 mg in dextrose 5 % 250 mL (0.2 mg/mL) infusion (has no administration in time range)  metoprolol tartrate (LOPRESSOR) tablet 25 mg (has no administration in time range)  atorvastatin (LIPITOR) tablet 80 mg (has no administration in time range)  sodium chloride flush (NS) 0.9 % injection 3 mL (has no administration in time range)  heparin bolus via infusion 4,000 Units (has no administration in time range)  heparin ADULT infusion 100 units/mL (25000 units/23450mL sodium chloride 0.45%) (has no administration in time range)  iohexol (OMNIPAQUE) 350 MG/ML injection 100 mL (100 mLs Intravenous Contrast Given 08/30/19 1947)     Initial Impression / Assessment and Plan / ED Course  I have reviewed the triage vital signs and the nursing notes.  Pertinent labs & imaging results that were available during my care of the patient were reviewed by me and considered in my medical decision making (see chart for details).  Clinical Course as of Aug 29 2145  Thu Aug 30, 2019  [id number] Patient had recurrent episode of chest pain while in the ED.  He was administered 2 sublingual nitroglycerin and pain went from a 9 to a 2.  Presentation remains concerning for unstable angina.  Suspect patient will likely require admission after CT scan.   [JR]  2106 Dr. Jacinto HalimGanji consulted, agreeing with admission for unstable angina. Recommends Heparin and ntg drip. Pt agreeable to plan   [JR]    Clinical Course User Index [JR] Robinson, [place] N, PA-C     Patient presenting with symptoms concerning for worsening unstable angina.  Reporting substernal chest pain at rest rating to his back improved by nitroglycerin of the last 2 weeks, worsening today.  Patient's risk factors include obesity, hypertension, diabetes.  On exam he is in no distress though does have an episode of chest pain while in the ED pending  work-up which is significantly improved with nitroglycerin.  No EKG changes today.  Cardiac enzymes are negative.  Discussed with Dr. Jacinto HalimGanji, who agrees with admission.  Recommends heparin and nitroglycerin drip.  Patient will likely receive catheterization in the morning.  Appreciate consult.  Patient agreeable to plan for admission.  Discussed results, findings, treatment and follow up. Patient advised of return precautions. Patient verbalized understanding and agreed with plan.  Final Clinical Impressions(s) / ED Diagnoses   Final diagnoses:  Unstable angina (HCC)  ED Discharge Orders    None       Robinson, [place] N, PA-C 08/30/19 2146    Sabas Sous, MD 08/30/19 [phone]

## 2019-08-30 NOTE — ED Notes (Signed)
Patient transported to CT 

## 2019-08-30 NOTE — ED Triage Notes (Signed)
Pt reports L sided chest pain that radiates into his back- pt has been taking nitro every day x 2 weeks with no relief. Out of nitro today- did take aspirin this AM. Pt has stress test next week.

## 2019-08-30 NOTE — Progress Notes (Signed)
[place] for Heparin  Indication: chest pain/ACS  Allergies  Allergen Reactions  . Shellfish Allergy Anaphylaxis    "close my throat"    Patient Measurements: Height: 5\' 11"  (180.3 cm) Weight: 239 lb (108.4 kg) IBW/kg (Calculated) : 75.3 Heparin Dosing Weight: 98.4 kg   Vital Signs: Temp: 98.6 F (37 C) (11/05 1728) Temp Source: Oral (11/05 1728) BP: 148/89 (11/05 2000) Pulse Rate: 72 (11/05 2000)  Labs: Recent Labs    08/30/19 1736 08/30/19 1930  HGB 13.6  --   HCT 39.5  --   PLT 246  --   CREATININE 1.00  --   TROPONINIHS 3 4    Estimated Creatinine Clearance: 97.1 mL/min (by C-G formula based on SCr of 1 mg/dL).   Medical History: Past Medical History:  Diagnosis Date  . Asthma   . Diabetes mellitus without complication ([place])   . Diverticulitis   . DVT (deep venous thrombosis) ([place])   . Hypertension     Medications:  Scheduled:  . aspirin  324 mg Oral NOW  . [START ON 08/31/2019] aspirin EC  81 mg Oral Daily  . [START ON 08/31/2019] atorvastatin  80 mg Oral q1800  . heparin  4,000 Units Intravenous Once  . metoprolol tartrate  25 mg Oral BID  . sodium chloride flush  3 mL Intravenous Once  . sodium chloride flush  3 mL Intravenous Q12H    Assessment: Patient is a 89 yom that presented to the ED with chest pain relieved by NTG. Patient is being admitted for unstable angina and will receive a cardiac cath due to concern for coronary atherosclerosis.   Goal of Therapy:  Heparin level 0.3-0.7 units/ml Monitor platelets by anticoagulation protocol: Yes   Plan:  - Patient states has not taken xarelto for several months  - Will bolus with Heparin 4000 units IV x 1 dose  - Heparin Drip @ 1200 units/hr  - Heparin level in 6 hours from start  - monitor for s/s of bleeding and [place] PharmD. BCPS  08/30/2019,9:24 PM

## 2019-08-30 NOTE — Progress Notes (Signed)
Primary Physician:  Lin Landsman, MD   Patient ID: Eric Cohen, male    DOB: 05/21/1956, 63 y.o.   MRN: [id number]  Subjective:    Chief Complaint  Patient presents with  . Chest Pain    HPI: Eric Cohen  is a 63 y.o. male  with history of right DVT in April 2020 treated with Xarelto, type 2 diabetes,  Seen by me recently in the office with chest pain suggestive of angina pectoris presented to the ED with chest pain, central and pressure like sensation and as he did not have NTG and chest pain was persistent presented to the ED. He was still in active chest pain and received S/L NTG with improvement in chest pain. Underwent CTA chest which is negative for PE, but showed coronary artery disease. Chest pain was associated with marked dyspnea. He does have chronic dyspnea and h/o asthma. This morning states chest tightness comes on and off.   Past Medical History:  Diagnosis Date  . Asthma   . Diabetes mellitus without complication ([place])   . Diverticulitis   . DVT (deep venous thrombosis) ([place])   . Hypertension     Past Surgical History:  Procedure Laterality Date  . APPENDECTOMY    . CHOLECYSTECTOMY    . COLOSTOMY REVERSAL    . KNEE SURGERY      Social History   Socioeconomic History  . Marital status: Married    Spouse name: Not on file  . Number of children: 1  . Years of education: Not on file  . Highest education level: Not on file  Occupational History  . Not on file  Social Needs  . Financial resource strain: Not on file  . Food insecurity    Worry: Not on file    Inability: Not on file  . Transportation needs    Medical: Not on file    Non-medical: Not on file  Tobacco Use  . Smoking status: Never Smoker  . Smokeless tobacco: Never Used  Substance and Sexual Activity  . Alcohol use: No  . Drug use: No  . Sexual activity: Not on file  Lifestyle  . Physical activity    Days per week: Not on file    Minutes per session: Not on file  . Stress: Not  on file  Relationships  . Social Herbalist on phone: Not on file    Gets together: Not on file    Attends religious service: Not on file    Active member of club or organization: Not on file    Attends meetings of clubs or organizations: Not on file    Relationship status: Not on file  . Intimate partner violence    Fear of current or ex partner: Not on file    Emotionally abused: Not on file    Physically abused: Not on file    Forced sexual activity: Not on file  Other Topics Concern  . Not on file  Social History Narrative  . Not on file    Review of Systems  Constitution: Negative for decreased appetite, malaise/fatigue, weight gain and weight loss.  Eyes: Negative for visual disturbance.  Cardiovascular: Positive for chest pain and dyspnea on exertion. Negative for claudication, leg swelling, orthopnea, palpitations and syncope.  Respiratory: Negative for hemoptysis and wheezing.   Endocrine: Negative for cold intolerance and heat intolerance.  Hematologic/Lymphatic: Does not bruise/bleed easily.  Skin: Negative for nail changes.  Musculoskeletal: Negative for  muscle weakness and myalgias.  Gastrointestinal: Negative for abdominal pain, change in bowel habit, nausea and vomiting.  Neurological: Negative for difficulty with concentration, dizziness, focal weakness and headaches.  Psychiatric/Behavioral: Negative for altered mental status and suicidal ideas.  All other systems reviewed and are negative.     Objective:  Blood pressure 137/89, pulse 69, temperature 97.9 F (36.6 C), temperature source Oral, resp. rate 14, height 5\' 11"  (1.803 m), weight 109.2 kg, SpO2 100 %. Body mass index is 33.57 kg/m.    Physical Exam  Constitutional: He is oriented to person, place, and time. Vital signs are normal.  He is well-built and mildly obese in no acute distress  HENT:  Head: Normocephalic and atraumatic.  Neck: Normal range of motion.  Cardiovascular: Normal  rate, regular rhythm, normal heart sounds and intact distal pulses.  Pulmonary/Chest: Effort normal and breath sounds normal. No accessory muscle usage. No respiratory distress.  Abdominal: Soft. Bowel sounds are normal.  Musculoskeletal: Normal range of motion.  Neurological: He is alert and oriented to person, place, and time.  Skin: Skin is warm and dry.  Vitals reviewed.  Radiology: Dg Chest 2 View  Result Date: 08/30/2019 CLINICAL DATA:  Chest pain EXAM: CHEST - 2 VIEW COMPARISON:  03/23/2016 FINDINGS: The heart size and mediastinal contours are within normal limits. Both lungs are clear. The visualized skeletal structures are unremarkable. IMPRESSION: No active cardiopulmonary disease. Electronically Signed   By: 03/25/2016 M.D.   On: 08/30/2019 18:01   Ct Angio Chest Pe W/cm &/or Wo Cm  Result Date: 08/30/2019 CLINICAL DATA:  63 year old male with acute LEFT chest pain. EXAM: CT ANGIOGRAPHY CHEST WITH CONTRAST TECHNIQUE: Multidetector CT imaging of the chest was performed using the standard protocol during bolus administration of intravenous contrast. Multiplanar CT image reconstructions and MIPs were obtained to evaluate the vascular anatomy. CONTRAST:  77 OMNIPAQUE IOHEXOL 350 MG/ML SOLN COMPARISON:  None. FINDINGS: Cardiovascular: This is a technically adequate study. Motion artifact, especially in the LOWER lungs slightly decreases sensitivity. No pulmonary emboli are identified. Cardiomegaly noted. Coronary artery atherosclerotic calcifications are present. No thoracic aortic aneurysm or pericardial effusion. Mediastinum/Nodes: No enlarged mediastinal, hilar, or axillary lymph nodes. Thyroid gland, trachea, and esophagus demonstrate no significant findings. Lungs/Pleura: Lungs are clear. No pleural effusion or pneumothorax. Upper Abdomen: No acute abnormality. Hepatic steatosis again noted. Musculoskeletal: No acute or suspicious bony abnormalities noted. Review of the MIP images  confirms the above findings. IMPRESSION: 1. No evidence of acute abnormality. No evidence of pulmonary emboli. 2. Cardiomegaly and coronary artery disease. 3. Hepatic steatosis. Electronically Signed   By: M.D.   On: 08/30/2019 20:29    Laboratory examination:   11/09/2018: Cholesterol 144, triglycerides 109, HDL 46, LDL 79.  CMP Latest Ref Rng & Units 08/30/2019 02/28/2019 02/06/2019  Glucose 70 - 99 mg/dL 02/08/2019) 891(Q) 945(W)  BUN 8 - 23 mg/dL 14 14 13   Creatinine 0.61 - 1.24 mg/dL 388(E ) 2.80  Sodium 135 - 145 mmol/L 135 136 135  Potassium 3.5 - 5.1 mmol/L 4.1 3.8 4.0  Chloride 98 - 111 mmol/L 101 103 102  CO2 22 - 32 mmol/L 24 26 26   Calcium 8.9 - 10.3 mg/dL 9.0 9.0 9.0  Total Protein 6.5 - 8.1 g/dL - 7.5 -  Total Bilirubin 0.3 - 1.2 mg/dL - 1.2 -  Alkaline Phos 38 - 126 U/L - 82 -  AST 15 - 41 U/L - 31 -  ALT 0 - 44 U/L -  34 -   CBC Latest Ref Rng & Units 08/31/2019 08/30/2019 02/28/2019  WBC 4.0 - 10.5 K/uL 11.0(H) 11.8(H) 10.9(H)  Hemoglobin 13.0 - 17.0 g/dL 13.4 13.6 12.1(L)  Hematocrit 39.0 - 52.0 % 38.7(L) 39.5 36.3(L)  Platelets 150 - 400 K/uL 174 246 264   Lipid Panel  No results found for: CHOL, TRIG, HDL, CHOLHDL, VLDL, LDLCALC, LDLDIRECT HEMOGLOBIN A1C No results found for: HGBA1C, MPG TSH No results for input(s): TSH in the last 8760 hours.  PRN Meds:. Medications Discontinued During This Encounter  Medication Reason  . aspirin suppository 300 mg    No outpatient medications have been marked as taking for the 08/30/19 encounter (Hospital Encounter).    Cardiac Studies:   Echocardiogram 08/21/2019: Left ventricle cavity is normal in size. Mild concentric hypertrophy of the left ventricle. Normal LV systolic function with visual EF 50-55%. Normal global wall motion. Doppler evidence of grade I (impaired) diastolic dysfunction, normal LAP. Calculated EF 50%. Trileaflet aortic valve with mild aortic valve leaflet thickening. Mild (Grade I) aortic  regurgitation. The IVC is not well visualized.  Assessment:  1. Coronary atherosclerosis by CT chest done 08/30/19  2. Unstable angina pectoris 3. Controlled DM without complications and without hyperglycemia  EKG 08/30/19: NSR.  No ischemia.  EKG 08/09/2019: Normal sinus rhythm at 80 bpm, normal axis, possible old anterior infarct. T wave flattening in inferolateral leads, cannot exclude ischemia.   Recommendations:   Patient presenting with symptoms suggestive of unstable angina pectoris, although EKG is normal, for set of troponins negative, given his CTA findings suggestive of coronary atherosclerosis, Cardiovascular risk factors including hypertension and diabetes mellitus, would recommend admission to the hospital for further evaluation.  He relates coronary angiography.  We'll start him on IV heparin and IV nitroglycerin.  I'll make further recommendation after cardiac catheterization.  I spoke to his wife over the phone, patient continues to have on and off chest pain in spite of being on heparin and IV NTG.  BS elevated as well.   Discussed risks, benefits and alternatives of angiogram including but not limited to <1% risk of death, stroke, MI, need for urgent surgical revascularization, renal failure, but not limited to thest. patient is willing to proceed.   Lynell Greenhouse, MD, FACC 08/31/2019, 7:55 AM [facility] [place] Pager: [phone] Office: [phone] If no answer Cell [phone]  

## 2019-08-30 NOTE — ED Notes (Signed)
Patient transported to X-ray 

## 2019-08-30 NOTE — H&P (View-Only) (Signed)
Primary Physician:  Lin Landsman, MD   Patient ID: Eric Cohen, male    DOB: 05/21/1956, 63 y.o.   MRN: [id number]  Subjective:    Chief Complaint  Patient presents with  . Chest Pain    HPI: Eric Cohen  is a 63 y.o. male  with history of right DVT in April 2020 treated with Xarelto, type 2 diabetes,  Seen by me recently in the office with chest pain suggestive of angina pectoris presented to the ED with chest pain, central and pressure like sensation and as he did not have NTG and chest pain was persistent presented to the ED. He was still in active chest pain and received S/L NTG with improvement in chest pain. Underwent CTA chest which is negative for PE, but showed coronary artery disease. Chest pain was associated with marked dyspnea. He does have chronic dyspnea and h/o asthma. This morning states chest tightness comes on and off.   Past Medical History:  Diagnosis Date  . Asthma   . Diabetes mellitus without complication ([place])   . Diverticulitis   . DVT (deep venous thrombosis) ([place])   . Hypertension     Past Surgical History:  Procedure Laterality Date  . APPENDECTOMY    . CHOLECYSTECTOMY    . COLOSTOMY REVERSAL    . KNEE SURGERY      Social History   Socioeconomic History  . Marital status: Married    Spouse name: Not on file  . Number of children: 1  . Years of education: Not on file  . Highest education level: Not on file  Occupational History  . Not on file  Social Needs  . Financial resource strain: Not on file  . Food insecurity    Worry: Not on file    Inability: Not on file  . Transportation needs    Medical: Not on file    Non-medical: Not on file  Tobacco Use  . Smoking status: Never Smoker  . Smokeless tobacco: Never Used  Substance and Sexual Activity  . Alcohol use: No  . Drug use: No  . Sexual activity: Not on file  Lifestyle  . Physical activity    Days per week: Not on file    Minutes per session: Not on file  . Stress: Not  on file  Relationships  . Social Herbalist on phone: Not on file    Gets together: Not on file    Attends religious service: Not on file    Active member of club or organization: Not on file    Attends meetings of clubs or organizations: Not on file    Relationship status: Not on file  . Intimate partner violence    Fear of current or ex partner: Not on file    Emotionally abused: Not on file    Physically abused: Not on file    Forced sexual activity: Not on file  Other Topics Concern  . Not on file  Social History Narrative  . Not on file    Review of Systems  Constitution: Negative for decreased appetite, malaise/fatigue, weight gain and weight loss.  Eyes: Negative for visual disturbance.  Cardiovascular: Positive for chest pain and dyspnea on exertion. Negative for claudication, leg swelling, orthopnea, palpitations and syncope.  Respiratory: Negative for hemoptysis and wheezing.   Endocrine: Negative for cold intolerance and heat intolerance.  Hematologic/Lymphatic: Does not bruise/bleed easily.  Skin: Negative for nail changes.  Musculoskeletal: Negative for  muscle weakness and myalgias.  Gastrointestinal: Negative for abdominal pain, change in bowel habit, nausea and vomiting.  Neurological: Negative for difficulty with concentration, dizziness, focal weakness and headaches.  Psychiatric/Behavioral: Negative for altered mental status and suicidal ideas.  All other systems reviewed and are negative.     Objective:  Blood pressure 137/89, pulse 69, temperature 97.9 F (36.6 C), temperature source Oral, resp. rate 14, height 5\' 11"  (1.803 m), weight 109.2 kg, SpO2 100 %. Body mass index is 33.57 kg/m.    Physical Exam  Constitutional: He is oriented to person, place, and time. Vital signs are normal.  He is well-built and mildly obese in no acute distress  HENT:  Head: Normocephalic and atraumatic.  Neck: Normal range of motion.  Cardiovascular: Normal  rate, regular rhythm, normal heart sounds and intact distal pulses.  Pulmonary/Chest: Effort normal and breath sounds normal. No accessory muscle usage. No respiratory distress.  Abdominal: Soft. Bowel sounds are normal.  Musculoskeletal: Normal range of motion.  Neurological: He is alert and oriented to person, place, and time.  Skin: Skin is warm and dry.  Vitals reviewed.  Radiology: Dg Chest 2 View  Result Date: 08/30/2019 CLINICAL DATA:  Chest pain EXAM: CHEST - 2 VIEW COMPARISON:  03/23/2016 FINDINGS: The heart size and mediastinal contours are within normal limits. Both lungs are clear. The visualized skeletal structures are unremarkable. IMPRESSION: No active cardiopulmonary disease. Electronically Signed   By: 03/25/2016 M.D.   On: 08/30/2019 18:01   Ct Angio Chest Pe W/cm &/or Wo Cm  Result Date: 08/30/2019 CLINICAL DATA:  63 year old male with acute LEFT chest pain. EXAM: CT ANGIOGRAPHY CHEST WITH CONTRAST TECHNIQUE: Multidetector CT imaging of the chest was performed using the standard protocol during bolus administration of intravenous contrast. Multiplanar CT image reconstructions and MIPs were obtained to evaluate the vascular anatomy. CONTRAST:  77 OMNIPAQUE IOHEXOL 350 MG/ML SOLN COMPARISON:  None. FINDINGS: Cardiovascular: This is a technically adequate study. Motion artifact, especially in the LOWER lungs slightly decreases sensitivity. No pulmonary emboli are identified. Cardiomegaly noted. Coronary artery atherosclerotic calcifications are present. No thoracic aortic aneurysm or pericardial effusion. Mediastinum/Nodes: No enlarged mediastinal, hilar, or axillary lymph nodes. Thyroid gland, trachea, and esophagus demonstrate no significant findings. Lungs/Pleura: Lungs are clear. No pleural effusion or pneumothorax. Upper Abdomen: No acute abnormality. Hepatic steatosis again noted. Musculoskeletal: No acute or suspicious bony abnormalities noted. Review of the MIP images  confirms the above findings. IMPRESSION: 1. No evidence of acute abnormality. No evidence of pulmonary emboli. 2. Cardiomegaly and coronary artery disease. 3. Hepatic steatosis. Electronically Signed   By: M.D.   On: 08/30/2019 20:29    Laboratory examination:   11/09/2018: Cholesterol 144, triglycerides 109, HDL 46, LDL 79.  CMP Latest Ref Rng & Units 08/30/2019 02/28/2019 02/06/2019  Glucose 70 - 99 mg/dL 02/08/2019) 891(Q) 945(W)  BUN 8 - 23 mg/dL 14 14 13   Creatinine 0.61 - 1.24 mg/dL 388(E ) 2.80  Sodium 135 - 145 mmol/L 135 136 135  Potassium 3.5 - 5.1 mmol/L 4.1 3.8 4.0  Chloride 98 - 111 mmol/L 101 103 102  CO2 22 - 32 mmol/L 24 26 26   Calcium 8.9 - 10.3 mg/dL 9.0 9.0 9.0  Total Protein 6.5 - 8.1 g/dL - 7.5 -  Total Bilirubin 0.3 - 1.2 mg/dL - 1.2 -  Alkaline Phos 38 - 126 U/L - 82 -  AST 15 - 41 U/L - 31 -  ALT 0 - 44 U/L -  34 -   CBC Latest Ref Rng & Units 08/31/2019 08/30/2019 02/28/2019  WBC 4.0 - 10.5 K/uL 11.0(H) 11.8(H) 10.9(H)  Hemoglobin 13.0 - 17.0 g/dL 16.113.4 09.613.6 12.1(L)  Hematocrit 39.0 - 52.0 % 38.7(L) 39.5 36.3(L)  Platelets 150 - 400 K/uL 174 246 264   Lipid Panel  No results found for: CHOL, TRIG, HDL, CHOLHDL, VLDL, LDLCALC, LDLDIRECT HEMOGLOBIN A1C No results found for: HGBA1C, MPG TSH No results for input(s): TSH in the last 8760 hours.  PRN Meds:. Medications Discontinued During This Encounter  Medication Reason  . aspirin suppository 300 mg    No outpatient medications have been marked as taking for the 08/30/19 encounter [facility](Hospital Encounter).    Cardiac Studies:   Echocardiogram 08/21/2019: Left ventricle cavity is normal in size. Mild concentric hypertrophy of the left ventricle. Normal LV systolic function with visual EF 50-55%. Normal global wall motion. Doppler evidence of grade I (impaired) diastolic dysfunction, normal LAP. Calculated EF 50%. Trileaflet aortic valve with mild aortic valve leaflet thickening. Mild (Grade I) aortic  regurgitation. The IVC is not well visualized.  Assessment:  1. Coronary atherosclerosis by CT chest done 08/30/19  2. Unstable angina pectoris 3. Controlled DM without complications and without hyperglycemia  EKG 08/30/19: NSR.  No ischemia.  EKG 08/09/2019: Normal sinus rhythm at 80 bpm, normal axis, possible old anterior infarct. T wave flattening in inferolateral leads, cannot exclude ischemia.   Recommendations:   Patient presenting with symptoms suggestive of unstable angina pectoris, although EKG is normal, for set of troponins negative, given his CTA findings suggestive of coronary atherosclerosis, Cardiovascular risk factors including hypertension and diabetes mellitus, would recommend admission to the hospital for further evaluation.  He relates coronary angiography.  We'll start him on IV heparin and IV nitroglycerin.  I'll make further recommendation after cardiac catheterization.  I spoke to his wife over the phone, patient continues to have on and off chest pain in spite of being on heparin and IV NTG.  BS elevated as well.   Discussed risks, benefits and alternatives of angiogram including but not limited to <1% risk of death, stroke, MI, need for urgent surgical revascularization, renal failure, but not limited to thest. patient is willing to proceed.   Yates DecampJay Kashmir Leedy, MD, [facility] Memorial HospitalFACC 08/31/2019, 7:55 AM [facility] [place] Pager: [phone] Office: [phone] If no answer Cell [phone]

## 2019-08-30 NOTE — ED Notes (Signed)
Report to carelink.  

## 2019-08-31 ENCOUNTER — Encounter (HOSPITAL_COMMUNITY)
Admission: EM | Disposition: A | Discharge status: Home / Self Care | Payer: Self-pay | Source: Home / Self Care | Attending: Emergency Medicine

## 2019-08-31 ENCOUNTER — Encounter (HOSPITAL_COMMUNITY): Payer: Self-pay | Admitting: General Practice

## 2019-08-31 ENCOUNTER — Other Ambulatory Visit: Payer: Self-pay

## 2019-08-31 DIAGNOSIS — I25110 Atherosclerotic heart disease of native coronary artery with unstable angina pectoris: Secondary | ICD-10-CM | POA: Diagnosis not present

## 2019-08-31 DIAGNOSIS — R0600 Dyspnea, unspecified: Secondary | ICD-10-CM | POA: Diagnosis not present

## 2019-08-31 HISTORY — PX: LEFT HEART CATH AND CORONARY ANGIOGRAPHY: CATH118249

## 2019-08-31 LAB — CBC
HCT: 38.7 % — ABNORMAL LOW (ref 39.0–52.0)
Hemoglobin: 13.4 g/dL (ref 13.0–17.0)
MCH: 30.5 pg (ref 26.0–34.0)
MCHC: 34.6 g/dL (ref 30.0–36.0)
MCV: 88 fL (ref 80.0–100.0)
Platelets: 174 10*3/uL (ref 150–400)
RBC: 4.4 MIL/uL (ref 4.22–5.81)
RDW: 17 % — ABNORMAL HIGH (ref 11.5–15.5)
WBC: 11 10*3/uL — ABNORMAL HIGH (ref 4.0–10.5)
nRBC: 0 % (ref 0.0–0.2)

## 2019-08-31 LAB — GLUCOSE, CAPILLARY
Glucose-Capillary: 176 mg/dL — ABNORMAL HIGH (ref 70–99)
Glucose-Capillary: 174 mg/dL — ABNORMAL HIGH (ref 70–99)
Glucose-Capillary: 167 mg/dL — ABNORMAL HIGH (ref 70–99)
Glucose-Capillary: 195 mg/dL — ABNORMAL HIGH (ref 70–99)

## 2019-08-31 LAB — LIPID PANEL
Cholesterol: 181 mg/dL (ref 0–200)
HDL: 36 mg/dL — ABNORMAL LOW (ref >40)
LDL Cholesterol: 114 mg/dL — ABNORMAL HIGH (ref 0–99)
Total CHOL/HDL Ratio: 5 RATIO
Triglycerides: 157 mg/dL — ABNORMAL HIGH (ref <150)
VLDL: 31 mg/dL (ref 0–40)

## 2019-08-31 LAB — HEMOGLOBIN A1C
Hgb A1c MFr Bld: 8.3 % — ABNORMAL HIGH (ref 4.8–5.6)
Mean Plasma Glucose: 191.51 mg/dL

## 2019-08-31 LAB — SARS CORONAVIRUS 2 (TAT 6-24 HRS): SARS Coronavirus 2: NEGATIVE

## 2019-08-31 LAB — TSH: TSH: 0.691 u[IU]/mL (ref 0.350–4.500)

## 2019-08-31 LAB — HEPARIN LEVEL (UNFRACTIONATED)
Heparin Unfractionated: 0.3 IU/mL (ref 0.30–0.70)
Heparin Unfractionated: 0.18 IU/mL — ABNORMAL LOW (ref 0.30–0.70)

## 2019-08-31 LAB — HIV ANTIBODY (ROUTINE TESTING W REFLEX): HIV Screen 4th Generation wRfx: NONREACTIVE

## 2019-08-31 SURGERY — LEFT HEART CATH AND CORONARY ANGIOGRAPHY
Anesthesia: LOCAL

## 2019-08-31 MED ORDER — NITROGLYCERIN 1 MG/10 ML FOR IR/CATH LAB
INTRA_ARTERIAL | Status: AC
  Filled 2019-08-31: qty 10

## 2019-08-31 MED ORDER — INSULIN GLARGINE 100 UNIT/ML ~~LOC~~ SOLN
20.0000 [IU] | Freq: Every day | SUBCUTANEOUS | Status: DC
  Administered 2019-08-31: 20 [IU] via SUBCUTANEOUS
  Filled 2019-08-31 (×2): qty 0.2

## 2019-08-31 MED ORDER — HEPARIN SODIUM (PORCINE) 1000 UNIT/ML IJ SOLN
INTRAMUSCULAR | Status: DC | PRN
  Administered 2019-08-31: 5000 [IU] via INTRAVENOUS

## 2019-08-31 MED ORDER — HYDROMORPHONE HCL 1 MG/ML IJ SOLN
INTRAMUSCULAR | Status: AC
  Filled 2019-08-31: qty 0.5

## 2019-08-31 MED ORDER — ASPIRIN 81 MG PO CHEW
81.0000 mg | CHEWABLE_TABLET | Freq: Once | ORAL | Status: AC
  Administered 2019-08-31: 81 mg via ORAL
  Filled 2019-08-31: qty 1

## 2019-08-31 MED ORDER — VERAPAMIL HCL 2.5 MG/ML IV SOLN
INTRA_ARTERIAL | Status: DC | PRN
  Administered 2019-08-31: 7.5 mL via INTRA_ARTERIAL

## 2019-08-31 MED ORDER — LIDOCAINE HCL (PF) 1 % IJ SOLN
INTRAMUSCULAR | Status: AC
  Filled 2019-08-31: qty 30

## 2019-08-31 MED ORDER — ATORVASTATIN CALCIUM 80 MG PO TABS: 80.0000 mg | ORAL_TABLET | Freq: Every day | ORAL | 2 refills | Status: DC

## 2019-08-31 MED ORDER — MIDAZOLAM HCL 2 MG/2ML IJ SOLN
INTRAMUSCULAR | Status: AC
  Filled 2019-08-31: qty 2

## 2019-08-31 MED ORDER — MIDAZOLAM HCL 2 MG/2ML IJ SOLN
INTRAMUSCULAR | Status: DC | PRN
  Administered 2019-08-31: 2 mg via INTRAVENOUS

## 2019-08-31 MED ORDER — HEPARIN (PORCINE) IN NACL 1000-0.9 UT/500ML-% IV SOLN
INTRAVENOUS | Status: AC
  Filled 2019-08-31: qty 1000

## 2019-08-31 MED ORDER — SODIUM CHLORIDE 0.9 % IV SOLN: 250.0000 mL | INTRAVENOUS | Status: DC | PRN

## 2019-08-31 MED ORDER — METOPROLOL SUCCINATE ER 25 MG PO TB24
25.0000 mg | ORAL_TABLET | Freq: Every evening | ORAL | Status: DC
  Administered 2019-08-31: 18:00:00 25 mg via ORAL
  Filled 2019-08-31: qty 1

## 2019-08-31 MED ORDER — SODIUM CHLORIDE 0.9% FLUSH: 3.0000 mL | INTRAVENOUS | Status: DC | PRN

## 2019-08-31 MED ORDER — SODIUM CHLORIDE 0.9% FLUSH
3.0000 mL | Freq: Two times a day (BID) | INTRAVENOUS | Status: DC
  Administered 2019-08-31 – 2019-09-01 (×2): 3 mL via INTRAVENOUS

## 2019-08-31 MED ORDER — HEPARIN SODIUM (PORCINE) 1000 UNIT/ML IJ SOLN
INTRAMUSCULAR | Status: AC
  Filled 2019-08-31: qty 1

## 2019-08-31 MED ORDER — AMLODIPINE BESYLATE 2.5 MG PO TABS: 2.5000 mg | ORAL_TABLET | ORAL | 1 refills | Status: DC

## 2019-08-31 MED ORDER — NITROGLYCERIN 0.4 MG SL SUBL: 0.4000 mg | SUBLINGUAL_TABLET | SUBLINGUAL | 3 refills | Status: DC | PRN

## 2019-08-31 MED ORDER — SODIUM CHLORIDE 0.9 % WEIGHT BASED INFUSION
1.0000 mL/kg/h | INTRAVENOUS | Status: DC
  Administered 2019-08-31: 03:00:00 1 mL/kg/h via INTRAVENOUS

## 2019-08-31 MED ORDER — INSULIN ASPART 100 UNIT/ML ~~LOC~~ SOLN
0.0000 [IU] | Freq: Three times a day (TID) | SUBCUTANEOUS | Status: DC
  Administered 2019-08-31 (×2): 3 [IU] via SUBCUTANEOUS
  Administered 2019-09-01: 2 [IU] via SUBCUTANEOUS

## 2019-08-31 MED ORDER — LIDOCAINE HCL (PF) 1 % IJ SOLN
INTRAMUSCULAR | Status: DC | PRN
  Administered 2019-08-31: 4 mL via SUBCUTANEOUS

## 2019-08-31 MED ORDER — INSULIN ASPART 100 UNIT/ML ~~LOC~~ SOLN: 0.0000 [IU] | Freq: Every day | SUBCUTANEOUS | Status: DC

## 2019-08-31 MED ORDER — SODIUM CHLORIDE 0.9 % IV SOLN
INTRAVENOUS | Status: AC
  Administered 2019-08-31: 18:00:00 via INTRAVENOUS

## 2019-08-31 MED ORDER — CLOPIDOGREL BISULFATE 75 MG PO TABS: 75.0000 mg | ORAL_TABLET | Freq: Every day | ORAL | 1 refills | Status: DC

## 2019-08-31 MED ORDER — HEPARIN (PORCINE) IN NACL 1000-0.9 UT/500ML-% IV SOLN
INTRAVENOUS | Status: DC | PRN
  Administered 2019-08-31 (×2): 500 mL

## 2019-08-31 MED ORDER — IOHEXOL 350 MG/ML SOLN
INTRAVENOUS | Status: DC | PRN
  Administered 2019-08-31: 50 mL via INTRA_ARTERIAL

## 2019-08-31 MED ORDER — VERAPAMIL HCL 2.5 MG/ML IV SOLN
INTRAVENOUS | Status: AC
  Filled 2019-08-31: qty 2

## 2019-08-31 MED ORDER — HYDROMORPHONE HCL 1 MG/ML IJ SOLN
INTRAMUSCULAR | Status: DC | PRN
  Administered 2019-08-31: 0.5 mg via INTRAVENOUS

## 2019-08-31 MED ORDER — CLOPIDOGREL BISULFATE 75 MG PO TABS
75.0000 mg | ORAL_TABLET | Freq: Once | ORAL | Status: AC
  Administered 2019-08-31: 18:00:00 75 mg via ORAL
  Filled 2019-08-31: qty 1

## 2019-08-31 SURGICAL SUPPLY — 9 items
CATH OPTITORQUE TIG 4.0 5F (CATHETERS) ×2 IMPLANT
DEVICE RAD COMP TR BAND LRG (VASCULAR PRODUCTS) ×2 IMPLANT
GLIDESHEATH SLEND A-KIT 6F 22G (SHEATH) ×2 IMPLANT
GUIDEWIRE INQWIRE 1.5J.035X260 (WIRE) ×1 IMPLANT
INQWIRE 1.5J .035X260CM (WIRE) ×2
KIT HEART LEFT (KITS) ×2 IMPLANT
PACK CARDIAC CATHETERIZATION (CUSTOM PROCEDURE TRAY) ×2 IMPLANT
TRANSDUCER W/STOPCOCK (MISCELLANEOUS) ×2 IMPLANT
TUBING CIL FLEX 10 FLL-RA (TUBING) ×2 IMPLANT

## 2019-08-31 NOTE — Progress Notes (Addendum)
Upon re-assessment of right radial site, Pt presented with severe nausea and began to vomit. Right radial site began to bleed and 4cc of air was placed back into TR band to stop bleeding. Zofran IV was given, BP 168/81, HR 741, CBG 174. Chrissy, RN came to assist and assess patient.   Dr Virgina Jock was contacted and made aware of status change. RN made Patwardhan,MD aware that pt had just taken plavix and metoprolol prior to vomiting. Patwardtohan, MD stated this was okay due  no stents placed today.  Discharge orders were discontinued.   1830: After Zofran was administered, pt resting in bed with no complaints. Right radial site assessed with no further bleeding with a total of 8cc in TR band. \

## 2019-08-31 NOTE — Progress Notes (Signed)
Pt med rec not completed for AVS. Dr [name] paged and made aware.

## 2019-08-31 NOTE — Progress Notes (Signed)
Inpatient Diabetes Program Recommendations  AACE/ADA: New Consensus Statement on Inpatient Glycemic Control (2015)  Target Ranges:  Prepandial:   less than 140 mg/dL      Peak postprandial:   less than 180 mg/dL (1-2 hours)      Critically ill patients:  140 - 180 mg/dL   Lab Results  Component Value Date   GLUCAP 211 (H) 08/30/2019   HGBA1C 8.3 (H) 08/31/2019    Review of Glycemic Control Results for Eric Cohen, Eric Cohen (MRN [id number]) as of 08/31/2019 13:19  Ref. Range 08/30/2019 22:10 08/30/2019 23:11  Glucose-Capillary Latest Ref Range: 70 - 99 mg/dL 217 (H) 211 (H)   Diabetes history: Type 2 DM Outpatient Diabetes medications: Lantus 68 units QHS Current orders for Inpatient glycemic control: None  Inpatient Diabetes Program Recommendations:    If patient to remain inpatient consider:  - Adding Novolog 0-9 units Q4H if to remain NPO (TID & HS when diet resumes)   If patient's glucose trends are >180-200 mg/dL, consider restarting portion of patient's home basal insulin. Lantus 20 units QD.   Secure chat sent to MD.  Thanks, Bronson Curb, MSN, RNC-OB Diabetes Coordinator [phone] (8a-5p)

## 2019-08-31 NOTE — Progress Notes (Signed)
[place] for Heparin  Indication: chest pain/ACS  Allergies  Allergen Reactions  . Shellfish Allergy Anaphylaxis    "close my throat"    Patient Measurements: Height: 5\' 11"  (180.3 cm) Weight: 240 lb 11.2 oz (109.2 kg) IBW/kg (Calculated) : 75.3 Heparin Dosing Weight: 98.4 kg   Vital Signs: Temp: 97.9 F (36.6 C) (11/06 0341) Temp Source: Oral (11/06 0341) BP: 150/88 (11/06 0800) Pulse Rate: 69 (11/06 0801)  Labs: Recent Labs    08/30/19 1736 08/30/19 1930 08/31/19 0319 08/31/19 1045  HGB 13.6  --  13.4  --   HCT 39.5  --  38.7*  --   PLT 246  --  174  --   HEPARINUNFRC  --   --  0.30 0.18*  CREATININE 1.00  --   --   --   TROPONINIHS 3 4  --   --     Estimated Creatinine Clearance: 97.5 mL/min (by C-G formula based on SCr of 1 mg/dL).   Medical History: Past Medical History:  Diagnosis Date  . Asthma   . Diabetes mellitus without complication ([place])   . Diverticulitis   . DVT (deep venous thrombosis) ([place])   . Hypertension     Medications:  Scheduled:  . aspirin EC  81 mg Oral Daily  . atorvastatin  80 mg Oral q1800  . metoprolol tartrate  25 mg Oral BID  . sodium chloride flush  3 mL Intravenous Once  . sodium chloride flush  3 mL Intravenous Q12H    Assessment: Patient is a 15 yom that presented to the ED with chest pain relieved by NTG. Patient is being admitted for unstable angina and for cath today. Pharmacy dosing heparin -heparin level= 0.18  Goal of Therapy:  Heparin level 0.3-0.7 units/ml Monitor platelets by anticoagulation protocol: Yes   Plan:  Increase heparin to 1450 units/hr Will follow plans post cath  Hildred Laser, PharmD Clinical Pharmacist **Pharmacist phone directory can now be found on [email or web address] ([email or web address]).  Listed under [place].

## 2019-08-31 NOTE — Progress Notes (Signed)
[place] for Heparin  Indication: chest pain/ACS  Allergies  Allergen Reactions  . Shellfish Allergy Anaphylaxis    "close my throat"    Patient Measurements: Height: 5\' 11"  (180.3 cm) Weight: 240 lb 11.2 oz (109.2 kg) IBW/kg (Calculated) : 75.3 Heparin Dosing Weight: 98.4 kg   Vital Signs: Temp: 97.9 F (36.6 C) (11/06 0341) Temp Source: Oral (11/06 0341) BP: 147/88 (11/06 0341) Pulse Rate: 69 (11/06 0341)  Labs: Recent Labs    08/30/19 1736 08/30/19 1930 08/31/19 0319  HGB 13.6  --   --   HCT 39.5  --   --   PLT 246  --   --   HEPARINUNFRC  --   --  0.30  CREATININE 1.00  --   --   TROPONINIHS 3 4  --     Estimated Creatinine Clearance: 97.5 mL/min (by C-G formula based on SCr of 1 mg/dL).   Medical History: Past Medical History:  Diagnosis Date  . Asthma   . Diabetes mellitus without complication ([place])   . Diverticulitis   . DVT (deep venous thrombosis) ([place])   . Hypertension     Medications:  Scheduled:  . aspirin EC  81 mg Oral Daily  . atorvastatin  80 mg Oral q1800  . metoprolol tartrate  25 mg Oral BID  . sodium chloride flush  3 mL Intravenous Once  . sodium chloride flush  3 mL Intravenous Q12H    Assessment: Patient is a 9 yom that presented to the ED with chest pain relieved by NTG. Patient is being admitted for unstable angina and will receive a cardiac cath due to concern for coronary atherosclerosis.   11/6 AM update:  Initial heparin level therapeutic   Goal of Therapy:  Heparin level 0.3-0.7 units/ml Monitor platelets by anticoagulation protocol: Yes   Plan:  Cont heparin at 1200 units/hr Confirmatory heparin level at [place], PharmD, [place] Pharmacist Phone: [phone]

## 2019-08-31 NOTE — Progress Notes (Signed)
COVID swab performed. Roselyn Reef in main lab aware and stated results would be available ASAP.

## 2019-08-31 NOTE — Progress Notes (Signed)
Paged Dr [name] patient had arrived to room and that patient did not know he was possibly going for a heart cath 11/6. No call back, explained to the patient that MD was going to explain the procedure in the morning.

## 2019-08-31 NOTE — Interval H&P Note (Signed)
History and Physical Interval Note:  08/31/2019 4:36 PM  Eric Cohen  has presented today for surgery, with the diagnosis of Unstable angina.  The various methods of treatment have been discussed with the patient and family. After consideration of risks, benefits and other options for treatment, the patient has consented to  Procedure(s): CORONARY ANGIOGRAPHY and possible angioplasty (N/A) as a surgical intervention.  The patient's history has been reviewed, patient examined, no change in status, stable for surgery.  I have reviewed the patient's chart and labs.  Questions were answered to the patient's satisfaction.     Adrian Prows  Cath Lab Visit (complete for each Cath Lab visit)  Clinical Evaluation Leading to the Procedure:   ACS: Yes.    Non-ACS:    Anginal Classification: CCS IV  Anti-ischemic medical therapy: Minimal Therapy (1 class of medications)  Non-Invasive Test Results: No non-invasive testing performed  Prior CABG: No previous CABG

## 2019-08-31 NOTE — Progress Notes (Signed)
Patient stated having 6/10 chest pain, increased iv nitro to 20 mcg. Patient stated that chest pain had subsided, will continue to monitor patient. Gave po tylenol for headache. BP 137/89 HR 68

## 2019-09-01 ENCOUNTER — Other Ambulatory Visit: Payer: Self-pay | Admitting: Cardiology

## 2019-09-01 DIAGNOSIS — E119 Type 2 diabetes mellitus without complications: Secondary | ICD-10-CM

## 2019-09-01 DIAGNOSIS — I200 Unstable angina: Secondary | ICD-10-CM

## 2019-09-01 DIAGNOSIS — I10 Essential (primary) hypertension: Secondary | ICD-10-CM | POA: Diagnosis not present

## 2019-09-01 LAB — CBC
HCT: 35.6 % — ABNORMAL LOW (ref 39.0–52.0)
Hemoglobin: 12.5 g/dL — ABNORMAL LOW (ref 13.0–17.0)
MCH: 30.3 pg (ref 26.0–34.0)
MCHC: 35.1 g/dL (ref 30.0–36.0)
MCV: 86.4 fL (ref 80.0–100.0)
Platelets: 181 10*3/uL (ref 150–400)
RBC: 4.12 MIL/uL — ABNORMAL LOW (ref 4.22–5.81)
RDW: 17.1 % — ABNORMAL HIGH (ref 11.5–15.5)
WBC: 10.9 10*3/uL — ABNORMAL HIGH (ref 4.0–10.5)
nRBC: 0 % (ref 0.0–0.2)

## 2019-09-01 LAB — HEPARIN LEVEL (UNFRACTIONATED): Heparin Unfractionated: <0.1 IU/mL — ABNORMAL LOW (ref 0.30–0.70)

## 2019-09-01 LAB — GLUCOSE, CAPILLARY: Glucose-Capillary: 150 mg/dL — ABNORMAL HIGH (ref 70–99)

## 2019-09-01 MED ORDER — LISINOPRIL 5 MG PO TABS: 5.0000 mg | ORAL_TABLET | Freq: Every day | ORAL | 3 refills | Status: DC

## 2019-09-01 MED ORDER — METOPROLOL SUCCINATE ER 25 MG PO TB24: 25.0000 mg | ORAL_TABLET | Freq: Every day | ORAL | 3 refills | Status: DC

## 2019-09-01 MED ORDER — CLOPIDOGREL BISULFATE 75 MG PO TABS
75.0000 mg | ORAL_TABLET | Freq: Every day | ORAL | Status: DC
  Administered 2019-09-01: 08:00:00 75 mg via ORAL
  Filled 2019-09-01: qty 1

## 2019-09-01 NOTE — Progress Notes (Signed)
Pt ambulated in hall with RN. Tolerated well with no signs of distress or complaints. VS in foresheets.

## 2019-09-01 NOTE — Discharge Summary (Signed)
Physician Discharge Summary  Patient ID: TAAJ HURLBUT MRN: [id number] DOB/AGE: Mar 02, 1956 63 y.o.  Admit date: 08/30/2019 Discharge date: 09/01/2019  Primary Discharge Diagnosis: Unstable angina  Secondary Discharge Diagnosis: Hypertension Type 2 DM   Hospital Course:   63 y.o. African American male  with hypertension, type 2 DM, admitted with unstable angina. He underwent coronary angiogram, details below. Medical treatment was recommended. He has nausea overnight on 11/6, which improved. Patient is feeling well, and is chest pain free this morning. He is being discharged on Aspirin, plavix, metoprolol succinate, lisinopril 5 mg, atorvastatin 80 mg daily. Will consider Jardiance outpatient.   Outpatient BMP and f/u arranged. Previously scheduled stress test on 11/16 was canceled.   Did the patient have an acute coronary syndrome (MI, NSTEMI, STEMI, etc) this admission?:  Yes. Unstable angina                           AHA/ACC Clinical Performance & Quality Measures: 1. Aspirin prescribed? - Yes 2. ADP Receptor Inhibitor (Plavix/Clopidogrel, Brilinta/Ticagrelor or Effient/Prasugrel) prescribed (includes medically managed patients)? - Yes 3. Beta Blocker prescribed? - Yes 4. High Intensity Statin (Lipitor 40-80mg  or Crestor 20-40mg ) prescribed? - Yes 5. EF assessed during THIS hospitalization? - Yes 6. For EF <40%, was ACEI/ARB prescribed? - Not Applicable (EF >/= 32%) 7. For EF <40%, Aldosterone Antagonist (Spironolactone or Eplerenone) prescribed? - Not Applicable (EF >/= 95%) 8. Cardiac Rehab Phase II ordered (Included Medically managed Patients)? - Yes (Will arrange outpatient)   Discharge Exam: Blood pressure 130/81, pulse 68, temperature 98.2 F (36.8 C), temperature source Oral, resp. rate 15, height 5\' 11"  (1.803 m), weight 109.2 kg, SpO2 98 %.   Physical Exam  Constitutional: He is oriented to person, place, and time. He appears well-developed and well-nourished. No  distress.  HENT:  Head: Normocephalic and atraumatic.  Eyes: Pupils are equal, round, and reactive to light. Conjunctivae are normal.  Neck: No JVD present.  Cardiovascular: Normal rate, regular rhythm and intact distal pulses.  Pulmonary/Chest: Effort normal and breath sounds normal. He has no wheezes. He has no rales.  Abdominal: Soft. Bowel sounds are normal. There is no rebound.  Musculoskeletal:        General: No edema.  Lymphadenopathy:    He has no cervical adenopathy.  Neurological: He is alert and oriented to person, place, and time. No cranial nerve deficit.  Skin: Skin is warm and dry.  Psychiatric: He has a normal mood and affect.  Nursing note and vitals reviewed.   Significant Diagnostic Studies:  EKG 08/31/2019: Sinus rhythm 66 bpm. Normal EKG.  Echocardiogram 08/21/2019: Left ventricle cavity is normal in size. Mild concentric hypertrophy of the left ventricle. Normal LV systolic function with visual EF 50-55%. Normal global wall motion. Doppler evidence of grade I (impaired) diastolic dysfunction, normal LAP. Calculated EF 50%. Trileaflet aortic valve with mild aortic valve leaflet thickening. Mild (Grade I) aortic regurgitation. The IVC is not well visualized.  Left heart catheterization 08/31/2019: Right dominant circulation, no significant disease. Left main: Normal.  LAD: Mild diffuse disease, mid segment has a 20% stenosis.  Mild calcification is evident.  Circumflex: Mild disease in the proximal and mid segment.  OM 2 which is a small to moderate-sized vessel, has a acute angle with a 90% ostial stenosis. LV: Normal LVEDP, EF 55 to 65% without wall motion abnormality.  Recommendation: Patient's acute angle takeoff of the obtuse marginal which is a small sized vessel  needs medical therapy only.  Patient is not on a statin or beta-blocker, I will initiate high-dose high intensity statin along with aspirin and Plavix given his unstable angina, suspect coronary  spasm to be the etiology.  I discussed the findings with his wife and recommended medical therapy only for now.  He can be discharged home with outpatient follow-up.  We have discussed regarding lifestyle modification and weight loss as well.  50 mL contrast utilized.  Labs:   Lab Results  Component Value Date   WBC 10.9 (H) 09/01/2019   HGB 12.5 (L) 09/01/2019   HCT 35.6 (L) 09/01/2019   MCV 86.4 09/01/2019   PLT 181 09/01/2019    Recent Labs  Lab 08/30/19 1736  NA 135  K 4.1  CL 101  CO2 24  BUN 14  CREATININE 1.00  CALCIUM 9.0  GLUCOSE 254*    Lipid Panel     Component Value Date/Time   CHOL 181 08/31/2019 1045   TRIG 157 (H) 08/31/2019 1045   HDL 36 (L) 08/31/2019 1045   CHOLHDL 5.0 08/31/2019 1045   VLDL 31 08/31/2019 1045   LDLCALC 114 (H) 08/31/2019 1045    BNP (last 3 results) No results for input(s): BNP in the last 8760 hours.  HEMOGLOBIN A1C Lab Results  Component Value Date   HGBA1C 8.3 (H) 08/31/2019   MPG 191.51 08/31/2019    Cardiac Panel (last 3 results) No results for input(s): CKTOTAL, CKMB, TROPONINI, RELINDX in the last 8760 hours.  No results found for: CKTOTAL, CKMB, CKMBINDEX, TROPONINI   TSH Recent Labs    08/31/19 0820  TSH 0.691    Radiology: Dg Chest 2 View  Result Date: 08/30/2019 CLINICAL DATA:  Chest pain EXAM: CHEST - 2 VIEW COMPARISON:  03/23/2016 FINDINGS: The heart size and mediastinal contours are within normal limits. Both lungs are clear. The visualized skeletal structures are unremarkable. IMPRESSION: No active cardiopulmonary disease. Electronically Signed   By: Jasmine PangKim  Fujinaga M.D.   On: 08/30/2019 18:01   Ct Angio Chest Pe W/cm &/or Wo Cm  Result Date: 08/30/2019 CLINICAL DATA:  63 year old male with acute LEFT chest pain. EXAM: CT ANGIOGRAPHY CHEST WITH CONTRAST TECHNIQUE: Multidetector CT imaging of the chest was performed using the standard protocol during bolus administration of intravenous contrast.  Multiplanar CT image reconstructions and MIPs were obtained to evaluate the vascular anatomy. CONTRAST:  100mL OMNIPAQUE IOHEXOL 350 MG/ML SOLN COMPARISON:  None. FINDINGS: Cardiovascular: This is a technically adequate study. Motion artifact, especially in the LOWER lungs slightly decreases sensitivity. No pulmonary emboli are identified. Cardiomegaly noted. Coronary artery atherosclerotic calcifications are present. No thoracic aortic aneurysm or pericardial effusion. Mediastinum/Nodes: No enlarged mediastinal, hilar, or axillary lymph nodes. Thyroid gland, trachea, and esophagus demonstrate no significant findings. Lungs/Pleura: Lungs are clear. No pleural effusion or pneumothorax. Upper Abdomen: No acute abnormality. Hepatic steatosis again noted. Musculoskeletal: No acute or suspicious bony abnormalities noted. Review of the MIP images confirms the above findings. IMPRESSION: 1. No evidence of acute abnormality. No evidence of pulmonary emboli. 2. Cardiomegaly and coronary artery disease. 3. Hepatic steatosis. Electronically Signed   By: Harmon PierJeffrey  Hu M.D.   On: 08/30/2019 20:29      FOLLOW UP PLANS AND APPOINTMENTS Discharge Instructions    Diet - low sodium heart healthy   Complete by: As directed    Increase activity slowly   Complete by: As directed      Allergies as of 09/01/2019      Reactions  Shellfish Allergy Anaphylaxis   "close my throat"      Medication List    TAKE these medications   albuterol 108 (90 Base) MCG/ACT inhaler Commonly known as: VENTOLIN HFA Inhale 2 puffs into the lungs every 6 (six) hours as needed for wheezing.   albuterol (2.5 MG/3ML) 0.083% nebulizer solution Commonly known as: PROVENTIL Take 2.5 mg by nebulization every 6 (six) hours as needed for wheezing or shortness of breath.   aspirin EC 81 MG tablet Take 1 tablet (81 mg total) by mouth daily. What changed: Another medication with the same name was removed. Continue taking this medication, and  follow the directions you see here.   atorvastatin 80 MG tablet Commonly known as: LIPITOR Take 1 tablet (80 mg total) by mouth daily at 6 PM.   clopidogrel 75 MG tablet Commonly known as: Plavix Take 1 tablet (75 mg total) by mouth daily.   gabapentin 300 MG capsule Commonly known as: NEURONTIN Take 300 mg by mouth at bedtime.   insulin glargine 100 UNIT/ML injection Commonly known as: LANTUS Inject 68 Units into the skin at bedtime.   lisinopril 5 MG tablet Commonly known as: ZESTRIL Take 1 tablet (5 mg total) by mouth daily.   metoprolol succinate 25 MG 24 hr tablet Commonly known as: Toprol XL Take 1 tablet (25 mg total) by mouth daily.   nitroGLYCERIN 0.4 MG SL tablet Commonly known as: NITROSTAT Place 1 tablet (0.4 mg total) under the tongue every 5 (five) minutes as needed for chest pain.   SYMBICORT IN Inhale 2 puffs into the lungs daily.   Vitamin D (Ergocalciferol) 1.25 MG (50000 UT) Caps capsule Commonly known as: DRISDOL Take 50,000 Units by mouth every 7 (seven) days. New Rx to start taking on Sunday      Follow-up Information    Toniann Fail, NP Follow up on 09/14/2019.   Specialty: Cardiology Why: 10:30 AM Contact information: [address] [place] [address] [phone]            Truett Mainland MD, [facility] Pager: [phone] Office: [phone] If no answer: [phone]

## 2019-09-03 ENCOUNTER — Encounter (HOSPITAL_COMMUNITY): Payer: Self-pay | Admitting: Cardiology

## 2019-09-06 ENCOUNTER — Other Ambulatory Visit: Payer: Self-pay | Admitting: Cardiology

## 2019-09-10 ENCOUNTER — Other Ambulatory Visit: Payer: 59

## 2019-09-11 ENCOUNTER — Telehealth: Payer: Self-pay

## 2019-09-11 NOTE — Telephone Encounter (Signed)
Location of hospitalization: [place] Reason for hospitalization: Unstable angina Date of discharge: 09/01/2019 Date of first communication with patient: today Person contacting patient: Me Current symptoms: None; Pt is doing much better and taking current meds Do you understand why you were in the Hospital: Yes Questions regarding discharge instructions: None Where were you discharged to: Home Medications reviewed: Yes Allergies reviewed: Yes Dietary changes reviewed: Yes. Discussed low fat and low salt diet.  Referals reviewed: NA Activities of Daily Living: Able to with mild limitations Any transportation issues/concerns: None Any patient concerns: None Confirmed importance & date/time of Follow up appt: Yes Confirmed with patient if condition begins to worsen call. Pt was given the office number and encouraged to call back with questions or concerns: Yes

## 2019-09-14 ENCOUNTER — Ambulatory Visit (INDEPENDENT_AMBULATORY_CARE_PROVIDER_SITE_OTHER): Payer: 59 | Admitting: Cardiology

## 2019-09-14 ENCOUNTER — Encounter: Payer: Self-pay | Admitting: Cardiology

## 2019-09-14 ENCOUNTER — Other Ambulatory Visit: Payer: Self-pay

## 2019-09-14 VITALS — BP 137/81 | HR 70 | Temp 98.0°F | Ht 71.5 in | Wt 239.0 lb

## 2019-09-14 DIAGNOSIS — I10 Essential (primary) hypertension: Secondary | ICD-10-CM

## 2019-09-14 DIAGNOSIS — E782 Mixed hyperlipidemia: Secondary | ICD-10-CM

## 2019-09-14 DIAGNOSIS — Z794 Long term (current) use of insulin: Secondary | ICD-10-CM

## 2019-09-14 DIAGNOSIS — Z86718 Personal history of other venous thrombosis and embolism: Secondary | ICD-10-CM

## 2019-09-14 DIAGNOSIS — E119 Type 2 diabetes mellitus without complications: Secondary | ICD-10-CM

## 2019-09-14 DIAGNOSIS — I25118 Atherosclerotic heart disease of native coronary artery with other forms of angina pectoris: Secondary | ICD-10-CM

## 2019-09-14 MED ORDER — JARDIANCE 10 MG PO TABS: 10.0000 mg | ORAL_TABLET | Freq: Every day | ORAL | 1 refills | Status: DC

## 2019-09-14 MED ORDER — ISOSORBIDE MONONITRATE ER 30 MG PO TB24: 30.0000 mg | ORAL_TABLET | Freq: Every day | ORAL | 3 refills | Status: DC

## 2019-09-14 NOTE — Progress Notes (Signed)
Primary Physician:  Leilani Ableeese, Betti, MD   Patient ID: Eric Cohen, male    DOB: 1956/01/10, 63 y.o.   MRN: [id number]  Subjective:    Chief Complaint  Patient presents with  . Chest Pain  . Follow-up    4 weeks    HPI: Eric Cohen  is a 63 y.o. male  with history of right DVT in April 2020 treated with Xarelto, and type 2 diabetes.  Patient was recently seen for concerning symptoms of angina.  He is scheduled for further work-up; however, presented to the emergency room on 08/30/2019 with central chest pain.  Symptoms felt to be related to unstable angina, underwent coronary angiogram on 08/31/2019 that revealed acute angle takeoff of the obtuse marginal, which is a small sized vessel, medical management was recommended.  He was discharged on statin, aspirin and Plavix.  He now presents for follow-up.  Since discharge, patient states that he is doing fair.  He has noticed some chest discomfort with trying to do some exercises outside in the cold weather.  No nitroglycerin use.  Tolerating medications well.  He states that he is fairly active with yard work and gardening that he tolerates well. He is unsure of any family history of heart disease. States that his father died in his sleep, unsure of cause, not at a early age. Patient was in the [employer] for 13 years.   Past Medical History:  Diagnosis Date  . Asthma   . Diabetes mellitus without complication (HCC)   . Diverticulitis   . DVT (deep venous thrombosis) (HCC)   . Hypertension     Past Surgical History:  Procedure Laterality Date  . APPENDECTOMY    . CHOLECYSTECTOMY    . COLOSTOMY REVERSAL    . KNEE SURGERY    . LEFT HEART CATH AND CORONARY ANGIOGRAPHY N/A 08/31/2019   Procedure: LEFT HEART CATH AND CORONARY ANGIOGRAPHY;  Surgeon: Yates DecampGanji, Jay, MD;  Location: [facility] INVASIVE [facility] LAB;  Service: Cardiovascular;  Laterality: N/A;    Social History   Socioeconomic History  . Marital status: Married    Spouse name: Not on file   . Number of children: 1  . Years of education: Not on file  . Highest education level: Not on file  Occupational History  . Not on file  Social Needs  . Financial resource strain: Not on file  . Food insecurity    Worry: Not on file    Inability: Not on file  . Transportation needs    Medical: Not on file    Non-medical: Not on file  Tobacco Use  . Smoking status: Never Smoker  . Smokeless tobacco: Never Used  Substance and Sexual Activity  . Alcohol use: No  . Drug use: No  . Sexual activity: Not on file  Lifestyle  . Physical activity    Days per week: Not on file    Minutes per session: Not on file  . Stress: Not on file  Relationships  . Social Musicianconnections    Talks on phone: Not on file    Gets together: Not on file    Attends religious service: Not on file    Active member of club or organization: Not on file    Attends meetings of clubs or organizations: Not on file    Relationship status: Not on file  . Intimate partner violence    Fear of current or ex partner: Not on file    Emotionally abused: Not  on file    Physically abused: Not on file    Forced sexual activity: Not on file  Other Topics Concern  . Not on file  Social History Narrative  . Not on file    Review of Systems  Constitution: Negative for decreased appetite, malaise/fatigue, weight gain and weight loss.  Eyes: Negative for visual disturbance.  Cardiovascular: Negative for chest pain, claudication, dyspnea on exertion, leg swelling, orthopnea, palpitations and syncope.  Respiratory: Negative for hemoptysis and wheezing.   Endocrine: Negative for cold intolerance and heat intolerance.  Hematologic/Lymphatic: Does not bruise/bleed easily.  Skin: Negative for nail changes.  Musculoskeletal: Negative for muscle weakness and myalgias.  Gastrointestinal: Negative for abdominal pain, change in bowel habit, nausea and vomiting.  Neurological: Negative for difficulty with concentration, dizziness,  focal weakness and headaches.  Psychiatric/Behavioral: Negative for altered mental status and suicidal ideas.  All other systems reviewed and are negative.     Objective:  Blood pressure 137/81, pulse 70, temperature 98 F (36.7 C), height 5' 11.5" (1.816 m), weight 239 lb (108.4 kg), SpO2 98 %. Body mass index is 32.87 kg/m.    Physical Exam  Constitutional: He is oriented to person, place, and time. Vital signs are normal. He appears well-developed and well-nourished.  HENT:  Head: Normocephalic and atraumatic.  Neck: Normal range of motion.  Cardiovascular: Normal rate, regular rhythm, normal heart sounds and intact distal pulses.  Pulmonary/Chest: Effort normal and breath sounds normal. No accessory muscle usage. No respiratory distress.  Abdominal: Soft. Bowel sounds are normal.  Musculoskeletal: Normal range of motion.  Neurological: He is alert and oriented to person, place, and time.  Skin: Skin is warm and dry.  Vitals reviewed.  Radiology: No results found.  Laboratory examination:   11/09/2018: Cholesterol 144, triglycerides 109, HDL 46, LDL 79.  CMP Latest Ref Rng & Units 08/30/2019 02/28/2019 02/06/2019  Glucose 70 - 99 mg/dL 914(N) 829(F) 621(H)  BUN 8 - 23 mg/dL 14 14 13   Creatinine 0.61 - 1.24 mg/dL 0.86) 5.78(I  Sodium 135 - 145 mmol/L 135 136 135  Potassium 3.5 - 5.1 mmol/L 4.1 3.8 4.0  Chloride 98 - 111 mmol/L 101 103 102  CO2 22 - 32 mmol/L 24 26 26   Calcium 8.9 - 10.3 mg/dL 9.0 9.0 9.0  Total Protein 6.5 - 8.1 g/dL - 7.5 -  Total Bilirubin 0.3 - 1.2 mg/dL - 1.2 -  Alkaline Phos 38 - 126 U/L - 82 -  AST 15 - 41 U/L - 31 -  ALT 0 - 44 U/L - 34 -   CBC Latest Ref Rng & Units 09/01/2019 08/31/2019 08/30/2019  WBC 4.0 - 10.5 K/uL 10.9(H) 11.0(H) 11.8(H)  Hemoglobin 13.0 - 17.0 g/dL 12.5(L) 13.4 13.6  Hematocrit 39.0 - 52.0 % 35.6(L) 38.7(L) 39.5  Platelets 150 - 400 K/uL 181 174 246   Lipid Panel     Component Value Date/Time   CHOL 181 08/31/2019  1045   TRIG 157 (H) 08/31/2019 1045   HDL 36 (L) 08/31/2019 1045   CHOLHDL 5.0 08/31/2019 1045   VLDL 31 08/31/2019 1045   LDLCALC 114 (H) 08/31/2019 1045   HEMOGLOBIN A1C Lab Results  Component Value Date   HGBA1C 8.3 (H) 08/31/2019   MPG 191.51 08/31/2019   TSH Recent Labs    08/31/19 0820  TSH 0.691    PRN Meds:. There are no discontinued medications. Current Meds  Medication Sig  . albuterol (PROVENTIL HFA;VENTOLIN HFA) 108 (90 BASE) MCG/ACT inhaler  Inhale 2 puffs into the lungs every 6 (six) hours as needed for wheezing.  Marland Kitchen albuterol (PROVENTIL) (2.5 MG/3ML) 0.083% nebulizer solution Take 2.5 mg by nebulization every 6 (six) hours as needed for wheezing or shortness of breath.  Marland Kitchen aspirin 81 MG EC tablet TAKE 1 TABLET BY MOUTH EVERY DAY  . atorvastatin (LIPITOR) 80 MG tablet Take 1 tablet (80 mg total) by mouth daily at 6 PM.  . Budesonide-Formoterol Fumarate (SYMBICORT IN) Inhale 2 puffs into the lungs daily.   . clopidogrel (PLAVIX) 75 MG tablet Take 1 tablet (75 mg total) by mouth daily.  Marland Kitchen gabapentin (NEURONTIN) 300 MG capsule Take 300 mg by mouth at bedtime.  . insulin glargine (LANTUS) 100 UNIT/ML injection Inject 68 Units into the skin at bedtime.  Marland Kitchen lisinopril (ZESTRIL) 5 MG tablet Take 1 tablet (5 mg total) by mouth daily.  . metoprolol succinate (TOPROL XL) 25 MG 24 hr tablet Take 1 tablet (25 mg total) by mouth daily.  . nitroGLYCERIN (NITROSTAT) 0.4 MG SL tablet Place 1 tablet (0.4 mg total) under the tongue every 5 (five) minutes as needed for chest pain.  . Vitamin D, Ergocalciferol, (DRISDOL) 1.25 MG (50000 UT) CAPS capsule Take 50,000 Units by mouth every 7 (seven) days. New Rx to start taking on Sunday    Cardiac Studies:   Echocardiogram 08/21/2019: Left ventricle cavity is normal in size. Mild concentric hypertrophy of the left ventricle. Normal LV systolic function with visual EF 50-55%. Normal global wall motion. Doppler evidence of grade I (impaired)  diastolic dysfunction, normal LAP. Calculated EF 50%. Trileaflet aortic valve with mild aortic valve leaflet thickening. Mild (Grade I) aortic regurgitation. The IVC is not well visualized.  Left heart catheterization 08/31/2019: Right dominant circulation, no significant disease. Left main: Normal.  LAD: Mild diffuse disease, mid segment has a 20% stenosis.  Mild calcification is evident.  Circumflex: Mild disease in the proximal and mid segment.  OM 2 which is a small to moderate-sized vessel, has a acute angle with a 90% ostial stenosis. LV: Normal LVEDP, EF 55 to 65% without wall motion abnormality.  Assessment:   Atherosclerosis of native coronary artery of native heart with stable angina pectoris ([place]) - Plan: Lipid Profile  Essential hypertension  History of DVT (deep vein thrombosis)  Mixed hyperlipidemia  Type 2 diabetes mellitus without complication, with long-term current use of insulin ([place])  EKG 08/09/2019: Normal sinus rhythm at 80 bpm, normal axis, possible old anterior infarct. T wave flattening in inferolateral leads, cannot exclude ischemia.   Recommendations:   Patient is doing well since being discharged from the hospital, he does continue to have some chest discomfort with being outside in the cold weather.  We will start him on Imdur 30 mg daily.  He has been monitoring his blood pressure at home and is been well controlled.  He is also trying to make diet changes to help with his diabetes.  He benefit from [place] in view of heart disease and diabetes.  I will start him on 10 mg daily.  Encouraged him to stay well-hydrated with this medication.  I have also encouraged that he follow-up with Dr. Ayesha Rumpf in the next few weeks for continued management of his diabetes.  He was started on Lipitor at discharge, he will need repeat lipids in 3 months.  Will see him back in 3 months with lipids at that time.    Miquel Dunn, MSN, APRN, FNP-C [facility]  Cardiovascular. [place] Office: [phone] Fax:  [phone] 

## 2019-09-26 ENCOUNTER — Telehealth: Payer: Self-pay

## 2019-09-26 NOTE — Telephone Encounter (Signed)
Please set up a visit with any one of [place] in a day or two.

## 2019-09-26 NOTE — Telephone Encounter (Signed)
Pt called to inform us that he is still having chest discomfort and some pain. Pt mention he is having pain while sitting doing nothing.  Pt has an appt in 3 months and will like to know what should he do.

## 2019-10-01 ENCOUNTER — Telehealth: Payer: Self-pay | Admitting: Cardiology

## 2019-10-01 NOTE — Telephone Encounter (Signed)
LVM awaiting call back

## 2019-10-03 ENCOUNTER — Other Ambulatory Visit: Payer: Self-pay

## 2019-10-03 ENCOUNTER — Encounter: Payer: Self-pay | Admitting: Cardiology

## 2019-10-03 ENCOUNTER — Ambulatory Visit (INDEPENDENT_AMBULATORY_CARE_PROVIDER_SITE_OTHER): Payer: 59 | Admitting: Cardiology

## 2019-10-03 VITALS — BP 152/81 | HR 75 | Ht 71.0 in | Wt 282.0 lb

## 2019-10-03 DIAGNOSIS — I10 Essential (primary) hypertension: Secondary | ICD-10-CM | POA: Diagnosis not present

## 2019-10-03 DIAGNOSIS — E782 Mixed hyperlipidemia: Secondary | ICD-10-CM | POA: Diagnosis not present

## 2019-10-03 DIAGNOSIS — I25118 Atherosclerotic heart disease of native coronary artery with other forms of angina pectoris: Secondary | ICD-10-CM

## 2019-10-03 DIAGNOSIS — R079 Chest pain, unspecified: Secondary | ICD-10-CM | POA: Diagnosis not present

## 2019-10-03 MED ORDER — ISOSORBIDE MONONITRATE ER 30 MG PO TB24: 30.0000 mg | ORAL_TABLET | Freq: Every day | ORAL | 3 refills | Status: DC

## 2019-10-03 MED ORDER — ATORVASTATIN CALCIUM 80 MG PO TABS: 80.0000 mg | ORAL_TABLET | Freq: Every day | ORAL | 2 refills | Status: DC

## 2019-10-03 MED ORDER — LOSARTAN POTASSIUM 25 MG PO TABS: 25.0000 mg | ORAL_TABLET | Freq: Every day | ORAL | 2 refills | Status: DC

## 2019-10-03 MED ORDER — CLOPIDOGREL BISULFATE 75 MG PO TABS: 75.0000 mg | ORAL_TABLET | Freq: Every day | ORAL | 1 refills | Status: DC

## 2019-10-03 MED ORDER — METOPROLOL SUCCINATE ER 50 MG PO TB24: 50.0000 mg | ORAL_TABLET | Freq: Every day | ORAL | 2 refills | Status: DC

## 2019-10-03 NOTE — Progress Notes (Signed)
Primary Physician:  Lin Landsman, MD   Patient ID: Eric Cohen, male    DOB: 06-Nov-1955, 63 y.o.   MRN: [id number]  Subjective:    Chief Complaint  Patient presents with  . Chest Pain  . Cough    HPI: CARR SHARTZER  is a 63 y.o.  AA male  with history of right DVT in April 2020 treated with Xarelto, type 2 diabetes, hypertension, evaluated by [place] in the outpatient basis was scheduled for a stress test, presented to the hospital on 08/31/2019 with unstable angina pectoris for which he underwent cardiac catheterization and was found to have single-vessel coronary disease and  recommended medical therapy. He now presents to the office for follow-up.  States that since hospital discharge he has had constant dry cough for which he has discontinued all his medications.  He has used 2 s/l NTG since hospital discharge for angina pectoris.  He has chronic dyspnea from asthma.   Past Medical History:  Diagnosis Date  . Asthma   . Diabetes mellitus without complication ([place])   . Diverticulitis   . DVT (deep venous thrombosis) ([place])   . Hypertension     Past Surgical History:  Procedure Laterality Date  . APPENDECTOMY    . CHOLECYSTECTOMY    . COLOSTOMY REVERSAL    . KNEE SURGERY    . LEFT HEART CATH AND CORONARY ANGIOGRAPHY N/A 08/31/2019   Procedure: LEFT HEART CATH AND CORONARY ANGIOGRAPHY;  Surgeon: Adrian Prows, MD;  Location: [facility];  Service: Cardiovascular;  Laterality: N/A;    Social History   Socioeconomic History  . Marital status: Married    Spouse name: Not on file  . Number of children: 1  . Years of education: Not on file  . Highest education level: Not on file  Occupational History  . Not on file  Social Needs  . Financial resource strain: Not on file  . Food insecurity    Worry: Not on file    Inability: Not on file  . Transportation needs    Medical: Not on file    Non-medical: Not on file  Tobacco Use  . Smoking status: Never Smoker  .  Smokeless tobacco: Never Used  Substance and Sexual Activity  . Alcohol use: No  . Drug use: No  . Sexual activity: Not on file  Lifestyle  . Physical activity    Days per week: Not on file    Minutes per session: Not on file  . Stress: Not on file  Relationships  . Social Herbalist on phone: Not on file    Gets together: Not on file    Attends religious service: Not on file    Active member of club or organization: Not on file    Attends meetings of clubs or organizations: Not on file    Relationship status: Not on file  . Intimate partner violence    Fear of current or ex partner: Not on file    Emotionally abused: Not on file    Physically abused: Not on file    Forced sexual activity: Not on file  Other Topics Concern  . Not on file  Social History Narrative  . Not on file   Review of Systems  Constitution: Positive for malaise/fatigue. Negative for chills, decreased appetite and weight gain.  Cardiovascular: Positive for chest pain. Negative for dyspnea on exertion, leg swelling and syncope.  Endocrine: Negative for cold intolerance.  Hematologic/Lymphatic: Does not bruise/bleed easily.  Musculoskeletal: Negative for joint swelling.  Gastrointestinal: Negative for abdominal pain, anorexia, change in bowel habit, hematochezia and melena.  Neurological: Negative for headaches and light-headedness.  Psychiatric/Behavioral: Negative for depression and substance abuse.  All other systems reviewed and are negative.   Objective:  Blood pressure (!) 152/81, pulse 75, height 5' 11"  (1.803 m), weight 282 lb (127.9 kg), SpO2 97 %. Body mass index is 39.33 kg/m.   Vitals with BMI 10/03/2019 09/14/2019 09/01/2019  Height 5' 11"  5' 11.5" -  Weight 282 lbs 239 lbs -  BMI 53.97 67.34 -  Systolic 193 790 240  Diastolic 81 81 86  Pulse 75 70 65      Physical Exam  Constitutional:  He is well-built and nearly morbidly obese in no acute distress.  HENT:  Head:  Atraumatic.  Eyes: Conjunctivae are normal.  Neck: Neck supple. No JVD present. No thyromegaly present.  Cardiovascular: Normal rate, regular rhythm, normal heart sounds and intact distal pulses. Exam reveals no gallop.  No murmur heard. No leg edema, no JVD.  Pulmonary/Chest: Effort normal and breath sounds normal.  Abdominal: Soft. Bowel sounds are normal.  Musculoskeletal: Normal range of motion.  Neurological: He is alert.  Skin: Skin is warm and dry.  Psychiatric: He has a normal mood and affect.    Radiology: No results found.  Laboratory examination:   CMP Latest Ref Rng & Units 08/30/2019 02/28/2019 02/06/2019  Glucose 70 - 99 mg/dL 254(H) 276(H) 287(H)  BUN 8 - 23 mg/dL 14 14 13   Creatinine 0.61 - 1.24 mg/dL 1.00 1.28(H) 1.14  Sodium 135 - 145 mmol/L 135 136 135  Potassium 3.5 - 5.1 mmol/L 4.1 3.8 4.0  Chloride 98 - 111 mmol/L 101 103 102  CO2 22 - 32 mmol/L 24 26 26   Calcium 8.9 - 10.3 mg/dL 9.0 9.0 9.0  Total Protein 6.5 - 8.1 g/dL - 7.5 -  Total Bilirubin 0.3 - 1.2 mg/dL - 1.2 -  Alkaline Phos 38 - 126 U/L - 82 -  AST 15 - 41 U/L - 31 -  ALT 0 - 44 U/L - 34 -   CBC Latest Ref Rng & Units 09/01/2019 08/31/2019 08/30/2019  WBC 4.0 - 10.5 K/uL 10.9(H) 11.0(H) 11.8(H)  Hemoglobin 13.0 - 17.0 g/dL 12.5(L) 13.4 13.6  Hematocrit 39.0 - 52.0 % 35.6(L) 38.7(L) 39.5  Platelets 150 - 400 K/uL 181 174 246   Lipid Panel     Component Value Date/Time   CHOL 181 08/31/2019 1045   TRIG 157 (H) 08/31/2019 1045   HDL 36 (L) 08/31/2019 1045   CHOLHDL 5.0 08/31/2019 1045   VLDL 31 08/31/2019 1045   LDLCALC 114 (H) 08/31/2019 1045   11/09/2018: Cholesterol 144, triglycerides 109, HDL 46, LDL 79.  HEMOGLOBIN A1C Lab Results  Component Value Date   HGBA1C 8.3 (H) 08/31/2019   MPG 191.51 08/31/2019   TSH Recent Labs    08/31/19 0820  TSH 0.691    Allergies  Allergen Reactions  . Shellfish Allergy Anaphylaxis    "close my throat"  . Lisinopril Cough    PRN Meds:.  Medications Discontinued During This Encounter  Medication Reason  . lisinopril (ZESTRIL) 5 MG tablet Discontinued by provider  . atorvastatin (LIPITOR) 80 MG tablet Reorder  . clopidogrel (PLAVIX) 75 MG tablet Reorder  . metoprolol succinate (TOPROL XL) 25 MG 24 hr tablet Reorder  . isosorbide mononitrate (IMDUR) 30 MG 24 hr tablet Reorder   Current Meds  Medication Sig  . albuterol (PROVENTIL HFA;VENTOLIN HFA) 108 (90 BASE) MCG/ACT inhaler Inhale 2 puffs into the lungs every 6 (six) hours as needed for wheezing.  Marland Kitchen albuterol (PROVENTIL) (2.5 MG/3ML) 0.083% nebulizer solution Take 2.5 mg by nebulization every 6 (six) hours as needed for wheezing or shortness of breath.  Marland Kitchen aspirin 81 MG EC tablet TAKE 1 TABLET BY MOUTH EVERY DAY  . Budesonide-Formoterol Fumarate (SYMBICORT IN) Inhale 2 puffs into the lungs daily.   . empagliflozin (JARDIANCE) 10 MG TABS tablet Take 10 mg by mouth daily before breakfast.  . gabapentin (NEURONTIN) 300 MG capsule Take 300 mg by mouth at bedtime.  . insulin glargine (LANTUS) 100 UNIT/ML injection Inject 68 Units into the skin at bedtime.  . isosorbide mononitrate (IMDUR) 30 MG 24 hr tablet Take 1 tablet (30 mg total) by mouth daily.  . metoprolol succinate (TOPROL-XL) 50 MG 24 hr tablet Take 1 tablet (50 mg total) by mouth daily.  . montelukast (SINGULAIR) 4 MG chewable tablet Chew 4 mg by mouth at bedtime.  . nitroGLYCERIN (NITROSTAT) 0.4 MG SL tablet Place 1 tablet (0.4 mg total) under the tongue every 5 (five) minutes as needed for chest pain.  . Vitamin D, Ergocalciferol, (DRISDOL) 1.25 MG (50000 UT) CAPS capsule Take 50,000 Units by mouth every 7 (seven) days. New Rx to start taking on Sunday  . [DISCONTINUED] isosorbide mononitrate (IMDUR) 30 MG 24 hr tablet Take 1 tablet (30 mg total) by mouth daily.  . [DISCONTINUED] lisinopril (ZESTRIL) 5 MG tablet Take 1 tablet (5 mg total) by mouth daily.  . [DISCONTINUED] metoprolol succinate (TOPROL XL) 25 MG 24 hr  tablet Take 1 tablet (25 mg total) by mouth daily.    Cardiac Studies:   Left heart catheterization 08/31/2019: Right dominant circulation, no significant disease. Left main: Normal.  LAD: Mild diffuse disease, mid segment has a 20% stenosis.  Mild calcification is evident.  Circumflex: Mild disease in the proximal and mid segment.  OM 2 which is a small to moderate-sized vessel, has a acute angle with a 90% ostial stenosis. LV: Normal LVEDP, EF 55 to 65% without wall motion abnormality.  Echocardiogram 08/21/2019: Left ventricle cavity is normal in size. Mild concentric hypertrophy of the left ventricle. Normal LV systolic function with visual EF 50-55%. Normal global wall motion. Doppler evidence of grade I (impaired) diastolic dysfunction, normal LAP. Calculated EF 50%. Trileaflet aortic valve with mild aortic valve leaflet thickening. Mild (Grade I) aortic regurgitation. The IVC is not well visualized.  Assessment:     ICD-10-CM   1. Atherosclerosis of native coronary artery of native heart with stable angina pectoris (HCC)  I25.118 EKG 12-Lead    clopidogrel (PLAVIX) 75 MG tablet    isosorbide mononitrate (IMDUR) 30 MG 24 hr tablet    metoprolol succinate (TOPROL-XL) 50 MG 24 hr tablet  2. Essential hypertension  I10 losartan (COZAAR) 25 MG tablet    CMP14+EGFR  3. Mixed hyperlipidemia  E78.2 atorvastatin (LIPITOR) 80 MG tablet    LDL cholesterol, direct    Lipid Panel With LDL/HDL Ratio    EKG 10/03/2019: Sinus rhythm 72 bpm.  Early R wave transition, otherwise normal EKG. 12/09/2018, nonspecific T wave flattening in inferolateral leads not present  Recommendations:   ARTICE BERGERSON  is a 63 y.o. AA male  with history of right DVT in April 2020 treated with Xarelto, type 2 diabetes, hypertension, presented to the hospital on 08/31/2019 with unstable angina pectoris, had high grade OM 2  disease,  recommended medical therapy.  States that since hospital discharge  He has had  constant dry cough for which she has discontinued all his medications.  He has used 2 s/l NTG since hospital discharge for angina pectoris. His blood pressure is uncontrolled today, discontinue lisinopril which is the culprit and restart all his medications back.  I have restarted his metoprolol succinate and increased it from 25 minutes to 50 mg daily in view of hypertension and also angina pectoris, discontinued lisinopril due to cough and switched him to losartan 25 mg daily.  I'll obtain lipid profile testing along with CMP in 3-4 weeks and of like to see him back then for follow-up.  He appears to be motivated in making lifestyle changes, he is already started to lose some weight and is also made changes to his diet.  Adrian Prows, MD, [facility] 10/03/2019, 10:02 PM [facility]. [place] Pager: [phone] Office: [phone] If no answer Cell [phone]

## 2019-11-01 LAB — CMP14+EGFR
ALT: 43 IU/L (ref 0–44)
AST: 39 IU/L (ref 0–40)
Albumin/Globulin Ratio: 1.5 (ref 1.2–2.2)
Albumin: 4.7 g/dL (ref 3.8–4.8)
Alkaline Phosphatase: 101 IU/L (ref 39–117)
BUN/Creatinine Ratio: 11 (ref 10–24)
BUN: 13 mg/dL (ref 8–27)
Bilirubin Total: 0.7 mg/dL (ref 0.0–1.2)
CO2: 25 mmol/L (ref 20–29)
Calcium: 9.4 mg/dL (ref 8.6–10.2)
Chloride: 99 mmol/L (ref 96–106)
Creatinine, Ser: 1.17 mg/dL (ref 0.76–1.27)
GFR calc Af Amer: 77 mL/min/{1.73_m2} (ref >59)
GFR calc non Af Amer: 67 mL/min/{1.73_m2} (ref >59)
Globulin, Total: 3.1 g/dL (ref 1.5–4.5)
Glucose: 155 mg/dL — ABNORMAL HIGH (ref 65–99)
Potassium: 3.9 mmol/L (ref 3.5–5.2)
Sodium: 140 mmol/L (ref 134–144)
Total Protein: 7.8 g/dL (ref 6.0–8.5)

## 2019-11-01 LAB — LIPID PANEL WITH LDL/HDL RATIO
Cholesterol, Total: 178 mg/dL (ref 100–199)
HDL: 37 mg/dL — ABNORMAL LOW (ref >39)
LDL Chol Calc (NIH): 98 mg/dL (ref 0–99)
LDL/HDL Ratio: 2.6 ratio (ref 0.0–3.6)
Triglycerides: 255 mg/dL — ABNORMAL HIGH (ref 0–149)
VLDL Cholesterol Cal: 43 mg/dL — ABNORMAL HIGH (ref 5–40)

## 2019-11-01 LAB — LDL CHOLESTEROL, DIRECT: LDL Direct: 115 mg/dL — ABNORMAL HIGH (ref 0–99)

## 2019-11-05 ENCOUNTER — Ambulatory Visit (INDEPENDENT_AMBULATORY_CARE_PROVIDER_SITE_OTHER): Payer: 59 | Admitting: Cardiology

## 2019-11-05 ENCOUNTER — Encounter: Payer: Self-pay | Admitting: Cardiology

## 2019-11-05 ENCOUNTER — Other Ambulatory Visit: Payer: Self-pay

## 2019-11-05 VITALS — BP 144/72 | HR 81 | Temp 96.4°F | Ht 71.0 in | Wt 243.0 lb

## 2019-11-05 DIAGNOSIS — I25118 Atherosclerotic heart disease of native coronary artery with other forms of angina pectoris: Secondary | ICD-10-CM | POA: Diagnosis not present

## 2019-11-05 DIAGNOSIS — I10 Essential (primary) hypertension: Secondary | ICD-10-CM | POA: Diagnosis not present

## 2019-11-05 DIAGNOSIS — E782 Mixed hyperlipidemia: Secondary | ICD-10-CM

## 2019-11-05 DIAGNOSIS — E119 Type 2 diabetes mellitus without complications: Secondary | ICD-10-CM

## 2019-11-05 DIAGNOSIS — Z794 Long term (current) use of insulin: Secondary | ICD-10-CM

## 2019-11-05 DIAGNOSIS — G4733 Obstructive sleep apnea (adult) (pediatric): Secondary | ICD-10-CM

## 2019-11-05 MED ORDER — LOSARTAN POTASSIUM-HCTZ 100-25 MG PO TABS: 1.0000 | ORAL_TABLET | Freq: Every day | ORAL | 2 refills | Status: DC

## 2019-11-05 MED ORDER — METFORMIN HCL 500 MG PO TABS: 500.0000 mg | ORAL_TABLET | Freq: Two times a day (BID) | ORAL | 0 refills | Status: DC

## 2019-11-05 MED ORDER — EZETIMIBE 10 MG PO TABS: 10.0000 mg | ORAL_TABLET | Freq: Every day | ORAL | 2 refills | Status: DC

## 2019-11-05 NOTE — Progress Notes (Signed)
Primary Physician:  Lin Landsman, MD   Patient ID: Eric Cohen, male    DOB: [birth year], 64 y.o.   MRN: [id number]  Subjective:    Chief Complaint  Patient presents with  . Coronary Artery Disease  . Hypertension  . Follow-up    4wk    HPI: Eric Cohen  is a 64 y.o.  AA male  with history of right DVT in April 2020 treated with Xarelto, type 2 diabetes, hypertension, evaluated by [place] in the outpatient basis was scheduled for a stress test, presented to the hospital on 08/31/2019 with unstable angina pectoris for which he underwent cardiac catheterization and was found to have single-vessel coronary disease and  recommended medical therapy. He now presents to the office for follow-up.  He has chronic dyspnea from asthma, still has occasional angina and has used 3 S/L NTG. .   Past Medical History:  Diagnosis Date  . Asthma   . Diabetes mellitus without complication ([place])   . Diverticulitis   . DVT (deep venous thrombosis) ([place])   . Hypertension     Past Surgical History:  Procedure Laterality Date  . APPENDECTOMY    . CHOLECYSTECTOMY    . COLOSTOMY REVERSAL    . KNEE SURGERY    . LEFT HEART CATH AND CORONARY ANGIOGRAPHY N/A 08/31/2019   Procedure: LEFT HEART CATH AND CORONARY ANGIOGRAPHY;  Surgeon: Adrian Prows, MD;  Location: [facility];  Service: Cardiovascular;  Laterality: N/A;    Social History   Socioeconomic History  . Marital status: Married    Spouse name: Not on file  . Number of children: 1  . Years of education: Not on file  . Highest education level: Not on file  Occupational History  . Not on file  Tobacco Use  . Smoking status: Never Smoker  . Smokeless tobacco: Never Used  Substance and Sexual Activity  . Alcohol use: No  . Drug use: No  . Sexual activity: Not on file  Other Topics Concern  . Not on file  Social History Narrative  . Not on file   Social Determinants of Health   Financial Resource Strain:   . Difficulty of  Paying Living Expenses: Not on file  Food Insecurity:   . Worried About Charity fundraiser in the Last Year: Not on file  . Ran Out of Food in the Last Year: Not on file  Transportation Needs:   . Lack of Transportation (Medical): Not on file  . Lack of Transportation (Non-Medical): Not on file  Physical Activity:   . Days of Exercise per Week: Not on file  . Minutes of Exercise per Session: Not on file  Stress:   . Feeling of Stress : Not on file  Social Connections:   . Frequency of Communication with Friends and Family: Not on file  . Frequency of Social Gatherings with Friends and Family: Not on file  . Attends Religious Services: Not on file  . Active Member of Clubs or Organizations: Not on file  . Attends Archivist Meetings: Not on file  . Marital Status: Not on file  Intimate Partner Violence:   . Fear of Current or Ex-Partner: Not on file  . Emotionally Abused: Not on file  . Physically Abused: Not on file  . Sexually Abused: Not on file   Review of Systems  Constitution: Positive for malaise/fatigue. Negative for weight gain.  Cardiovascular: Positive for chest pain. Negative for dyspnea on  exertion, leg swelling and syncope.  Endocrine: Negative for cold intolerance.  Hematologic/Lymphatic: Does not bruise/bleed easily.  Musculoskeletal: Negative for joint swelling.  Gastrointestinal: Negative for change in bowel habit, hematochezia and melena.  Neurological: Negative for headaches and light-headedness.  All other systems reviewed and are negative.   Objective:  Blood pressure (!) 144/72, pulse 81, temperature (!) 96.4 F (35.8 C), height 5' 11"  (1.803 m), weight 243 lb (110.2 kg), SpO2 97 %. Body mass index is 33.89 kg/m.   Vitals with BMI 11/05/2019 10/03/2019 09/14/2019  Height 5' 11"  5' 11"  5' 11.5"  Weight 243 lbs 282 lbs 239 lbs  BMI 33.91 38.93 73.42  Systolic 876 811 572  Diastolic 72 81 81  Pulse 81 75 70      Physical Exam   Constitutional:  He is well-built and nearly morbidly obese in no acute distress.  HENT:  Head: Atraumatic.  Eyes: Conjunctivae are normal.  Neck: No JVD present. No thyromegaly present.  Cardiovascular: Normal rate, regular rhythm, normal heart sounds and intact distal pulses. Exam reveals no gallop.  No murmur heard. No leg edema, no JVD.  Pulmonary/Chest: Effort normal and breath sounds normal.  Abdominal: Soft. Bowel sounds are normal.  Musculoskeletal:        General: Normal range of motion.     Cervical back: Neck supple.  Neurological: He is alert.  Skin: Skin is warm and dry.  Psychiatric: He has a normal mood and affect.    Radiology: No results found.  Laboratory examination:   CMP Latest Ref Rng & Units 10/31/2019 08/30/2019 02/28/2019  Glucose 65 - 99 mg/dL 155(H) 254(H) 276(H)  BUN 8 - 27 mg/dL 13 14 14   Creatinine 0.76 - 1.27 mg/dL 1.17 1.00 1.28(H)  Sodium 134 - 144 mmol/L 140 135 136  Potassium 3.5 - 5.2 mmol/L 3.9 4.1 3.8  Chloride 96 - 106 mmol/L 99 101 103  CO2 20 - 29 mmol/L 25 24 26   Calcium 8.6 - 10.2 mg/dL 9.4 9.0 9.0  Total Protein 6.0 - 8.5 g/dL 7.8 - 7.5  Total Bilirubin 0.0 - 1.2 mg/dL 0.7 - 1.2  Alkaline Phos 39 - 117 IU/L 101 - 82  AST 0 - 40 IU/L 39 - 31  ALT 0 - 44 IU/L 43 - 34   CBC Latest Ref Rng & Units 09/01/2019 08/31/2019 08/30/2019  WBC 4.0 - 10.5 K/uL 10.9(H) 11.0(H) 11.8(H)  Hemoglobin 13.0 - 17.0 g/dL 12.5(L) 13.4 13.6  Hematocrit 39.0 - 52.0 % 35.6(L) 38.7(L) 39.5  Platelets 150 - 400 K/uL 181 174 246   Lipid Panel     Component Value Date/Time   CHOL 178 10/31/2019 1605   TRIG 255 (H) 10/31/2019 1605   HDL 37 (L) 10/31/2019 1605   CHOLHDL 5.0 08/31/2019 1045   VLDL 31 08/31/2019 1045   LDLCALC 98 10/31/2019 1605   LDLDIRECT 115 (H) 10/31/2019 1605   11/09/2018: Cholesterol 144, triglycerides 109, HDL 46, LDL 79.  HEMOGLOBIN A1C Lab Results  Component Value Date   HGBA1C 8.3 (H) 08/31/2019   MPG 191.51 08/31/2019    TSH Recent Labs    08/31/19 0820  TSH 0.691    Allergies  Allergen Reactions  . Shellfish Allergy Anaphylaxis    "close my throat"  . Lisinopril Cough    PRN Meds:. Medications Discontinued During This Encounter  Medication Reason  . losartan (COZAAR) 25 MG tablet Change in therapy   Current Meds  Medication Sig  . albuterol (PROVENTIL HFA;VENTOLIN HFA) 108 (  90 BASE) MCG/ACT inhaler Inhale 2 puffs into the lungs every 6 (six) hours as needed for wheezing.  Marland Kitchen albuterol (PROVENTIL) (2.5 MG/3ML) 0.083% nebulizer solution Take 2.5 mg by nebulization every 6 (six) hours as needed for wheezing or shortness of breath.  Marland Kitchen aspirin 81 MG EC tablet TAKE 1 TABLET BY MOUTH EVERY DAY  . atorvastatin (LIPITOR) 80 MG tablet Take 1 tablet (80 mg total) by mouth daily at 6 PM.  . Budesonide-Formoterol Fumarate (SYMBICORT IN) Inhale 2 puffs into the lungs daily.   . clopidogrel (PLAVIX) 75 MG tablet Take 1 tablet (75 mg total) by mouth daily.  . empagliflozin (JARDIANCE) 10 MG TABS tablet Take 10 mg by mouth daily before breakfast.  . gabapentin (NEURONTIN) 300 MG capsule Take 300 mg by mouth at bedtime.  . insulin glargine (LANTUS) 100 UNIT/ML injection Inject 68 Units into the skin at bedtime.  . isosorbide mononitrate (IMDUR) 30 MG 24 hr tablet Take 1 tablet (30 mg total) by mouth daily.  . metoprolol succinate (TOPROL-XL) 50 MG 24 hr tablet Take 1 tablet (50 mg total) by mouth daily.  . montelukast (SINGULAIR) 4 MG chewable tablet Chew 4 mg by mouth at bedtime.  . nitroGLYCERIN (NITROSTAT) 0.4 MG SL tablet Place 1 tablet (0.4 mg total) under the tongue every 5 (five) minutes as needed for chest pain.  . Vitamin D, Ergocalciferol, (DRISDOL) 1.25 MG (50000 UT) CAPS capsule Take 50,000 Units by mouth every 7 (seven) days. New Rx to start taking on Sunday  . [DISCONTINUED] losartan (COZAAR) 25 MG tablet Take 1 tablet (25 mg total) by mouth daily.    Cardiac Studies:   Left heart  catheterization 08/31/2019: Right dominant circulation, no significant disease. Left main: Normal.  LAD: Mild diffuse disease, mid segment has a 20% stenosis.  Mild calcification is evident.  Circumflex: Mild disease in the proximal and mid segment.  OM 2 which is a small to moderate-sized vessel, has a acute angle with a 90% ostial stenosis. LV: Normal LVEDP, EF 55 to 65% without wall motion abnormality.  Echocardiogram 08/21/2019: Left ventricle cavity is normal in size. Mild concentric hypertrophy of the left ventricle. Normal LV systolic function with visual EF 50-55%. Normal global wall motion. Doppler evidence of grade I (impaired) diastolic dysfunction, normal LAP. Calculated EF 50%. Trileaflet aortic valve with mild aortic valve leaflet thickening. Mild (Grade I) aortic regurgitation. The IVC is not well visualized.  Assessment:     ICD-10-CM   1. Atherosclerosis of native coronary artery of native heart with stable angina pectoris ([place])  I25.118   2. Essential hypertension  I10 losartan-hydrochlorothiazide (HYZAAR) 100-25 MG tablet    DPO24+MPNT    Basic Metabolic Panel (BMET)  3. Mixed hyperlipidemia  E78.2 ezetimibe (ZETIA) 10 MG tablet    Lipid Panel With LDL/HDL Ratio  4. Type 2 diabetes mellitus without complication, with long-term current use of insulin (HCC)  E11.9 metFORMIN (GLUCOPHAGE) 500 MG tablet   Z79.4   5. Obstructive sleep apnea syndrome  G47.33 Ambulatory referral to Sleep Studies    EKG 10/03/2019: Sinus rhythm 72 bpm.  Early R wave transition, otherwise normal EKG. 12/09/2018, nonspecific T wave flattening in inferolateral leads not present  Recommendations:   Eric Cohen  is a 64 y.o. AA male  with history of right DVT in April 2020 treated with Xarelto, type 2 diabetes, hypertension, presented to the hospital on 08/31/2019 with unstable angina pectoris, had high grade OM 2 disease, recommended medical therapy.  C/O  fatigue since last OV and sleepy all th  time.  I suspect it is due sleep apnea. He has uncontrolled DM and also Hypertension. Tolerating losartan 25 mg daily, change to Losartan HCT due to hypertension. Refer to sleep study. Needs better DM control.  Start Metformin and asked him to make appointment with Dr. Ayesha Rumpf.   Continues to have fairly stable angina pectoris, lipids are not well controlled, will add Zetia 10 mg daily. Weight loss stressed again and gave positive reinforcement. [facility] diet (low glycemic) sheet given to the patient.  Discussed regarding DASH diet and salt restriction.  OV in 3 months.  Labs  In 2 months and again BMP only in 2 weeks due to Losartan increase  Adrian Prows, MD, [facility] 11/05/2019, 11:28 AM [facility]. [place] Pager: [phone] Office: [phone] If no answer Cell [phone]

## 2019-11-05 NOTE — Patient Instructions (Addendum)
  CMP Latest Ref Rng & Units 10/31/2019 08/30/2019 02/28/2019  Glucose 65 - 99 mg/dL 546(E) 703(J) 009(F)  BUN 8 - 27 mg/dL 13 14 14   Creatinine 0.76 - 1.27 mg/dL 8.18 2.99)  Sodium 134 - 144 mmol/L 140 135 136  Potassium 3.5 - 5.2 mmol/L 3.9 4.1 3.8  Chloride 96 - 106 mmol/L 99 101 103  CO2 20 - 29 mmol/L 25 24 26   Calcium 8.6 - 10.2 mg/dL 9.4 9.0 9.0  Total Protein 6.0 - 8.5 g/dL 7.8 - 7.5  Total Bilirubin 0.0 - 1.2 mg/dL 0.7 - 1.2  Alkaline Phos 39 - 117 IU/L 101 - 82  AST 0 - 40 IU/L 39 - 31  ALT 0 - 44 IU/L 43 - 34   CBC Latest Ref Rng & Units 09/01/2019 08/31/2019 08/30/2019  WBC 4.0 - 10.5 K/uL 10.9(H) 11.0(H) 11.8(H)  Hemoglobin 13.0 - 17.0 g/dL 12.5(L) 13.4 13.6  Hematocrit 39.0 - 52.0 % 35.6(L) 38.7(L) 39.5  Platelets 150 - 400 K/uL 181 174 246   Lipid Panel     Component Value Date/Time   CHOL 178 10/31/2019 1605   TRIG 255 (H) 10/31/2019 1605   HDL 37 (L) 10/31/2019 1605   CHOLHDL 5.0 08/31/2019 1045   VLDL 31 08/31/2019 1045   LDLCALC 98 10/31/2019 1605   LDLDIRECT 115 (H) 10/31/2019 1605   HEMOGLOBIN A1C Lab Results  Component Value Date   HGBA1C 8.3 (H) 08/31/2019   MPG 191.51 08/31/2019   TSH Recent Labs    08/31/19 0820  TSH 0.691    08/31/2019 Hgb A1c MFr Bld 4.8 - 5.6 % 8.3High     ########################################  GET Blood work in 2 weeks at 13/06/20.  Also blood work on 2nd week of March 2021  ################################

## 2019-11-13 ENCOUNTER — Institutional Professional Consult (permissible substitution): Payer: Self-pay | Admitting: Neurology

## 2019-11-13 ENCOUNTER — Encounter: Payer: Self-pay | Admitting: Neurology

## 2019-11-14 ENCOUNTER — Other Ambulatory Visit: Payer: Self-pay

## 2019-11-14 ENCOUNTER — Encounter: Payer: Self-pay | Admitting: Neurology

## 2019-11-14 ENCOUNTER — Ambulatory Visit: Payer: 59 | Admitting: Neurology

## 2019-11-14 VITALS — BP 140/82 | HR 90 | Temp 97.0°F | Ht 71.0 in | Wt 237.3 lb

## 2019-11-14 DIAGNOSIS — R0683 Snoring: Secondary | ICD-10-CM | POA: Diagnosis not present

## 2019-11-14 DIAGNOSIS — I2510 Atherosclerotic heart disease of native coronary artery without angina pectoris: Secondary | ICD-10-CM

## 2019-11-14 DIAGNOSIS — R519 Headache, unspecified: Secondary | ICD-10-CM

## 2019-11-14 DIAGNOSIS — F39 Unspecified mood [affective] disorder: Secondary | ICD-10-CM

## 2019-11-14 DIAGNOSIS — E669 Obesity, unspecified: Secondary | ICD-10-CM

## 2019-11-14 DIAGNOSIS — G4719 Other hypersomnia: Secondary | ICD-10-CM

## 2019-11-14 DIAGNOSIS — I25118 Atherosclerotic heart disease of native coronary artery with other forms of angina pectoris: Secondary | ICD-10-CM

## 2019-11-14 DIAGNOSIS — I200 Unstable angina: Secondary | ICD-10-CM

## 2019-11-14 DIAGNOSIS — R0681 Apnea, not elsewhere classified: Secondary | ICD-10-CM

## 2019-11-14 DIAGNOSIS — F515 Nightmare disorder: Secondary | ICD-10-CM

## 2019-11-14 DIAGNOSIS — R351 Nocturia: Secondary | ICD-10-CM

## 2019-11-14 MED ORDER — NITROGLYCERIN 0.4 MG SL SUBL: 0.4000 mg | SUBLINGUAL_TABLET | SUBLINGUAL | 3 refills | Status: DC | PRN

## 2019-11-14 NOTE — Progress Notes (Signed)
Subjective:    Patient ID: Eric Cohen is a 64 y.o. male.  HPI     Eric Foley, MD, PhD [facility] [address] [address] [place], [place] [address]  Dear Vonna Kotyk, I saw your patient, Eric Cohen, upon your kind request in my sleep clinic today for initial consultation of his sleep disorder, in particular, concern for underlying obstructive sleep apnea.  The patient is unaccompanied today.  He missed an appointment on 11/13/2019. As you know, Eric Cohen is a 64 year old right-handed gentleman with an underlying medical history of hypertension, unstable angina with coronary artery disease, history of DVT, diabetes, diverticulitis, asthma, mood disorder, and obesity, who reports snoring and excessive daytime somnolence.  I reviewed your office note from 11/05/2019.  His Epworth sleepiness score is 18 out of 24, fatigue severity score is 48 out of 63.  He is married and lives with his wife, they have 1 child, he does not smoke or drink alcohol or drink caffeine on a daily basis.  He is followed by the [facility] for his mood disorder and nightmares.  He is on several potentially sedating medications including carbamazepine 200 mg strength 6 pills at bedtime, prazosin 5 mg at bedtime, doxazosin 75 mg at bedtime, gabapentin 300 mg strength 1 pill 3 times daily and 2 pills at bedtime.  He reports witnessed apneas per wife's feedback, his wife has sleep apnea and uses a CPAP machine.  He has a family history of heart disease but is not aware of any family history of OSA.  He has woken up with a headache, sometimes as often as once a week, he does go to the bathroom at night, twice per average night.  He watches TV in bed and has a TV on a sleep timer.  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  Bedtime is generally between 730 and 8 PM and rise time between 330 and 4.  They have no pets in the home.  He has right knee pain.  He had several  surgeries to the right knee.  His Past Medical History Is Significant For: Past Medical History:  Diagnosis Date  . Asthma   . Depression   . Diabetes mellitus without complication (HCC)   . Diverticulitis   . DVT (deep venous thrombosis) (HCC)   . Heart disease   . Hypertension     His Past Surgical History Is Significant For: Past Surgical History:  Procedure Laterality Date  . APPENDECTOMY    . CHOLECYSTECTOMY    . COLOSTOMY REVERSAL    . KNEE SURGERY    . LEFT HEART CATH AND CORONARY ANGIOGRAPHY N/A 08/31/2019   Procedure: LEFT HEART CATH AND CORONARY ANGIOGRAPHY;  Surgeon: Yates Decamp, MD;  Location: [facility] INVASIVE CV LAB;  Service: Cardiovascular;  Laterality: N/A;    His Family History Is Significant For: Family History  Problem Relation Age of Onset  . Heart failure Mother   . Diabetes Father   . Hyperlipidemia Father     His Social History Is Significant For: Social History   Socioeconomic History  . Marital status: Married    Spouse name: Not on file  . Number of children: 1  . Years of education: Not on file  . Highest education level: Not on file  Occupational History  . Not on file  Tobacco Use  . Smoking status: Never Smoker  . Smokeless tobacco: Never Used  Substance and  Sexual Activity  . Alcohol use: No  . Drug use: No  . Sexual activity: Not on file  Other Topics Concern  . Not on file  Social History Narrative  . Not on file   Social Determinants of Health   Financial Resource Strain:   . Difficulty of Paying Living Expenses: Not on file  Food Insecurity:   . Worried About Programme researcher, broadcasting/film/video in the Last Year: Not on file  . Ran Out of Food in the Last Year: Not on file  Transportation Needs:   . Lack of Transportation (Medical): Not on file  . Lack of Transportation (Non-Medical): Not on file  Physical Activity:   . Days of Exercise per Week: Not on file  . Minutes of Exercise per Session: Not on file  Stress:   . Feeling of Stress  : Not on file  Social Connections:   . Frequency of Communication with Friends and Family: Not on file  . Frequency of Social Gatherings with Friends and Family: Not on file  . Attends Religious Services: Not on file  . Active Member of Clubs or Organizations: Not on file  . Attends Banker Meetings: Not on file  . Marital Status: Not on file    His Allergies Are:  Allergies  Allergen Reactions  . Shellfish Allergy Anaphylaxis    "close my throat"  . Lisinopril Cough  :   His Current Medications Are:  Outpatient Encounter Medications as of 11/14/2019  Medication Sig  . albuterol (PROVENTIL HFA;VENTOLIN HFA) 108 (90 BASE) MCG/ACT inhaler Inhale 2 puffs into the lungs every 6 (six) hours as needed for wheezing.  Marland Kitchen albuterol (PROVENTIL) (2.5 MG/3ML) 0.083% nebulizer solution Take 2.5 mg by nebulization every 6 (six) hours as needed for wheezing or shortness of breath.  Marland Kitchen aspirin 81 MG EC tablet TAKE 1 TABLET BY MOUTH EVERY DAY  . atorvastatin (LIPITOR) 80 MG tablet Take 1 tablet (80 mg total) by mouth daily at 6 PM.  . Budesonide-Formoterol Fumarate (SYMBICORT IN) Inhale 2 puffs into the lungs daily.   . carbamazepine (TEGRETOL) 200 MG tablet Take 200 mg by mouth 3 (three) times daily.  . clopidogrel (PLAVIX) 75 MG tablet Take 1 tablet (75 mg total) by mouth daily.  Marland Kitchen doxepin (SINEQUAN) 75 MG capsule Take 75 mg by mouth.  . empagliflozin (JARDIANCE) 10 MG TABS tablet Take 10 mg by mouth daily before breakfast.  . ezetimibe (ZETIA) 10 MG tablet Take 1 tablet (10 mg total) by mouth daily.  . fluticasone (FLONASE) 50 MCG/ACT nasal spray Place into both nostrils daily.  Marland Kitchen gabapentin (NEURONTIN) 300 MG capsule Take 300 mg by mouth at bedtime.  . hydrochlorothiazide (HYDRODIURIL) 25 MG tablet Take 25 mg by mouth daily.  . insulin glargine (LANTUS) 100 UNIT/ML injection Inject 68 Units into the skin at bedtime.  Marland Kitchen losartan-hydrochlorothiazide (HYZAAR) 100-25 MG tablet Take 1  tablet by mouth daily.  . metFORMIN (GLUCOPHAGE) 500 MG tablet Take 1 tablet (500 mg total) by mouth 2 (two) times daily with a meal.  . montelukast (SINGULAIR) 4 MG chewable tablet Chew 4 mg by mouth at bedtime.  . nitroGLYCERIN (NITROSTAT) 0.4 MG SL tablet Place 1 tablet (0.4 mg total) under the tongue every 5 (five) minutes as needed for chest pain.  . prazosin (MINIPRESS) 5 MG capsule Take 5 mg by mouth at bedtime.  . Vitamin D, Ergocalciferol, (DRISDOL) 1.25 MG (50000 UT) CAPS capsule Take 50,000 Units by mouth every 7 (seven)  days. New Rx to start taking on Sunday  . isosorbide mononitrate (IMDUR) 30 MG 24 hr tablet Take 1 tablet (30 mg total) by mouth daily.  . [DISCONTINUED] metoprolol succinate (TOPROL-XL) 50 MG 24 hr tablet Take 1 tablet (50 mg total) by mouth daily.   No facility-administered encounter medications on file as of 11/14/2019.  :  Review of Systems:  Out of a complete 14 point review of systems, all are reviewed and negative with the exception of these symptoms as listed below: Review of Systems  Neurological:       Sleep consult- No prior sleep study. Sts he does snore and sometimes he feels sleepy during the day. Epworth Sleepiness Scale 0= would never doze 1= slight chance of dozing 2= moderate chance of dozing 3= high chance of dozing  Sitting and reading:2 Watching TV:3 Sitting inactive in a public place (ex. Theater or meeting):2 As a passenger in a car for an hour without a break:3 Lying down to rest in the afternoon:2 Sitting and talking to someone:2 Sitting quietly after lunch (no alcohol):3 In a car, while stopped in traffic:1 Total:18     Objective:  Neurological Exam  Physical Exam Physical Examination:   Vitals:   11/14/19 0851  BP: 140/82  Pulse: 90  Temp: (!) 97 F (36.1 C)    General Examination: The patient is a very pleasant 64 y.o. male in no acute distress. He appears well-developed and well-nourished and well groomed.    HEENT: Normocephalic, atraumatic, pupils are equal, round and reactive to light, extraocular tracking is good without limitation to gaze excursion or nystagmus noted. Hearing is grossly intact. Face is symmetric with normal facial animation. Speech is clear with no dysarthria noted. There is no hypophonia. There is no lip, neck/head, jaw or voice tremor. Neck is supple with full range of passive and active motion. There are no carotid bruits on auscultation. Oropharynx exam reveals: moderate mouth dryness, adequate dental hygiene With several missing teeth, he has a partial plate for the top but did not put it in today, all front teeth are missing on the top, moderate airway crowding, tonsils are absent but uvula enlarged and longer, Mallampati class II, neck circumference of 18-1/4 inches.   Chest: Clear to auscultation without wheezing, rhonchi or crackles noted.  Heart: S1+S2+0, regular and normal without murmurs, rubs or gallops noted.   Abdomen: Soft, non-tender and non-distended with normal bowel sounds appreciated on auscultation. Unremarkable midline abdominal scar and Left lower abdominal scar from prior surgeries.  Extremities: There is no pitting edema in the distal lower extremities bilaterally.   Skin: Warm and dry without trophic changes noted.   Musculoskeletal: exam reveals Right knee discomfort.  Neurologically:  Mental status: The patient is awake, alert and oriented in all 4 spheres. His immediate and remote memory, attention, language skills and fund of knowledge are appropriate. There is no evidence of aphasia, agnosia, apraxia or anomia. Speech is clear with normal prosody and enunciation. Thought process is linear. Mood is normal and affect is normal.  Cranial nerves II - XII are as described above under HEENT exam.  Motor exam: Normal bulk, strength and tone is noted. There is no tremor. Fine motor skills and coordination: grossly intact.  Cerebellar testing: No  dysmetria or intention tremor. There is no truncal or gait ataxia.  Sensory exam: intact to light touch in the upper and lower extremities.  Gait, station and balance: He stands up with mild difficulty and stands slightly wide-based,  he walks with a mild limp on the right, no walking aid.                Assessment and Plan:  In summary, Eric Cohen is a very pleasant 64 y.o.-year old male  with an underlying medical history of hypertension, unstable angina with coronary artery disease, history of DVT, diabetes, diverticulitis, asthma, mood disorder, and obesity, whose history and physical exam are concerning for obstructive sleep apnea (OSA). I had a long chat with the patient about my findings and the diagnosis of OSA, its prognosis and treatment options. We talked about medical treatments, surgical interventions and non-pharmacological approaches. I explained in particular the risks and ramifications of untreated moderate to severe OSA, especially with respect to developing cardiovascular disease down the Road, including congestive heart failure, difficult to treat hypertension, cardiac arrhythmias, or stroke. Even type 2 diabetes has, in part, been linked to untreated OSA. Symptoms of untreated OSA include daytime sleepiness, memory problems, mood irritability and mood disorder such as depression and anxiety, lack of energy, as well as recurrent headaches, especially morning headaches. We talked about trying to maintain a healthy lifestyle in general, as well as the importance of weight control. We also talked about the importance of good sleep hygiene. I recommended the following at this time: sleep study.  His daytime somnolence may at least be in part secondary to taking several potentially sedating medications.  I advised him of this.  I explained the sleep test procedure to the patient and also outlined possible surgical and non-surgical treatment options of OSA, including the use of a  custom-made dental device (which would require a referral to a specialist dentist or oral surgeon), upper airway surgical options, such as traditional UPPP or a novel less invasive surgical option in the form of Inspire hypoglossal nerve stimulation (which would involve a referral to an ENT surgeon). I also explained the CPAP treatment option to the patient, who indicated that he would be willing to try CPAP if the need arises. I explained the importance of being compliant with PAP treatment, not only for insurance purposes but primarily to improve His symptoms, and for the patient's long term health benefit, including to reduce His cardiovascular risks. I answered all his questions today and the patient was in agreement. I plan to see him back after the sleep study is completed and encouraged him to call with any interim questions, concerns, problems or updates.   Thank you very much for allowing me to participate in the care of this nice patient. If I can be of any further assistance to you please do not hesitate to call me at [phone].  Sincerely,   Star Age, MD, PhD

## 2019-11-14 NOTE — Patient Instructions (Signed)

## 2019-11-15 ENCOUNTER — Other Ambulatory Visit: Payer: Self-pay

## 2019-11-15 MED ORDER — JARDIANCE 10 MG PO TABS: 10.0000 mg | ORAL_TABLET | Freq: Every day | ORAL | 1 refills | Status: DC

## 2019-11-23 ENCOUNTER — Telehealth: Payer: Self-pay

## 2019-11-23 LAB — BASIC METABOLIC PANEL
BUN/Creatinine Ratio: 21 (ref 10–24)
BUN: 26 mg/dL (ref 8–27)
CO2: 26 mmol/L (ref 20–29)
Calcium: 9.7 mg/dL (ref 8.6–10.2)
Chloride: 98 mmol/L (ref 96–106)
Creatinine, Ser: 1.26 mg/dL (ref 0.76–1.27)
GFR calc Af Amer: 70 mL/min/{1.73_m2} (ref >59)
GFR calc non Af Amer: 61 mL/min/{1.73_m2} (ref >59)
Glucose: 121 mg/dL — ABNORMAL HIGH (ref 65–99)
Potassium: 3.7 mmol/L (ref 3.5–5.2)
Sodium: 142 mmol/L (ref 134–144)

## 2019-11-23 NOTE — Telephone Encounter (Signed)
-----   Message from Yates Decamp, MD sent at 11/23/2019  6:45 AM EST ----- Blood sugar mildly elevated as in baseline and normal renal function and K levels

## 2019-11-23 NOTE — Progress Notes (Signed)
Blood sugar mildly elevated as in baseline and normal renal function and K levels

## 2019-11-23 NOTE — Progress Notes (Signed)
Called pt wife answered inform her about pt lab results. Wife understood.

## 2019-11-23 NOTE — Telephone Encounter (Signed)
LMOVM concerning results. 

## 2019-11-24 ENCOUNTER — Other Ambulatory Visit: Payer: Self-pay | Admitting: Cardiology

## 2019-11-27 ENCOUNTER — Other Ambulatory Visit: Payer: Self-pay | Admitting: Cardiology

## 2019-11-27 DIAGNOSIS — E119 Type 2 diabetes mellitus without complications: Secondary | ICD-10-CM

## 2019-11-27 DIAGNOSIS — Z794 Long term (current) use of insulin: Secondary | ICD-10-CM

## 2019-12-04 ENCOUNTER — Ambulatory Visit (INDEPENDENT_AMBULATORY_CARE_PROVIDER_SITE_OTHER): Payer: 59 | Admitting: Neurology

## 2019-12-04 DIAGNOSIS — F39 Unspecified mood [affective] disorder: Secondary | ICD-10-CM

## 2019-12-04 DIAGNOSIS — R0683 Snoring: Secondary | ICD-10-CM

## 2019-12-04 DIAGNOSIS — R519 Headache, unspecified: Secondary | ICD-10-CM

## 2019-12-04 DIAGNOSIS — R0681 Apnea, not elsewhere classified: Secondary | ICD-10-CM

## 2019-12-04 DIAGNOSIS — G4720 Circadian rhythm sleep disorder, unspecified type: Secondary | ICD-10-CM

## 2019-12-04 DIAGNOSIS — R351 Nocturia: Secondary | ICD-10-CM

## 2019-12-04 DIAGNOSIS — E669 Obesity, unspecified: Secondary | ICD-10-CM

## 2019-12-04 DIAGNOSIS — F515 Nightmare disorder: Secondary | ICD-10-CM

## 2019-12-04 DIAGNOSIS — G4719 Other hypersomnia: Secondary | ICD-10-CM

## 2019-12-04 DIAGNOSIS — I200 Unstable angina: Secondary | ICD-10-CM

## 2019-12-04 DIAGNOSIS — G4733 Obstructive sleep apnea (adult) (pediatric): Secondary | ICD-10-CM

## 2019-12-04 DIAGNOSIS — I2510 Atherosclerotic heart disease of native coronary artery without angina pectoris: Secondary | ICD-10-CM

## 2019-12-17 ENCOUNTER — Ambulatory Visit: Payer: 59 | Admitting: Cardiology

## 2019-12-17 NOTE — Addendum Note (Signed)
Addended by: [name] on: 12/17/2019 06:11 PM   Modules accepted: Orders

## 2019-12-17 NOTE — Progress Notes (Signed)
Patient referred by Dr. Jacinto Halim, seen by me on 11/14/19, diagnostic PSG on 12/04/19.   Please call and notify the patient that the recent sleep study showed mild to moderate obstructive sleep apnea. He did not sleep very well, had nearly no dream sleep and sleep apnea tends to be worse during REM/dream sleep. I recommend treatment in the form of CPAP. This will require a repeat sleep study for proper titration and mask fitting and correct monitoring of the oxygen saturations. Please explain to patient. I have placed an order in the chart. Thanks.  [name], MD, PhD [facility])

## 2019-12-17 NOTE — Procedures (Signed)
PATIENT'S NAME:  Eric Cohen, Eric Cohen DOB:      1956-07-31      MR#:    [id number]     DATE OF RECORDING: 12/04/2019 REFERRING M.D.:  Adrian Prows MD Study Performed:   Baseline Polysomnogram HISTORY: 64 year old man with a history of hypertension, unstable angina with coronary artery disease, history of DVT, diabetes, diverticulitis, asthma, mood disorder, and obesity, who reports snoring and excessive daytime somnolence. The patient endorsed the Epworth Sleepiness Scale at 18/24 points. The patient's weight 231 pounds with a height of 71 (inches), resulting in a BMI of 32.4 kg/m2. The patient's neck circumference measured 18.2 inches.  CURRENT MEDICATIONS: Proventil, Aspirin, Lipitor, Symbicort, Tegretol, Plavix, Sinequan, Jardiance, Zetia, Flonase, Neurontin, Hydrodiuril, Lantus, Hyzaar, Glucophage, Singulair, Nitorstat, Minipress, Disdol, Imdur.   PROCEDURE:  This is a multichannel digital polysomnogram utilizing the Somnostar 11.2 system.  Electrodes and sensors were applied and monitored per AASM Specifications.   EEG, EOG, Chin and Limb EMG, were sampled at 200 Hz.  ECG, Snore and Nasal Pressure, Thermal Airflow, Respiratory Effort, CPAP Flow and Pressure, Oximetry was sampled at 50 Hz. Digital video and audio were recorded.      BASELINE STUDY  Lights Out was at 20:33 and Lights On at 04:50.  Total recording time (TRT) was 497 minutes, with a total sleep time (TST) of 423.5 minutes.   The patient's sleep latency was 1.5 minutes. REM latency was 236 minutes, which is markedly delayed. The sleep efficiency was 85.2 %.     SLEEP ARCHITECTURE: WASO (Wake after sleep onset) was 71.5 minutes with sleep fragmentation noted, ranging from mild to severe.  There were 22.5 minutes in Stage N1, 367.5 minutes Stage N2, 32 minutes Stage N3 and 1.5 minutes in Stage REM.  The percentage of Stage N1 was 5.3%, Stage N2 was 86.8%, which is markedly increased, Stage N3 was 7.6% and Stage R (REM sleep) was near-absent at  .4%.  The arousals were noted as: 151 were spontaneous, 10 were associated with PLMs, 34 were associated with respiratory events.  RESPIRATORY ANALYSIS:  There were a total of 84 respiratory events:  0 obstructive apneas, 0 central apneas and 0 mixed apneas with a total of 0 apneas and an apnea index (AI) of 0 /hour. There were 84 hypopneas with a hypopnea index of 11.9 /hour. The patient also had 0 respiratory event related arousals (RERAs).      The total APNEA/HYPOPNEA INDEX (AHI) was 11.9/hour and the total RESPIRATORY DISTURBANCE INDEX was  11.9 /hour.  1 events occurred in REM sleep and 166 events in NREM. The REM AHI was  40 /hour, versus a non-REM AHI of 11.8. The patient spent 104.5 minutes of total sleep time in the supine position and 319 minutes in non-supine.. The supine AHI was 17.2 versus a non-supine AHI of 10.2.  OXYGEN SATURATION & C02:  The Wake baseline 02 saturation was 94%, with the lowest being 86%. Time spent below 89% saturation equaled 2 minutes.  PERIODIC LIMB MOVEMENTS: The patient had a total of 13 Periodic Limb Movements.  The Periodic Limb Movement (PLM) index was 1.8 and the PLM Arousal index was 1.4/hour.  Audio and video analysis did not show any abnormal or unusual movements, behaviors, phonations or vocalizations. The patient took 1 bathroom break. Mild to moderate snoring was noted. The EKG was in keeping with normal sinus rhythm (NSR). Post-study, the patient indicated that sleep was better than usual.   IMPRESSION: 1. Obstructive Sleep Apnea (OSA) 2. Dysfunctions  associated with sleep stages or arousal from sleep  RECOMMENDATIONS: 1. This study demonstrates overall mild obstructive sleep apnea, moderate during supine sleep, with a total AHI of 11.9/hour, supine AHI of 17.2/hour and O2 nadir of 86%. Please note, that the near-absence of REM sleep likely underestimates his sleep disordered breathing. Given the patient's medical history and sleep related  complaints, treatment with positive airway pressure is indicated; a full-night CPAP titration study is recommended to optimize therapy. Other treatment options may include avoidance of supine sleep position along with weight loss, upper airway or jaw surgery in selected patients or the use of an oral appliance in certain patients. ENT evaluation and/or consultation with a maxillofacial surgeon or dentist may be feasible and some instances. Please note that untreated obstructive sleep apnea may carry additional perioperative morbidity. Patients with significant obstructive sleep apnea (typically, in the moderate to severe degree) should receive, if possible, perioperative PAP (positive airway pressure) therapy and the surgeons and particularly the anesthesiologists should be informed of the diagnosis and the severity of the sleep disordered breathing. If feasible, the patient may be asked to bring his/her CPAP or BiPAP machine for a planned/elective surgery.     2. This study shows sleep fragmentation and abnormal sleep stage percentages; these are nonspecific findings and per se do not signify an intrinsic sleep disorder or a cause for the patient's sleep-related symptoms. Causes include (but are not limited to) the first night effect of the sleep study, circadian rhythm disturbances, medication effect or an underlying mood disorder or medical problem.  3. The patient should be cautioned not to drive, work at heights, or operate dangerous or heavy equipment when tired or sleepy. Review and reiteration of good sleep hygiene measures should be pursued with any patient. 4. The patient will be seen in follow-up in the sleep clinic at [facility] for discussion of the test results, symptom and treatment compliance review, further management strategies, etc. The referring provider will be notified of the test results.  I certify that I have reviewed the entire raw data recording prior to the issuance of this report in  accordance with the Standards of Accreditation of the American Academy of Sleep Medicine (AASM)   [name], MD, PhD Diplomat, American Board of Neurology and Sleep Medicine (Neurology and Sleep Medicine)

## 2019-12-18 ENCOUNTER — Telehealth: Payer: Self-pay

## 2019-12-18 NOTE — Telephone Encounter (Signed)
I reached out to the pt and left a vm asking for a call back.  

## 2019-12-18 NOTE — Telephone Encounter (Signed)
-----   Message from [name], MD sent at 12/17/2019  6:11 PM EST ----- Patient referred by Dr. Jacinto Halim, seen by me on 11/14/19, diagnostic PSG on 12/04/19.   Please call and notify the patient that the recent sleep study showed mild to moderate obstructive sleep apnea. He did not sleep very well, had nearly no dream sleep and sleep apnea tends to be worse during REM/dream sleep. I recommend treatment in the form of CPAP. This will require a repeat sleep study for proper titration and mask fitting and correct monitoring of the oxygen saturations. Please explain to patient. I have placed an order in the chart. Thanks.  [name], MD, PhD [facility])

## 2019-12-19 NOTE — Telephone Encounter (Signed)
Pt called back and we reviewed sleep study results. Pt was agreeable to cpap titration study.  Pt understands he will be receiving a phone call from our sleep lab to schedule.

## 2019-12-21 ENCOUNTER — Other Ambulatory Visit: Payer: Self-pay | Admitting: Cardiology

## 2019-12-21 DIAGNOSIS — Z794 Long term (current) use of insulin: Secondary | ICD-10-CM

## 2019-12-21 DIAGNOSIS — E119 Type 2 diabetes mellitus without complications: Secondary | ICD-10-CM

## 2019-12-26 ENCOUNTER — Other Ambulatory Visit: Payer: Self-pay | Admitting: Cardiology

## 2019-12-26 DIAGNOSIS — I25118 Atherosclerotic heart disease of native coronary artery with other forms of angina pectoris: Secondary | ICD-10-CM

## 2019-12-27 ENCOUNTER — Other Ambulatory Visit: Payer: Self-pay

## 2019-12-27 ENCOUNTER — Other Ambulatory Visit: Payer: Self-pay | Admitting: Family Medicine

## 2019-12-27 ENCOUNTER — Ambulatory Visit (HOSPITAL_BASED_OUTPATIENT_CLINIC_OR_DEPARTMENT_OTHER)
Admission: RE | Admit: 2019-12-27 | Discharge: 2019-12-27 | Disposition: A | Discharge status: Home / Self Care | Payer: 59 | Source: Ambulatory Visit | Attending: Family Medicine | Admitting: Family Medicine

## 2019-12-27 DIAGNOSIS — I749 Embolism and thrombosis of unspecified artery: Secondary | ICD-10-CM

## 2020-01-05 ENCOUNTER — Inpatient Hospital Stay (HOSPITAL_COMMUNITY)
Admission: RE | Admit: 2020-01-05 | Discharge: 2020-01-05 | Disposition: A | Discharge status: Home / Self Care | Payer: Self-pay | Source: Ambulatory Visit

## 2020-01-05 NOTE — Progress Notes (Signed)
  That patient's Covid test is + on 12/15/19.  Patient does not need to be retested for COVID before procedure on 01/08/20.  Patient is within the 90 day window of no retest needed.  Results are in care everywhere

## 2020-01-08 ENCOUNTER — Ambulatory Visit (INDEPENDENT_AMBULATORY_CARE_PROVIDER_SITE_OTHER): Payer: 59 | Admitting: Neurology

## 2020-01-08 DIAGNOSIS — G4719 Other hypersomnia: Secondary | ICD-10-CM

## 2020-01-08 DIAGNOSIS — G4733 Obstructive sleep apnea (adult) (pediatric): Secondary | ICD-10-CM | POA: Diagnosis not present

## 2020-01-08 DIAGNOSIS — E669 Obesity, unspecified: Secondary | ICD-10-CM

## 2020-01-08 DIAGNOSIS — F515 Nightmare disorder: Secondary | ICD-10-CM

## 2020-01-08 DIAGNOSIS — I2510 Atherosclerotic heart disease of native coronary artery without angina pectoris: Secondary | ICD-10-CM

## 2020-01-08 DIAGNOSIS — R519 Headache, unspecified: Secondary | ICD-10-CM

## 2020-01-08 DIAGNOSIS — F39 Unspecified mood [affective] disorder: Secondary | ICD-10-CM

## 2020-01-08 DIAGNOSIS — G4720 Circadian rhythm sleep disorder, unspecified type: Secondary | ICD-10-CM

## 2020-01-08 DIAGNOSIS — I200 Unstable angina: Secondary | ICD-10-CM

## 2020-01-08 DIAGNOSIS — R351 Nocturia: Secondary | ICD-10-CM

## 2020-01-17 ENCOUNTER — Telehealth: Payer: Self-pay | Admitting: Neurology

## 2020-01-17 NOTE — Telephone Encounter (Signed)
I called pt and spoke with his wife. I advised pt that Dr. Frances Furbish reviewed their sleep study results and found that pt tolerated CPAP well. Dr. Frances Furbish recommends that pt starts CPAP at a pressure of 11 cm water pressure. I reviewed PAP compliance expectations with the pt. Pt is agreeable to starting a CPAP. I advised pt that an order will be sent to a DME, [facility], and [facility] will call the pt within about one week after they file with the pt's insurance. [facility] will show the pt how to use the machine, fit for masks, and troubleshoot the CPAP if needed. A follow up appt will need to be made for insurance purposes within 31-90 days after starting the machine. The wife advised she will tell the patient to call back and make that apt. Pt's wife verbalized understanding of results. Pt 's wife had no questions at this time but was encouraged to call back if questions arise. I have sent the order to [facility] and have received confirmation that they have received the order.  **If pt happens to call back before he has actually started the machine to schedule the apt, please schedule around 60 day mark from the day of the returned call If he waits to call after he has gotten the machine please schedule 31-90 days from day he picked machine up.

## 2020-01-17 NOTE — Telephone Encounter (Signed)
-----   Message from [name], MD sent at 01/17/2020  7:32 AM EDT ----- Patient referred by Dr. Jacinto Halim, seen by me on 11/14/19, diagnostic PSG on 12/04/19. Patient had a CPAP titration study on 01/08/20.  Please call and inform patient that I have entered an order for treatment with positive airway pressure (PAP) treatment for obstructive sleep apnea (OSA). He did well during the latest sleep study with CPAP. We will, therefore, arrange for a machine for home use through a DME (durable medical equipment) company of His choice; and I will see the patient back in follow-up in about 10 weeks. Please also explain to the patient that I will be looking out for compliance data, which can be downloaded from the machine (stored on an SD card, that is inserted in the machine) or via remote access through a modem, that is built into the machine. At the time of the followup appointment we will discuss sleep study results and how it is going with PAP treatment at home. Please advise patient to bring His machine at the time of the first FU visit, even though this is cumbersome. Bringing the machine for every visit after that will likely not be needed, but often helps for the first visit to troubleshoot if needed. Please re-enforce the importance of compliance with treatment and the need for [place] to monitor compliance data - often an insurance requirement and actually good feedback for the patient as far as how they are doing.  Also remind patient, that any interim PAP machine or mask issues should be first addressed with the DME company, as they can often help better with technical and mask fit issues. Please ask if patient has a preference regarding DME company.  Please also make sure, the patient has a follow-up appointment with me in about 10 weeks from the setup date, thanks. May see one of our nurse practitioners if needed for proper timing of the FU appointment.  Please fax or rout report to the referring provider. Thanks,    [name], MD, PhD [facility])

## 2020-01-17 NOTE — Procedures (Signed)
PATIENT'S NAME:  Eric Cohen, Eric Cohen DOB:      26-Sep-1956      MR#:    [id number]     DATE OF RECORDING: 01/08/2020 REFERRING M.D.:  Yates Decamp MD Study Performed:   CPAP  Titration HISTORY: 64 year old man with a history of hypertension, unstable angina with coronary artery disease, history of DVT, diabetes, diverticulitis, asthma, mood disorder, and obesity, who presents for a full night titration study. His baseline sleep study from 12/04/19 showed a total AHI of 11.9/hour, supine AHI of 17.2/hour and O2 nadir of 86%. The near-absence of REM sleep likely underestimated his sleep disordered breathing. The patient endorsed the Epworth Sleepiness Scale at 18/24 points. The patient's weight 231 pounds with a height of 71 (inches), resulting in a BMI of 32.4 kg/m2. The patient's neck circumference measured 18.25 inches.  CURRENT MEDICATIONS: Proventil, Aspirin, Lipitor, Symbicort, Tegretol, Plavix, Sinequan, Jardiance, Zetia, Flonase, Neurontin, Hydrodiuril, Lantus, Hyzaar, Glucophage, Singulair, Nitorstat, Minipress, Disdol, Imdur.  PROCEDURE:  This is a multichannel digital polysomnogram utilizing the SomnoStar 11.2 system.  Electrodes and sensors were applied and monitored per AASM Specifications.   EEG, EOG, Chin and Limb EMG, were sampled at 200 Hz.  ECG, Snore and Nasal Pressure, Thermal Airflow, Respiratory Effort, CPAP Flow and Pressure, Oximetry was sampled at 50 Hz. Digital video and audio were recorded.      The patient was fitted with a Respironics Dreamwear large full face mask. CPAP was initiated at 5 cmH20 with heated humidity per AASM standards and pressure was advanced to 11/cmH20 because of hypopneas, apneas and desaturations.  At a PAP pressure of 11cmH20, there was a reduction of the AHI to 0/hour with supine NREM sleep achieved and O2 nadir of 94%.   Lights Out was at 20:33 and Lights On at 04:51. Total recording time (TRT) was 498.5 minutes, with a total sleep time (TST) of 407.5 minutes. The  patient's sleep latency was 1.5 minutes. REM latency was 57.5 minutes, which is slightly reduced. The sleep efficiency was 81.7 %.    SLEEP ARCHITECTURE: WASO (Wake after sleep onset) was 89 minutes with mild to moderate sleep fragmentation noted. There were 11 minutes in Stage N1, 316 minutes Stage N2, 15 minutes Stage N3 and 65.5 minutes in Stage REM.  The percentage of Stage N1 was 2.7%, Stage N2 was 77.5%, which is increased, Stage N3 was 3.7% and Stage R (REM sleep) was 16.1%, which is mildly reduced. The arousals were noted as: 31 were spontaneous, 2 were associated with PLMs, 5 were associated with respiratory events.  RESPIRATORY ANALYSIS:  There was a total of 19 respiratory events: 0 obstructive apneas, 1 central apneas and 0 mixed apneas with a total of 1 apneas and an apnea index (AHI) of .1 /hour. There were 18 hypopneas with a hypopnea index of 2.7/hour. The patient also had 0 respiratory event related arousals (RERAs).      The total APNEA/HYPOPNEA INDEX  (AHI) was 2.8 /hour and the total RESPIRATORY DISTURBANCE INDEX was 2.8 /hour  10 events occurred in REM sleep and 9 events in NREM. The REM AHI was 9.2 /hour versus a non-REM AHI of 1.6 /hour.  The patient spent 40.5 minutes of total sleep time in the supine position and 367 minutes in non-supine. The supine AHI was 0.0, versus a non-supine AHI of 3.1.  OXYGEN SATURATION & C02:  The baseline 02 saturation was 94%, with the lowest being 88%. Time spent below 89% saturation equaled 0 minutes.  PERIODIC LIMB  MOVEMENTS:  The patient had a total of 6 Periodic Limb Movements. The Periodic Limb Movement (PLM) index was .9 and the PLM Arousal index was .3 /hour.  Audio and video analysis did not show any abnormal or unusual movements, behaviors, phonations or vocalizations. The patient took 1 bathroom break. The EKG was in keeping with normal sinus rhythm.  Post-study, the patient indicated that sleep was the same as usual.    IMPRESSION:  1. Obstructive Sleep Apnea (OSA) 2. Dysfunctions associated with sleep stages or arousal from sleep   RECOMMENDATIONS: 1. This study demonstrates resolution of the patient's obstructive sleep apnea with CPAP therapy. I will, therefore, start the patient on home CPAP treatment at a pressure of 11 cm via large Dreamwear FFM with heated humidity. The patient should be reminded to be fully compliant with PAP therapy to improve sleep related symptoms and decrease long term cardiovascular risks. The patient should be reminded, that it may take up to 3 months to get fully used to using PAP with all planned sleep. The earlier full compliance is achieved, the better long term compliance tends to be. Please note that untreated obstructive sleep apnea may carry additional perioperative morbidity. Patients with significant obstructive sleep apnea should receive perioperative PAP therapy and the surgeons and particularly the anesthesiologist should be informed of the diagnosis and the severity of the sleep disordered breathing. 2. This study shows sleep fragmentation and abnormal sleep stage percentages; these are nonspecific findings and per se do not signify an intrinsic sleep disorder or a cause for the patient's sleep-related symptoms. Causes include (but are not limited to) the first night effect of the sleep study, circadian rhythm disturbances, medication effect or an underlying mood disorder or medical problem.  3. The patient should be cautioned not to drive, work at heights, or operate dangerous or heavy equipment when tired or sleepy. Review and reiteration of good sleep hygiene measures should be pursued with any patient. 4. The patient will be seen in follow-up in the sleep clinic at [facility] for discussion of the test results, symptom and treatment compliance review, further management strategies, etc. The referring provider will be notified of the test results.   I certify that I have  reviewed the entire raw data recording prior to the issuance of this report in accordance with the Standards of Accreditation of the American Academy of Sleep Medicine (AASM)   Star Age, MD, PhD Diplomat, American Board of Neurology and Sleep Medicine (Neurology and Sleep Medicine)

## 2020-01-17 NOTE — Addendum Note (Signed)
Addended by: [name] on: 01/17/2020 07:32 AM   Modules accepted: Orders

## 2020-01-17 NOTE — Progress Notes (Signed)
Patient referred by Dr. Jacinto Halim, seen by me on 11/14/19, diagnostic PSG on 12/04/19. Patient had a CPAP titration study on 01/08/20.  Please call and inform patient that I have entered an order for treatment with positive airway pressure (PAP) treatment for obstructive sleep apnea (OSA). He did well during the latest sleep study with CPAP. We will, therefore, arrange for a machine for home use through a DME (durable medical equipment) company of His choice; and I will see the patient back in follow-up in about 10 weeks. Please also explain to the patient that I will be looking out for compliance data, which can be downloaded from the machine (stored on an SD card, that is inserted in the machine) or via remote access through a modem, that is built into the machine. At the time of the followup appointment we will discuss sleep study results and how it is going with PAP treatment at home. Please advise patient to bring His machine at the time of the first FU visit, even though this is cumbersome. Bringing the machine for every visit after that will likely not be needed, but often helps for the first visit to troubleshoot if needed. Please re-enforce the importance of compliance with treatment and the need for [place] to monitor compliance data - often an insurance requirement and actually good feedback for the patient as far as how they are doing.  Also remind patient, that any interim PAP machine or mask issues should be first addressed with the DME company, as they can often help better with technical and mask fit issues. Please ask if patient has a preference regarding DME company.  Please also make sure, the patient has a follow-up appointment with me in about 10 weeks from the setup date, thanks. May see one of our nurse practitioners if needed for proper timing of the FU appointment.  Please fax or rout report to the referring provider. Thanks,   [name], MD, PhD [facility])

## 2020-01-19 ENCOUNTER — Other Ambulatory Visit: Payer: Self-pay

## 2020-01-19 ENCOUNTER — Emergency Department (HOSPITAL_BASED_OUTPATIENT_CLINIC_OR_DEPARTMENT_OTHER)
Admission: EM | Admit: 2020-01-19 | Discharge: 2020-01-19 | Disposition: A | Discharge status: Home / Self Care | Payer: 59 | Attending: Emergency Medicine | Admitting: Emergency Medicine

## 2020-01-19 ENCOUNTER — Encounter (HOSPITAL_BASED_OUTPATIENT_CLINIC_OR_DEPARTMENT_OTHER): Payer: Self-pay

## 2020-01-19 ENCOUNTER — Emergency Department (HOSPITAL_BASED_OUTPATIENT_CLINIC_OR_DEPARTMENT_OTHER): Payer: 59

## 2020-01-19 DIAGNOSIS — M25551 Pain in right hip: Secondary | ICD-10-CM | POA: Insufficient documentation

## 2020-01-19 DIAGNOSIS — J45909 Unspecified asthma, uncomplicated: Secondary | ICD-10-CM | POA: Insufficient documentation

## 2020-01-19 DIAGNOSIS — Z91013 Allergy to seafood: Secondary | ICD-10-CM | POA: Diagnosis not present

## 2020-01-19 DIAGNOSIS — Z7984 Long term (current) use of oral hypoglycemic drugs: Secondary | ICD-10-CM | POA: Insufficient documentation

## 2020-01-19 DIAGNOSIS — I10 Essential (primary) hypertension: Secondary | ICD-10-CM | POA: Diagnosis not present

## 2020-01-19 DIAGNOSIS — Z9861 Coronary angioplasty status: Secondary | ICD-10-CM | POA: Diagnosis not present

## 2020-01-19 DIAGNOSIS — Z7982 Long term (current) use of aspirin: Secondary | ICD-10-CM | POA: Diagnosis not present

## 2020-01-19 DIAGNOSIS — Z79899 Other long term (current) drug therapy: Secondary | ICD-10-CM | POA: Insufficient documentation

## 2020-01-19 DIAGNOSIS — M79604 Pain in right leg: Secondary | ICD-10-CM | POA: Diagnosis present

## 2020-01-19 DIAGNOSIS — Z86718 Personal history of other venous thrombosis and embolism: Secondary | ICD-10-CM | POA: Insufficient documentation

## 2020-01-19 DIAGNOSIS — Z888 Allergy status to other drugs, medicaments and biological substances status: Secondary | ICD-10-CM | POA: Diagnosis not present

## 2020-01-19 DIAGNOSIS — E119 Type 2 diabetes mellitus without complications: Secondary | ICD-10-CM | POA: Insufficient documentation

## 2020-01-19 MED ORDER — ACETAMINOPHEN 500 MG PO TABS: 1000.0000 mg | ORAL_TABLET | Freq: Four times a day (QID) | ORAL | 0 refills | Status: DC | PRN

## 2020-01-19 NOTE — ED Triage Notes (Signed)
Pt c/o pain in right leg starting yesterday around his right knee, pain now radiating into right hip.

## 2020-01-19 NOTE — ED Provider Notes (Signed)
[facility] EMERGENCY DEPARTMENT Provider Note   CSN: [id number] Arrival date & time: 01/19/20  1059     History Chief Complaint  Patient presents with  . Leg Pain    Eric Cohen is a 64 y.o. male.  64 year old male with past medical history below including DVT on Xarelto, hypertension, type 2 diabetes mellitus who presents with right leg and buttock pain.  Yesterday he began having pain in his right buttock that wraps around his hip to the front of his proximal right thigh.  Pain does not go all the way down his leg.  It is worse when he first tries to get up and move around and gets better as he keeps going.  No trauma or recent change in physical activity.  Pain is in buttock, not lower back.  No bowel or bladder problems, no fevers or recent illness.  No decreased sensation or weakness of his right leg.  He does note chronic problems with his right knee and problems with his left leg due to previous DVT.  The history is provided by the patient.  Leg Pain      Past Medical History:  Diagnosis Date  . Asthma   . Depression   . Diabetes mellitus without complication (HCC)   . Diverticulitis   . DVT (deep venous thrombosis) (HCC)   . Heart disease   . Hypertension     Patient Active Problem List   Diagnosis Date Noted  . Unstable angina (HCC) 08/30/2019    Past Surgical History:  Procedure Laterality Date  . APPENDECTOMY    . CHOLECYSTECTOMY    . COLOSTOMY REVERSAL    . KNEE SURGERY    . LEFT HEART CATH AND CORONARY ANGIOGRAPHY N/A 08/31/2019   Procedure: LEFT HEART CATH AND CORONARY ANGIOGRAPHY;  Surgeon: Yates Decamp, MD;  Location: [facility] INVASIVE CV LAB;  Service: Cardiovascular;  Laterality: N/A;       Family History  Problem Relation Age of Onset  . Heart failure Mother   . Diabetes Father   . Hyperlipidemia Father     Social History   Tobacco Use  . Smoking status: Never Smoker  . Smokeless tobacco: Never Used  Substance Use Topics  .  Alcohol use: No  . Drug use: No    Home Medications Prior to Admission medications   Medication Sig Start Date End Date Taking? Authorizing Provider  albuterol (PROVENTIL HFA;VENTOLIN HFA) 108 (90 BASE) MCG/ACT inhaler Inhale 2 puffs into the lungs every 6 (six) hours as needed for wheezing.    [provider]  albuterol (PROVENTIL) (2.5 MG/3ML) 0.083% nebulizer solution Take 2.5 mg by nebulization every 6 (six) hours as needed for wheezing or shortness of breath.    [provider]  aspirin 81 MG EC tablet TAKE 1 TABLET BY MOUTH EVERY DAY 09/06/19   Toniann Fail, NP  atorvastatin (LIPITOR) 80 MG tablet Take 1 tablet (80 mg total) by mouth daily at 6 PM. 10/03/19   Yates Decamp, MD  Budesonide-Formoterol Fumarate (SYMBICORT IN) Inhale 2 puffs into the lungs daily.     [provider]  carbamazepine (TEGRETOL) 200 MG tablet Take 200 mg by mouth. 6 per day    [provider]  clopidogrel (PLAVIX) 75 MG tablet Take 1 tablet (75 mg total) by mouth daily. 10/03/19 03/31/20  Yates Decamp, MD  doxepin (SINEQUAN) 75 MG capsule Take 75 mg by mouth.    [provider]  empagliflozin (JARDIANCE) 10 MG  TABS tablet Take 10 mg by mouth daily before breakfast. 11/15/19   Miquel Dunn, NP  ezetimibe (ZETIA) 10 MG tablet Take 1 tablet (10 mg total) by mouth daily. 11/05/19 02/03/20  Adrian Prows, MD  fluticasone (FLONASE) 50 MCG/ACT nasal spray Place into both nostrils daily.    [provider]  gabapentin (NEURONTIN) 300 MG capsule Take 300 mg by mouth. TID and 2 at bedtime    [provider]  hydrochlorothiazide (HYDRODIURIL) 25 MG tablet Take 25 mg by mouth daily.    [provider]  insulin glargine (LANTUS) 100 UNIT/ML injection Inject 68 Units into the skin at bedtime.    [provider]  isosorbide mononitrate (IMDUR) 30 MG 24 hr tablet Take 1 tablet (30 mg total) by mouth daily. 10/03/19 01/31/20  Adrian Prows, MD   losartan-hydrochlorothiazide (HYZAAR) 100-25 MG tablet Take 1 tablet by mouth daily. 11/05/19   Adrian Prows, MD  metFORMIN (GLUCOPHAGE) 500 MG tablet TAKE 1 TABLET (500 MG TOTAL) BY MOUTH 2 (TWO) TIMES DAILY WITH A MEAL. 12/21/19   Miquel Dunn, NP  metoprolol succinate (TOPROL-XL) 50 MG 24 hr tablet TAKE 1 TABLET BY MOUTH EVERY DAY 12/26/19   Miquel Dunn, NP  montelukast (SINGULAIR) 4 MG chewable tablet Chew 4 mg by mouth at bedtime.    [provider]  nitroGLYCERIN (NITROSTAT) 0.4 MG SL tablet Place 1 tablet (0.4 mg total) under the tongue every 5 (five) minutes as needed for chest pain. 11/14/19 02/12/20  Adrian Prows, MD  prazosin (MINIPRESS) 5 MG capsule Take 5 mg by mouth at bedtime.    [provider]  Vitamin D, Ergocalciferol, (DRISDOL) 1.25 MG (50000 UT) CAPS capsule Take 50,000 Units by mouth every 7 (seven) days. New Rx to start taking on Sunday    [provider]    Allergies    Shellfish allergy and Lisinopril  Review of Systems   Review of Systems All other systems reviewed and are negative except that which was mentioned in HPI  Physical Exam Updated Vital Signs BP (!) 147/78 (BP Location: Right Arm)   Pulse 84   Temp 98.7 F (37.1 C) (Oral)   Resp 20   Ht 5\' 11"  (1.803 m)   Wt 102.1 kg   SpO2 95%   BMI 31.38 kg/m   Physical Exam Vitals and nursing note reviewed.  Constitutional:      General: He is not in acute distress.    Appearance: He is well-developed.  HENT:     Head: Normocephalic and atraumatic.  Eyes:     Conjunctiva/sclera: Conjunctivae normal.  Musculoskeletal:        General: No swelling, tenderness or deformity.     Cervical back: Neck supple.     Comments: No low back tenderness; pain w/ R straight leg raise  Skin:    General: Skin is warm and dry.  Neurological:     Mental Status: He is alert and oriented to person, place, and time.     Comments:  5/5 strength BLE, normal sensation BLE  Psychiatric:         Judgment: Judgment normal.     ED Results / Procedures / Treatments   Labs (all labs ordered are listed, but only abnormal results are displayed) Labs Reviewed - No data to display  EKG None  Radiology No results found.  Procedures Procedures (including critical care time)  Medications Ordered in ED Medications - No data to display  ED Course  I have reviewed the triage vital signs and the nursing notes.  Pertinent imaging results that were available during my care of the patient were reviewed by me and considered in my medical decision making (see chart for details).    MDM Rules/Calculators/A&P                      No signs or symptoms to suggest infectious process or cauda equina.  I suspect he is either having low back pain with possible nerve impingement or problems with degenerative hip.  X-rays of lumbar spine show grade 1 anterolisthesis L4-L5 with degenerative changes but no acute findings, x-ray of hip shows degenerative joints.  He may benefit from steroid injection.  I have discussed follow-up with Ortho, he has seen [facility] previously.  Discussed supportive measures. Final Clinical Impression(s) / ED Diagnoses Final diagnoses:  None    Rx / DC Orders ED Discharge Orders    None       Little, Ambrose Finland, MD 01/19/20 1328

## 2020-01-27 ENCOUNTER — Other Ambulatory Visit: Payer: Self-pay | Admitting: Cardiology

## 2020-01-27 DIAGNOSIS — I10 Essential (primary) hypertension: Secondary | ICD-10-CM

## 2020-01-27 DIAGNOSIS — E782 Mixed hyperlipidemia: Secondary | ICD-10-CM

## 2020-02-07 ENCOUNTER — Other Ambulatory Visit: Payer: Self-pay | Admitting: Cardiology

## 2020-02-08 ENCOUNTER — Telehealth: Payer: Self-pay

## 2020-02-08 ENCOUNTER — Other Ambulatory Visit: Payer: Self-pay

## 2020-02-08 ENCOUNTER — Other Ambulatory Visit: Payer: Self-pay | Admitting: Cardiology

## 2020-02-08 ENCOUNTER — Ambulatory Visit: Payer: 59 | Admitting: Cardiology

## 2020-02-08 ENCOUNTER — Encounter: Payer: Self-pay | Admitting: Cardiology

## 2020-02-08 VITALS — BP 140/77 | HR 79 | Temp 97.1°F | Resp 15 | Ht 71.0 in | Wt 223.0 lb

## 2020-02-08 DIAGNOSIS — I10 Essential (primary) hypertension: Secondary | ICD-10-CM

## 2020-02-08 DIAGNOSIS — I25118 Atherosclerotic heart disease of native coronary artery with other forms of angina pectoris: Secondary | ICD-10-CM

## 2020-02-08 DIAGNOSIS — Z86718 Personal history of other venous thrombosis and embolism: Secondary | ICD-10-CM

## 2020-02-08 DIAGNOSIS — I82442 Acute embolism and thrombosis of left tibial vein: Secondary | ICD-10-CM

## 2020-02-08 DIAGNOSIS — Z794 Long term (current) use of insulin: Secondary | ICD-10-CM

## 2020-02-08 DIAGNOSIS — E782 Mixed hyperlipidemia: Secondary | ICD-10-CM

## 2020-02-08 DIAGNOSIS — E6609 Other obesity due to excess calories: Secondary | ICD-10-CM

## 2020-02-08 DIAGNOSIS — E119 Type 2 diabetes mellitus without complications: Secondary | ICD-10-CM

## 2020-02-08 DIAGNOSIS — Z6831 Body mass index (BMI) 31.0-31.9, adult: Secondary | ICD-10-CM

## 2020-02-08 DIAGNOSIS — G4733 Obstructive sleep apnea (adult) (pediatric): Secondary | ICD-10-CM

## 2020-02-08 LAB — CMP14+EGFR
ALT: 34 IU/L (ref 0–44)
AST: 36 IU/L (ref 0–40)
Albumin/Globulin Ratio: 1.4 (ref 1.2–2.2)
Albumin: 4.9 g/dL — ABNORMAL HIGH (ref 3.8–4.8)
Alkaline Phosphatase: 88 IU/L (ref 39–117)
BUN/Creatinine Ratio: 12 (ref 10–24)
BUN: 29 mg/dL — ABNORMAL HIGH (ref 8–27)
Bilirubin Total: 1 mg/dL (ref 0.0–1.2)
CO2: 23 mmol/L (ref 20–29)
Calcium: 10.1 mg/dL (ref 8.6–10.2)
Chloride: 94 mmol/L — ABNORMAL LOW (ref 96–106)
Creatinine, Ser: 2.34 mg/dL — ABNORMAL HIGH (ref 0.76–1.27)
GFR calc Af Amer: 33 mL/min/1.73 — ABNORMAL LOW
GFR calc non Af Amer: 29 mL/min/1.73 — ABNORMAL LOW
Globulin, Total: 3.4 g/dL (ref 1.5–4.5)
Glucose: 153 mg/dL — ABNORMAL HIGH (ref 65–99)
Potassium: 4.2 mmol/L (ref 3.5–5.2)
Sodium: 136 mmol/L (ref 134–144)
Total Protein: 8.3 g/dL (ref 6.0–8.5)

## 2020-02-08 LAB — LIPID PANEL WITH LDL/HDL RATIO
Cholesterol, Total: 122 mg/dL (ref 100–199)
HDL: 35 mg/dL — ABNORMAL LOW
LDL Chol Calc (NIH): 60 mg/dL (ref 0–99)
LDL/HDL Ratio: 1.7 ratio (ref 0.0–3.6)
Triglycerides: 156 mg/dL — ABNORMAL HIGH (ref 0–149)
VLDL Cholesterol Cal: 27 mg/dL (ref 5–40)

## 2020-02-08 MED ORDER — ISOSORBIDE MONONITRATE ER 30 MG PO TB24: 30.0000 mg | ORAL_TABLET | Freq: Every day | ORAL | 0 refills | Status: DC

## 2020-02-08 NOTE — Progress Notes (Signed)
Patient was taking both hydrochlorothiazide and losartan/hydrochlorothiazide.  Blood work was recommended.  Patient has developed acute kidney injury.  Patient has been informed not to take either hydrochlorothiazide or losartan/hydrochlorothiazide until he gets blood work done in 1 week.  Increase fluid intake.  Tessa Lerner, [place], [facility]  Pager: [phone] Office: [phone]

## 2020-02-08 NOTE — Telephone Encounter (Signed)
Spoke with patient and spouse. Patient stated he takes both hctz medications and will stop for 7 days and go back to re-check levels. Both verbalized understanding.

## 2020-02-08 NOTE — Telephone Encounter (Signed)
Please call the patient and make sure he goes to get blood work done at the nearest [facility] on February 15, 2020.  Not to take hydrochlorothiazide and losartan/hydrochlorothiazide for now.  Please reiterate the importance of bringing in medication bottles at every office visit.  Please have them drink a few more glasses of water on a daily basis.

## 2020-02-08 NOTE — Telephone Encounter (Signed)
Called patient and left vm in regards to verifying his hctz medication and the need to stop taking for one week. Will follow up.

## 2020-02-08 NOTE — Progress Notes (Signed)
Looks like his renal function has plummeted down pretty bad. May need to stop his ACEi and recheck, may need renal duplex and or renal US

## 2020-02-08 NOTE — Progress Notes (Signed)
Eric Cohen Date of Birth: [birth year] MRN: [id number] Primary Care Provider:Reese, Jocelyn Lamer, MD Former Cardiology Providers: Dr. Yates Decamp  Date: 02/08/20 Last Office Visit: 11/05/2019  Chief Complaint  Patient presents with  . Follow-up    3 month  . Heart Problem  . Hyperlipidemia    HPI  Eric Cohen is a 64 y.o.  male who presents to the office with a chief complaint of " follow-up." Patient's past medical history and cardiovascular risk factors include: History of right DVT April 2020, left DVT 12/2019, insulin-dependent type 2 diabetes, hypertension, hyperlipidemia, established coronary artery disease.  Patient was last seen in the office on 11/05/2019 by Dr. Yates Decamp.  At the last office visit his losartan was changed to hydrochlorothiazide/losartan for hypertension.  He was also referred to sleep study for obstructive sleep apnea evaluation.  And he was instructed to have follow-up with primary to improve his glycemic control.  Patient has tolerated the medication change well.  He also has been evaluated by sleep medicine and has been diagnosed with sleep apnea and he started CPAP therapy yesterday night.  In addition, at the last visit his lipids were not well controlled and Zetia 10 mg p.o. daily was added.  Weight loss was recommended. He has tolerated the medications well. Most recent lipid profile reviewed.  His LDL was 115 and now is improved to 60 mg/dL.  His triglycerides are still above goal at 156 mg/dL.   Since last office visit patient states that his chest pain is resolved.  Patient has used sublingual nitroglycerin tablets, last time he used it was last week. For reason unknown he is not taking Imdur that was prescribed at the last office visit.  We will be represcribed.  Patient states that he was having pain in his calf and had an ultrasound on December 27, 2019 and was diagnosed with left popliteal vein DVT which was new compared to prior study.  He is back on  Xarelto.  He is also followed up with him hematology as this is his second DVT.   Of note, patient had presented to the hospital back in November 2020 with unstable angina pectoris underwent left heart catheterization was found to have single-vessel coronary artery disease and recommended medical management.   ALLERGIES: Allergies  Allergen Reactions  . Shellfish Allergy Anaphylaxis    "close my throat"  . Lisinopril Cough     MEDICATION LIST PRIOR TO VISIT: Current Outpatient Medications on File Prior to Visit  Medication Sig Dispense Refill  . acetaminophen (TYLENOL) 500 MG tablet Take 2 tablets (1,000 mg total) by mouth every 6 (six) hours as needed. 30 tablet 0  . albuterol (PROVENTIL HFA;VENTOLIN HFA) 108 (90 BASE) MCG/ACT inhaler Inhale 2 puffs into the lungs every 6 (six) hours as needed for wheezing.    Marland Kitchen albuterol (PROVENTIL) (2.5 MG/3ML) 0.083% nebulizer solution Take 2.5 mg by nebulization every 6 (six) hours as needed for wheezing or shortness of breath.    Marland Kitchen atorvastatin (LIPITOR) 80 MG tablet Take 1 tablet (80 mg total) by mouth daily at 6 PM. 90 tablet 2  . Budesonide-Formoterol Fumarate (SYMBICORT IN) Inhale 2 puffs into the lungs daily.     . carbamazepine (TEGRETOL) 200 MG tablet Take 200 mg by mouth. 6 per day    . clopidogrel (PLAVIX) 75 MG tablet Take 1 tablet (75 mg total) by mouth daily. 90 tablet 1  . doxepin (SINEQUAN) 75 MG capsule Take 75 mg by mouth.    Marland Kitchen  empagliflozin (JARDIANCE) 10 MG TABS tablet Take 10 mg by mouth daily before breakfast. 30 tablet 1  . ezetimibe (ZETIA) 10 MG tablet TAKE 1 TABLET BY MOUTH EVERY DAY 90 tablet 0  . fluticasone (FLONASE) 50 MCG/ACT nasal spray Place into both nostrils daily.    Marland Kitchen gabapentin (NEURONTIN) 300 MG capsule Take 300 mg by mouth. TID and 2 at bedtime    . hydrochlorothiazide (HYDRODIURIL) 25 MG tablet Take 25 mg by mouth daily.    . insulin glargine (LANTUS) 100 UNIT/ML injection Inject 68 Units into the skin at  bedtime.    Marland Kitchen losartan-hydrochlorothiazide (HYZAAR) 100-25 MG tablet TAKE 1 TABLET BY MOUTH EVERY DAY 90 tablet 0  . metFORMIN (GLUCOPHAGE) 500 MG tablet TAKE 1 TABLET (500 MG TOTAL) BY MOUTH 2 (TWO) TIMES DAILY WITH A MEAL. 60 tablet 0  . metoprolol succinate (TOPROL-XL) 50 MG 24 hr tablet TAKE 1 TABLET BY MOUTH EVERY DAY 90 tablet 0  . nitroGLYCERIN (NITROSTAT) 0.4 MG SL tablet Place 1 tablet (0.4 mg total) under the tongue every 5 (five) minutes as needed for chest pain. 25 tablet 3  . prazosin (MINIPRESS) 5 MG capsule Take 5 mg by mouth at bedtime.    . Vitamin D, Ergocalciferol, (DRISDOL) 1.25 MG (50000 UT) CAPS capsule Take 50,000 Units by mouth every 7 (seven) days. New Rx to start taking on Sunday    . aspirin 81 MG EC tablet TAKE 1 TABLET BY MOUTH EVERY DAY (Patient not taking: Reported on 02/08/2020) 90 tablet 1  . montelukast (SINGULAIR) 4 MG chewable tablet Chew 4 mg by mouth at bedtime.     No current facility-administered medications on file prior to visit.    PAST MEDICAL HISTORY: Past Medical History:  Diagnosis Date  . Asthma   . Depression   . Diabetes mellitus without complication (HCC)   . Diverticulitis   . DVT (deep venous thrombosis) (HCC)   . Heart disease   . Hypertension     PAST SURGICAL HISTORY: Past Surgical History:  Procedure Laterality Date  . APPENDECTOMY    . CHOLECYSTECTOMY    . COLOSTOMY REVERSAL    . KNEE SURGERY    . LEFT HEART CATH AND CORONARY ANGIOGRAPHY N/A 08/31/2019   Procedure: LEFT HEART CATH AND CORONARY ANGIOGRAPHY;  Surgeon: Yates Decamp, MD;  Location: [facility];  Service: Cardiovascular;  Laterality: N/A;    FAMILY HISTORY: The patient's family history includes Diabetes in his father; Heart failure in his mother; Hyperlipidemia in his father.   SOCIAL HISTORY:  The patient  reports that he has never smoked. He has never used smokeless tobacco. He reports that he does not drink alcohol or use drugs.  Review of Systems   Constitution: Negative for chills and fever.  HENT: Negative for ear discharge, ear pain and nosebleeds.   Eyes: Negative for blurred vision and discharge.  Cardiovascular: Negative for chest pain, claudication, dyspnea on exertion, leg swelling, near-syncope, orthopnea, palpitations, paroxysmal nocturnal dyspnea and syncope.  Respiratory: Negative for cough and shortness of breath.   Endocrine: Negative for polydipsia, polyphagia and polyuria.  Hematologic/Lymphatic: Negative for bleeding problem.  Skin: Negative for flushing and nail changes.  Musculoskeletal: Negative for muscle cramps, muscle weakness and myalgias.  Gastrointestinal: Negative for abdominal pain, dysphagia, hematemesis, hematochezia, melena, nausea and vomiting.  Neurological: Negative for dizziness, focal weakness and light-headedness.    PHYSICAL EXAM: Vitals with BMI 02/08/2020 01/19/2020 01/19/2020  Height 5\' 11"  - -  Weight 223 lbs - -  BMI 31.12 - -  Systolic 140 130 824  Diastolic 77 72 78  Pulse 79 78 84   CONSTITUTIONAL: Well-developed and well-nourished. No acute distress.  Ambulates with a cane. SKIN: Skin is warm and dry. No rash noted. No cyanosis. No pallor. No jaundice HEAD: Normocephalic and atraumatic.  EYES: No scleral icterus MOUTH/THROAT: Moist oral membranes.  NECK: No JVD present. No thyromegaly noted. No carotid bruits  LYMPHATIC: No visible cervical adenopathy.  CHEST Normal respiratory effort. No intercostal retractions  LUNGS: Clear to auscultation bilaterally.  No stridor. No wheezes. No rales.  CARDIOVASCULAR: Regular rate and rhythm, positive S1-S2, no murmurs rubs or gallops appreciated. ABDOMINAL: No apparent ascites.  EXTREMITIES: No peripheral edema  HEMATOLOGIC: No significant bruising NEUROLOGIC: Oriented to person, place, and time. Nonfocal. Normal muscle tone.  PSYCHIATRIC: Normal mood and affect. Normal behavior. Cooperative  CARDIAC DATABASE: EKG 10/03/2019: Sinus rhythm  72 bpm.  Early R wave transition, otherwise normal EKG. 12/09/2018, nonspecific T wave flattening in inferolateral leads not present. 02/08/2020: Normal sinus rhythm ventricular rate 79 bpm, no underlying ischemia or injury pattern.  Left heart catheterization 08/31/2019: Right dominant circulation, no significant disease. Left main: Normal.  LAD: Mild diffuse disease, mid segment has a 20% stenosis.  Mild calcification is evident.  Circumflex: Mild disease in the proximal and mid segment.  OM 2 which is a small to moderate-sized vessel, has a acute angle with a 90% ostial stenosis. LV: Normal LVEDP, EF 55 to 65% without wall motion abnormality.  Echocardiogram 08/21/2019: Left ventricle cavity is normal in size. Mild concentric hypertrophy of the left ventricle. Normal LV systolic function with visual EF 50-55%. Normal global wall motion. Doppler evidence of grade I (impaired) diastolic dysfunction, normal LAP. Calculated EF 50%. Trileaflet aortic valve with mild aortic valve leaflet thickening. Mild (Grade I) aortic regurgitation. The IVC is not well visualized.  LABORATORY DATA: CBC Latest Ref Rng & Units 09/01/2019 08/31/2019 08/30/2019  WBC 4.0 - 10.5 K/uL 10.9(H) 11.0(H) 11.8(H)  Hemoglobin 13.0 - 17.0 g/dL 12.5(L) 13.4 13.6  Hematocrit 39.0 - 52.0 % 35.6(L) 38.7(L) 39.5  Platelets 150 - 400 K/uL 181 174 246    CMP Latest Ref Rng & Units 02/07/2020 11/22/2019 10/31/2019  Glucose 65 - 99 mg/dL 235(T) 614(E) 315(Q)  BUN 8 - 27 mg/dL 00(Q) 26 13  Creatinine 0.76 - 1.27 mg/dL 6.76(P) 9.50 9.32  Sodium 134 - 144 mmol/L 136 142 140  Potassium 3.5 - 5.2 mmol/L 4.2 3.7 3.9  Chloride 96 - 106 mmol/L 94(L) 98 99  CO2 20 - 29 mmol/L 23 26 25   Calcium 8.6 - 10.2 mg/dL 9.7 9.4  Total Protein 6.0 - 8.5 g/dL 8.3 - 7.8  Total Bilirubin 0.0 - 1.2 mg/dL 1.0 - 0.7  Alkaline Phos 39 - 117 IU/L 88 - 101  AST 0 - 40 IU/L 36 - 39  ALT 0 - 44 IU/L 34 - 43    Lipid Panel     Component Value Date/Time    CHOL 122 02/07/2020 0843   TRIG 156 (H) 02/07/2020 0843   HDL 35 (L) 02/07/2020 0843   CHOLHDL 5.0 08/31/2019 1045   VLDL 31 08/31/2019 1045   LDLCALC 60 02/07/2020 0843   LDLDIRECT 115 (H) 10/31/2019 1605   LABVLDL 27 02/07/2020 0843    Lab Results  Component Value Date   HGBA1C 8.3 (H) 08/31/2019   No components found for: NTPROBNP Lab Results  Component Value Date   TSH 0.691 08/31/2019  Cardiac Panel (last 3 results) No results for input(s): CKTOTAL, CKMB, TROPONINIHS, RELINDX in the last 72 hours.  FINAL MEDICATION LIST END OF ENCOUNTER: Meds ordered this encounter  Medications  . DISCONTD: isosorbide mononitrate (IMDUR) 30 MG 24 hr tablet    Sig: Take 1 tablet (30 mg total) by mouth daily.    Dispense:  90 tablet    Refill:  0  . isosorbide mononitrate (IMDUR) 30 MG 24 hr tablet    Sig: Take 1 tablet (30 mg total) by mouth daily.    Dispense:  90 tablet    Refill:  0    Medications Discontinued During This Encounter  Medication Reason  . isosorbide mononitrate (IMDUR) 30 MG 24 hr tablet Reorder  . isosorbide mononitrate (IMDUR) 30 MG 24 hr tablet Reorder     Current Outpatient Medications:  .  acetaminophen (TYLENOL) 500 MG tablet, Take 2 tablets (1,000 mg total) by mouth every 6 (six) hours as needed., Disp: 30 tablet, Rfl: 0 .  albuterol (PROVENTIL HFA;VENTOLIN HFA) 108 (90 BASE) MCG/ACT inhaler, Inhale 2 puffs into the lungs every 6 (six) hours as needed for wheezing., Disp: , Rfl:  .  albuterol (PROVENTIL) (2.5 MG/3ML) 0.083% nebulizer solution, Take 2.5 mg by nebulization every 6 (six) hours as needed for wheezing or shortness of breath., Disp: , Rfl:  .  atorvastatin (LIPITOR) 80 MG tablet, Take 1 tablet (80 mg total) by mouth daily at 6 PM., Disp: 90 tablet, Rfl: 2 .  Budesonide-Formoterol Fumarate (SYMBICORT IN), Inhale 2 puffs into the lungs daily. , Disp: , Rfl:  .  carbamazepine (TEGRETOL) 200 MG tablet, Take 200 mg by mouth. 6 per day, Disp: , Rfl:   .  clopidogrel (PLAVIX) 75 MG tablet, Take 1 tablet (75 mg total) by mouth daily., Disp: 90 tablet, Rfl: 1 .  doxepin (SINEQUAN) 75 MG capsule, Take 75 mg by mouth., Disp: , Rfl:  .  empagliflozin (JARDIANCE) 10 MG TABS tablet, Take 10 mg by mouth daily before breakfast., Disp: 30 tablet, Rfl: 1 .  ezetimibe (ZETIA) 10 MG tablet, TAKE 1 TABLET BY MOUTH EVERY DAY, Disp: 90 tablet, Rfl: 0 .  fluticasone (FLONASE) 50 MCG/ACT nasal spray, Place into both nostrils daily., Disp: , Rfl:  .  gabapentin (NEURONTIN) 300 MG capsule, Take 300 mg by mouth. TID and 2 at bedtime, Disp: , Rfl:  .  hydrochlorothiazide (HYDRODIURIL) 25 MG tablet, Take 25 mg by mouth daily., Disp: , Rfl:  .  insulin glargine (LANTUS) 100 UNIT/ML injection, Inject 68 Units into the skin at bedtime., Disp: , Rfl:  .  losartan-hydrochlorothiazide (HYZAAR) 100-25 MG tablet, TAKE 1 TABLET BY MOUTH EVERY DAY, Disp: 90 tablet, Rfl: 0 .  metFORMIN (GLUCOPHAGE) 500 MG tablet, TAKE 1 TABLET (500 MG TOTAL) BY MOUTH 2 (TWO) TIMES DAILY WITH A MEAL., Disp: 60 tablet, Rfl: 0 .  metoprolol succinate (TOPROL-XL) 50 MG 24 hr tablet, TAKE 1 TABLET BY MOUTH EVERY DAY, Disp: 90 tablet, Rfl: 0 .  nitroGLYCERIN (NITROSTAT) 0.4 MG SL tablet, Place 1 tablet (0.4 mg total) under the tongue every 5 (five) minutes as needed for chest pain., Disp: 25 tablet, Rfl: 3 .  prazosin (MINIPRESS) 5 MG capsule, Take 5 mg by mouth at bedtime., Disp: , Rfl:  .  Vitamin D, Ergocalciferol, (DRISDOL) 1.25 MG (50000 UT) CAPS capsule, Take 50,000 Units by mouth every 7 (seven) days. New Rx to start taking on Sunday, Disp: , Rfl:  .  aspirin 81 MG EC tablet,  TAKE 1 TABLET BY MOUTH EVERY DAY (Patient not taking: Reported on 02/08/2020), Disp: 90 tablet, Rfl: 1 .  isosorbide mononitrate (IMDUR) 30 MG 24 hr tablet, Take 1 tablet (30 mg total) by mouth daily., Disp: 90 tablet, Rfl: 0 .  montelukast (SINGULAIR) 4 MG chewable tablet, Chew 4 mg by mouth at bedtime., Disp: , Rfl:    IMPRESSION:    ICD-10-CM   1. Atherosclerosis of native coronary artery of native heart with stable angina pectoris (HCC)  I25.118 PCV MYOCARDIAL PERFUSION WITH LEXISCAN    EKG 12-Lead    isosorbide mononitrate (IMDUR) 30 MG 24 hr tablet    DISCONTINUED: isosorbide mononitrate (IMDUR) 30 MG 24 hr tablet    CANCELED: EKG 12-Lead  2. Essential hypertension  I10   3. Mixed hyperlipidemia  E78.2   4. Type 2 diabetes mellitus without complication, with long-term current use of insulin (HCC)  E11.9    Z79.4   5. Obstructive sleep apnea syndrome  G47.33   6. History of DVT (deep vein thrombosis)  Z86.718   7. Acute deep vein thrombosis (DVT) of tibial vein of left lower extremity (HCC)  I82.442   8. Class 1 obesity due to excess calories with serious comorbidity and body mass index (BMI) of 31.0 to 31.9 in adult  E66.09    Z68.31      RECOMMENDATIONS: MEKAI WILKINSON is a 64 y.o. 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  Atherosclerosis of the native coronary arteries with stable angina pectoris:  EKG shows normal sinus rhythm without underlying injury pattern.  Patient is not having any active chest pain at today's office visit.  His last chest pain was yesterday when he required sublingual nitroglycerin tablets.  In the past he was recommended to start Imdur but has never initiated therapy.  Medication represcribed.  He is again educated on the side effects between long-acting nitrates and medications that are commonly used for erectile dysfunction and enlarged prostate such as Viagra/sildenafil, Levitra/vardenafil, Cialis/tadalafil.  Patient understands that these medications in conjunction have significant side effects which can increase morbidity mortality and even death.  Patient states that he has no  access to such medications and will not be taking them.  Continue statin therapy, Zetia, Plavix, Jardiance, beta-blockers, losartan, hydrochlorothiazide.  Nuclear stress test recommended to evaluate for reversible ischemia.  Mixed hyperlipidemia:  Most recent lipid profile reviewed with the patient.  Patient tolerated the addition of Zetia well.  Continue statin therapy and Zetia for now.  Patient's triglyceride levels are not at goal patient willing to change lifestyle modifications to improve triglyceride levels.  Continue to monitor.  Newly diagnosed DVT:  Patient states that he is following up with [facility] vein specialist for the management of his left lower extremity DVT in the popliteal vein.  He is also seen hematology since this is his second DVT.  Will defer management to vein specialist and hematology.  Obesity, due to excess calories: Body mass index is 31.1 kg/m. . I reviewed with the patient the importance of diet, regular physical activity/exercise, weight loss.   . Patient is educated on increasing physical activity gradually as tolerated.  With the goal of moderate intensity exercise for 30 minutes a day 5 days a week.  Orders Placed This Encounter  Procedures  . PCV MYOCARDIAL PERFUSION WITH LEXISCAN  . EKG 12-Lead   --Continue  cardiac medications as reconciled in final medication list. --Return in about 6 weeks (around 03/21/2020) for Discussion of test results, re-eval anginal symptoms. . Or sooner if needed. --Continue follow-up with your primary care physician regarding the management of your other chronic comorbid conditions.  Patient's questions and concerns were addressed to his satisfaction. He voices understanding of the instructions provided during this encounter.   This note was created using a voice recognition software as a result there may be grammatical errors inadvertently enclosed that do not reflect the nature of this encounter. Every attempt is  made to correct such errors.  Tessa Lerner, [place], [facility]  Pager: [phone] Office: [phone]

## 2020-02-11 ENCOUNTER — Telehealth: Payer: Self-pay

## 2020-02-11 NOTE — Telephone Encounter (Signed)
I spoke with patient about getting his labs re-drawn and patient stated he will be out of town on April 23. Is it okay for him to go April 26th? Please advise. Thanks!

## 2020-02-11 NOTE — Telephone Encounter (Signed)
Can he do it on 22nd?

## 2020-02-12 NOTE — Telephone Encounter (Signed)
Unable to reach patient.

## 2020-02-13 NOTE — Telephone Encounter (Signed)
Try again to reach the patient.

## 2020-02-14 NOTE — Telephone Encounter (Signed)
Spoke with patient. Patient stated he can only get these labs done on 4/26.

## 2020-02-20 ENCOUNTER — Other Ambulatory Visit: Payer: Self-pay | Admitting: Cardiology

## 2020-02-20 DIAGNOSIS — I25118 Atherosclerotic heart disease of native coronary artery with other forms of angina pectoris: Secondary | ICD-10-CM

## 2020-02-20 DIAGNOSIS — E782 Mixed hyperlipidemia: Secondary | ICD-10-CM

## 2020-02-20 LAB — MAGNESIUM: Magnesium: 1.8 mg/dL (ref 1.6–2.3)

## 2020-02-20 NOTE — Progress Notes (Signed)
He was suppose to have BMP was this done?

## 2020-02-22 ENCOUNTER — Other Ambulatory Visit: Payer: Self-pay | Admitting: Cardiology

## 2020-02-22 ENCOUNTER — Other Ambulatory Visit: Payer: Self-pay

## 2020-02-22 DIAGNOSIS — I10 Essential (primary) hypertension: Secondary | ICD-10-CM

## 2020-02-22 DIAGNOSIS — E782 Mixed hyperlipidemia: Secondary | ICD-10-CM

## 2020-02-25 ENCOUNTER — Other Ambulatory Visit: Payer: Self-pay

## 2020-02-25 ENCOUNTER — Ambulatory Visit: Payer: 59

## 2020-02-25 DIAGNOSIS — I25118 Atherosclerotic heart disease of native coronary artery with other forms of angina pectoris: Secondary | ICD-10-CM

## 2020-02-29 NOTE — Progress Notes (Signed)
Left detailed voicemail for patient to cb. Also informed patient via voicemail to restart losartan/hydrochlorothiazide and to call office back to check status and schedule repeat lab work.

## 2020-03-13 LAB — BASIC METABOLIC PANEL
BUN/Creatinine Ratio: 15 (ref 10–24)
BUN: 15 mg/dL (ref 8–27)
CO2: 22 mmol/L (ref 20–29)
Calcium: 9.6 mg/dL (ref 8.6–10.2)
Chloride: 97 mmol/L (ref 96–106)
Creatinine, Ser: 1.02 mg/dL (ref 0.76–1.27)
GFR calc Af Amer: 91 mL/min/{1.73_m2} (ref >59)
GFR calc non Af Amer: 78 mL/min/{1.73_m2} (ref >59)
Glucose: 256 mg/dL — ABNORMAL HIGH (ref 65–99)
Potassium: 4.3 mmol/L (ref 3.5–5.2)
Sodium: 137 mmol/L (ref 134–144)

## 2020-03-13 LAB — SPECIMEN STATUS REPORT

## 2020-03-21 ENCOUNTER — Ambulatory Visit: Payer: 59 | Admitting: Cardiology

## 2020-03-21 ENCOUNTER — Encounter: Payer: Self-pay | Admitting: Cardiology

## 2020-03-21 ENCOUNTER — Other Ambulatory Visit: Payer: Self-pay

## 2020-03-21 VITALS — BP 140/72 | HR 68 | Ht 71.0 in | Wt 237.0 lb

## 2020-03-21 DIAGNOSIS — E782 Mixed hyperlipidemia: Secondary | ICD-10-CM

## 2020-03-21 DIAGNOSIS — Z794 Long term (current) use of insulin: Secondary | ICD-10-CM

## 2020-03-21 DIAGNOSIS — I10 Essential (primary) hypertension: Secondary | ICD-10-CM

## 2020-03-21 DIAGNOSIS — G4733 Obstructive sleep apnea (adult) (pediatric): Secondary | ICD-10-CM

## 2020-03-21 DIAGNOSIS — Z86718 Personal history of other venous thrombosis and embolism: Secondary | ICD-10-CM

## 2020-03-21 DIAGNOSIS — E6609 Other obesity due to excess calories: Secondary | ICD-10-CM

## 2020-03-21 DIAGNOSIS — I2510 Atherosclerotic heart disease of native coronary artery without angina pectoris: Secondary | ICD-10-CM

## 2020-03-21 NOTE — Progress Notes (Signed)
Eric Cohen Date of Birth: [birth year] MRN: [id number] Primary Care Provider:Reese, Jocelyn LamerBetti, MD Former Cardiology Providers: Dr. Yates DecampJay Ganji Primary Cardiologist: Tessa LernerSunit Tidus Upchurch, DO (established care 02/08/2020)   Date: 03/21/20 Last Office Visit: 02/08/2020  Chief Complaint  Patient presents with  . Coronary Artery Disease  . Chest Pain  . Follow-up    HPI  Eric Cohen is a 64 y.o.  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  Patient was last seen in the office back in April 2021 during which time he was complaining of chest pain and was requiring sublingual nitroglycerin tablets.  Patient has established history of coronary artery disease and has multiple cardiovascular risk factors.  At that time he was recommended to undergo Lexiscan to evaluate for reversible ischemia.  Since last office visit patient states that he has not had any chest pain and has not had any sublingual nitroglycerin tablets.  His functional status remains stable.  And his nuclear stress test results were reviewed with the patient in great detail at today's office visit and independently reviewed by myself.  Results noted below for further reference.    Patient is currently working with his sleep medicine for his CPAP.    On December 27, 2019 and was diagnosed with left popliteal vein DVT which was new compared to prior study.  He is back on Xarelto.  He is also followed up with him hematology as this is his second DVT.   ALLERGIES: Allergies  Allergen Reactions  . Shellfish Allergy Anaphylaxis    "close my throat"  . Lisinopril Cough   MEDICATION LIST PRIOR TO VISIT: Current Outpatient Medications on File Prior to Visit  Medication Sig  Dispense Refill  . acetaminophen (TYLENOL) 500 MG tablet Take 2 tablets (1,000 mg total) by mouth every 6 (six) hours as needed. 30 tablet 0  . albuterol (PROVENTIL HFA;VENTOLIN HFA) 108 (90 BASE) MCG/ACT inhaler Inhale 2 puffs into the lungs every 6 (six) hours as needed for wheezing.    Marland Kitchen. albuterol (PROVENTIL) (2.5 MG/3ML) 0.083% nebulizer solution Take 2.5 mg by nebulization every 6 (six) hours as needed for wheezing or shortness of breath.    Marland Kitchen. aspirin 81 MG EC tablet TAKE 1 TABLET BY MOUTH EVERY DAY 90 tablet 1  . atorvastatin (LIPITOR) 80 MG tablet TAKE 1 TABLET (80 MG TOTAL) BY MOUTH DAILY AT 6 PM. 90 tablet 2  . Budesonide-Formoterol Fumarate (SYMBICORT IN) Inhale 2 puffs into the lungs daily.     . carbamazepine (TEGRETOL) 200 MG tablet Take 200 mg by mouth. 6 per day    . clopidogrel (PLAVIX) 75 MG tablet TAKE 1 TABLET BY MOUTH EVERY DAY 90 tablet 1  . doxepin (SINEQUAN) 75 MG capsule Take 75 mg by mouth.    . empagliflozin (JARDIANCE) 10 MG TABS tablet Take 10 mg by mouth daily before breakfast. 30 tablet 1  . ezetimibe (ZETIA) 10 MG tablet TAKE 1 TABLET BY MOUTH EVERY DAY 90 tablet 0  . fluticasone (FLONASE) 50 MCG/ACT nasal spray Place into both nostrils daily.    Marland Kitchen. gabapentin (NEURONTIN) 300 MG capsule Take 300 mg by mouth. TID and 2 at bedtime    . hydrochlorothiazide (HYDRODIURIL) 25 MG tablet Take 25 mg by mouth daily.    .Marland Kitchen  insulin glargine (LANTUS) 100 UNIT/ML injection Inject 68 Units into the skin at bedtime.    . isosorbide mononitrate (IMDUR) 30 MG 24 hr tablet Take 1 tablet (30 mg total) by mouth daily. 90 tablet 0  . losartan-hydrochlorothiazide (HYZAAR) 100-25 MG tablet TAKE 1 TABLET BY MOUTH EVERY DAY 90 tablet 0  . metFORMIN (GLUCOPHAGE) 500 MG tablet TAKE 1 TABLET (500 MG TOTAL) BY MOUTH 2 (TWO) TIMES DAILY WITH A MEAL. 60 tablet 0  . metoprolol succinate (TOPROL-XL) 50 MG 24 hr tablet TAKE 1 TABLET BY MOUTH EVERY DAY 90 tablet 0  . montelukast (SINGULAIR) 4 MG  chewable tablet Chew 4 mg by mouth at bedtime.    . nitroGLYCERIN (NITROSTAT) 0.4 MG SL tablet Place 1 tablet (0.4 mg total) under the tongue every 5 (five) minutes as needed for chest pain. 25 tablet 3  . prazosin (MINIPRESS) 5 MG capsule Take 5 mg by mouth at bedtime.    . Vitamin D, Ergocalciferol, (DRISDOL) 1.25 MG (50000 UT) CAPS capsule Take 50,000 Units by mouth every 7 (seven) days. New Rx to start taking on Sunday     No current facility-administered medications on file prior to visit.    PAST MEDICAL HISTORY: Past Medical History:  Diagnosis Date  . Asthma   . Depression   . Diabetes mellitus without complication (HCC)   . Diverticulitis   . DVT (deep venous thrombosis) (HCC)   . Heart disease   . Hypertension     PAST SURGICAL HISTORY: Past Surgical History:  Procedure Laterality Date  . APPENDECTOMY    . CHOLECYSTECTOMY    . COLOSTOMY REVERSAL    . KNEE SURGERY    . LEFT HEART CATH AND CORONARY ANGIOGRAPHY N/A 08/31/2019   Procedure: LEFT HEART CATH AND CORONARY ANGIOGRAPHY;  Surgeon: Yates Decamp, MD;  Location: [facility];  Service: Cardiovascular;  Laterality: N/A;    FAMILY HISTORY: The patient's family history includes Diabetes in his father; Heart failure in his mother; Hyperlipidemia in his father.   SOCIAL HISTORY:  The patient  reports that he has never smoked. He has never used smokeless tobacco. He reports that he does not drink alcohol or use drugs.  Review of Systems  Constitution: Negative for chills and fever.  HENT: Negative for ear discharge, ear pain and nosebleeds.   Eyes: Negative for blurred vision and discharge.  Cardiovascular: Negative for chest pain, claudication, dyspnea on exertion, leg swelling, near-syncope, orthopnea, palpitations, paroxysmal nocturnal dyspnea and syncope.  Respiratory: Negative for cough and shortness of breath.   Endocrine: Negative for polydipsia, polyphagia and polyuria.  Hematologic/Lymphatic: Negative  for bleeding problem.  Skin: Negative for flushing and nail changes.  Musculoskeletal: Negative for muscle cramps, muscle weakness and myalgias.  Gastrointestinal: Negative for abdominal pain, dysphagia, hematemesis, hematochezia, melena, nausea and vomiting.  Neurological: Negative for dizziness, focal weakness and light-headedness.    PHYSICAL EXAM: Vitals with BMI 03/21/2020 02/08/2020 01/19/2020  Height 5\' 11"  5\' 11"  -  Weight 237 lbs 223 lbs -  BMI 33.07 31.12 -  Systolic 140 140  Diastolic 72 77 72  Pulse 68 79 78   CONSTITUTIONAL: Well-developed and well-nourished. No acute distress.  Ambulates with a cane. SKIN: Skin is warm and dry. No rash noted. No cyanosis. No pallor. No jaundice HEAD: Normocephalic and atraumatic.  EYES: No scleral icterus MOUTH/THROAT: Moist oral membranes.  NECK: No JVD present. No thyromegaly noted. No carotid bruits  LYMPHATIC: No visible cervical adenopathy.  CHEST Normal  respiratory effort. No intercostal retractions  LUNGS: Clear to auscultation bilaterally.  No stridor. No wheezes. No rales.  CARDIOVASCULAR: Regular rate and rhythm, positive S1-S2, no murmurs rubs or gallops appreciated. ABDOMINAL: No apparent ascites.  EXTREMITIES: No peripheral edema  HEMATOLOGIC: No significant bruising NEUROLOGIC: Oriented to person, place, and time. Nonfocal. Normal muscle tone.  PSYCHIATRIC: Normal mood and affect. Normal behavior. Cooperative  CARDIAC DATABASE: EKG 10/03/2019: Sinus rhythm 72 bpm.  Early R wave transition, otherwise normal EKG. 12/09/2018, nonspecific T wave flattening in inferolateral leads not present. 02/08/2020: Normal sinus rhythm ventricular rate 79 bpm, no underlying ischemia or injury pattern.  Echocardiogram 08/21/2019: Left ventricle cavity is normal in size. Mild concentric hypertrophy of the left ventricle. Normal LV systolic function with visual EF 50-55%. Normal global wall motion. Doppler evidence of grade I (impaired)  diastolic dysfunction, normal LAP. Calculated EF 50%. Trileaflet aortic valve with mild aortic valve leaflet thickening. Mild (Grade I) aortic regurgitation. The IVC is not well visualized.  Stress Test: Lexiscan (Walking with Camelia Phenes) Sestamibi Stress Test 02/25/2020: Nondiagnostic ECG stress. Myocardial perfusion is normal. Overall LV systolic function is normal without regional wall motion abnormalities. Stress LV EF: 55%.  No previous exam available for comparison.  Left heart catheterization 08/31/2019: Right dominant circulation, no significant disease. Left main: Normal.  LAD: Mild diffuse disease, mid segment has a 20% stenosis.  Mild calcification is evident.  Circumflex: Mild disease in the proximal and mid segment.  OM 2 which is a small to moderate-sized vessel, has a acute angle with a 90% ostial stenosis. LV: Normal LVEDP, EF 55 to 65% without wall motion abnormality.  LABORATORY DATA: CBC Latest Ref Rng & Units 09/01/2019 08/31/2019 08/30/2019  WBC 4.0 - 10.5 K/uL 10.9(H) 11.0(H) 11.8(H)  Hemoglobin 13.0 - 17.0 g/dL 12.5(L) 13.4 13.6  Hematocrit 39.0 - 52.0 % 35.6(L) 38.7(L) 39.5  Platelets 150 - 400 K/uL 181 174 246    CMP Latest Ref Rng & Units 02/19/2020 02/07/2020 11/22/2019  Glucose 65 - 99 mg/dL 962(E) 366(Q) 947(M)  BUN 8 - 27 mg/dL 15 54(Y) 26  Creatinine 0.76 - 1.27 mg/dL 5.03 5.46(F) 6.81  Sodium 134 - 144 mmol/L 137 136 142  Potassium 3.5 - 5.2 mmol/L 4.3 4.2 3.7  Chloride 96 - 106 mmol/L 97 94(L) 98  CO2 20 - 29 mmol/L 22 23 26   Calcium 8.6 - 10.2 mg/dL 9.6 9.7  Total Protein 6.0 - 8.5 g/dL - 8.3 -  Total Bilirubin 0.0 - 1.2 mg/dL - 1.0 -  Alkaline Phos 39 - 117 IU/L - 88 -  AST 0 - 40 IU/L - 36 -  ALT 0 - 44 IU/L - 34 -    Lipid Panel     Component Value Date/Time   CHOL 122 02/07/2020 0843   TRIG 156 (H) 02/07/2020 0843   HDL 35 (L) 02/07/2020 0843   CHOLHDL 5.0 08/31/2019 1045   VLDL 31 08/31/2019 1045   LDLCALC 60 02/07/2020 0843    LDLDIRECT 115 (H) 10/31/2019 1605   LABVLDL 27 02/07/2020 0843    Lab Results  Component Value Date   HGBA1C 8.3 (H) 08/31/2019   No components found for: NTPROBNP Lab Results  Component Value Date   TSH 0.691 08/31/2019    Cardiac Panel (last 3 results) No results for input(s): CKTOTAL, CKMB, TROPONINIHS, RELINDX in the last 72 hours.  FINAL MEDICATION LIST END OF ENCOUNTER: No orders of the defined types were placed in this encounter.   There  are no discontinued medications.   Current Outpatient Medications:  .  acetaminophen (TYLENOL) 500 MG tablet, Take 2 tablets (1,000 mg total) by mouth every 6 (six) hours as needed., Disp: 30 tablet, Rfl: 0 .  albuterol (PROVENTIL HFA;VENTOLIN HFA) 108 (90 BASE) MCG/ACT inhaler, Inhale 2 puffs into the lungs every 6 (six) hours as needed for wheezing., Disp: , Rfl:  .  albuterol (PROVENTIL) (2.5 MG/3ML) 0.083% nebulizer solution, Take 2.5 mg by nebulization every 6 (six) hours as needed for wheezing or shortness of breath., Disp: , Rfl:  .  aspirin 81 MG EC tablet, TAKE 1 TABLET BY MOUTH EVERY DAY, Disp: 90 tablet, Rfl: 1 .  atorvastatin (LIPITOR) 80 MG tablet, TAKE 1 TABLET (80 MG TOTAL) BY MOUTH DAILY AT 6 PM., Disp: 90 tablet, Rfl: 2 .  Budesonide-Formoterol Fumarate (SYMBICORT IN), Inhale 2 puffs into the lungs daily. , Disp: , Rfl:  .  carbamazepine (TEGRETOL) 200 MG tablet, Take 200 mg by mouth. 6 per day, Disp: , Rfl:  .  clopidogrel (PLAVIX) 75 MG tablet, TAKE 1 TABLET BY MOUTH EVERY DAY, Disp: 90 tablet, Rfl: 1 .  doxepin (SINEQUAN) 75 MG capsule, Take 75 mg by mouth., Disp: , Rfl:  .  empagliflozin (JARDIANCE) 10 MG TABS tablet, Take 10 mg by mouth daily before breakfast., Disp: 30 tablet, Rfl: 1 .  ezetimibe (ZETIA) 10 MG tablet, TAKE 1 TABLET BY MOUTH EVERY DAY, Disp: 90 tablet, Rfl: 0 .  fluticasone (FLONASE) 50 MCG/ACT nasal spray, Place into both nostrils daily., Disp: , Rfl:  .  gabapentin (NEURONTIN) 300 MG capsule, Take 300  mg by mouth. TID and 2 at bedtime, Disp: , Rfl:  .  hydrochlorothiazide (HYDRODIURIL) 25 MG tablet, Take 25 mg by mouth daily., Disp: , Rfl:  .  insulin glargine (LANTUS) 100 UNIT/ML injection, Inject 68 Units into the skin at bedtime., Disp: , Rfl:  .  isosorbide mononitrate (IMDUR) 30 MG 24 hr tablet, Take 1 tablet (30 mg total) by mouth daily., Disp: 90 tablet, Rfl: 0 .  losartan-hydrochlorothiazide (HYZAAR) 100-25 MG tablet, TAKE 1 TABLET BY MOUTH EVERY DAY, Disp: 90 tablet, Rfl: 0 .  metFORMIN (GLUCOPHAGE) 500 MG tablet, TAKE 1 TABLET (500 MG TOTAL) BY MOUTH 2 (TWO) TIMES DAILY WITH A MEAL., Disp: 60 tablet, Rfl: 0 .  metoprolol succinate (TOPROL-XL) 50 MG 24 hr tablet, TAKE 1 TABLET BY MOUTH EVERY DAY, Disp: 90 tablet, Rfl: 0 .  montelukast (SINGULAIR) 4 MG chewable tablet, Chew 4 mg by mouth at bedtime., Disp: , Rfl:  .  nitroGLYCERIN (NITROSTAT) 0.4 MG SL tablet, Place 1 tablet (0.4 mg total) under the tongue every 5 (five) minutes as needed for chest pain., Disp: 25 tablet, Rfl: 3 .  prazosin (MINIPRESS) 5 MG capsule, Take 5 mg by mouth at bedtime., Disp: , Rfl:  .  Vitamin D, Ergocalciferol, (DRISDOL) 1.25 MG (50000 UT) CAPS capsule, Take 50,000 Units by mouth every 7 (seven) days. New Rx to start taking on Sunday, Disp: , Rfl:   IMPRESSION:    ICD-10-CM   1. Atherosclerosis of native coronary artery of native heart without angina pectoris  I25.10   2. Essential hypertension  I10   3. Mixed hyperlipidemia  E78.2   4. Type 2 diabetes mellitus without complication, with long-term current use of insulin (HCC)  E11.9    Z79.4   5. Long-term insulin use (HCC)  Z79.4   6. Obstructive sleep apnea syndrome  G47.33   7. History of DVT (  deep vein thrombosis)  Z86.718   8. Class 1 obesity due to excess calories with serious comorbidity and body mass index (BMI) of 31.0 to 31.9 in adult  E66.09    Z68.31   9. Diabetes mellitus with coincident hypertension (HCC)  E11.9    I10       RECOMMENDATIONS: Eric Cohen is a 64 y.o. 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  Atherosclerosis of the native coronary arteries with stable angina pectoris:  EKG shows normal sinus rhythm without underlying injury pattern.  We will follow no additional anginal chest pain since last office visit.  Patient underwent nuclear stress test last time since its results were at today's office visit.  Medications were reconciled as per his memory.  Patient did not bring his medication list and therefore it may not be an accurate representation of the medication he currently taking.  Patient is asked to call the office once he reaches home for medication reconciliation.  Continue statin therapy, Zetia, Plavix, Jardiance, beta-blockers, losartan, hydrochlorothiazide.  Mixed hyperlipidemia:  Most recent lipid profile reviewed with the patient.  Continue statin therapy and Zetia for now.  Patient's triglyceride levels are not at goal patient willing to change lifestyle modifications to improve triglyceride levels.  Continue to monitor.  Recently diagnosed DVT:  Patient states that he is following up with [facility] vein specialist for the management of his left lower extremity DVT in the popliteal vein.  He is also seen hematology since this is his second DVT.  Will defer management to vein specialist and hematology.  Obesity, due to excess calories: Body mass index is 33.05 kg/m. . I reviewed with the patient the importance of diet, regular physical activity/exercise, weight loss.   . Patient is educated on increasing physical activity gradually as tolerated.  With the goal of moderate intensity exercise for 30 minutes a day 5 days a week.  No orders of the defined types were placed in this  encounter.  --Continue cardiac medications as reconciled in final medication list. --Return in about 6 months (around 09/21/2020). Or sooner if needed. --Continue follow-up with your primary care physician regarding the management of your other chronic comorbid conditions.  Patient's questions and concerns were addressed to his satisfaction. He voices understanding of the instructions provided during this encounter.   This note was created using a voice recognition software as a result there may be grammatical errors inadvertently enclosed that do not reflect the nature of this encounter. Every attempt is made to correct such errors.  Tessa Lerner, [place], [facility]  Pager: [phone] Office: [phone]

## 2020-04-27 ENCOUNTER — Other Ambulatory Visit: Payer: Self-pay | Admitting: Cardiology

## 2020-04-27 DIAGNOSIS — E782 Mixed hyperlipidemia: Secondary | ICD-10-CM

## 2020-04-27 DIAGNOSIS — I10 Essential (primary) hypertension: Secondary | ICD-10-CM

## 2020-07-22 ENCOUNTER — Other Ambulatory Visit: Payer: Self-pay | Admitting: Cardiology

## 2020-07-22 DIAGNOSIS — I10 Essential (primary) hypertension: Secondary | ICD-10-CM

## 2020-07-22 DIAGNOSIS — E782 Mixed hyperlipidemia: Secondary | ICD-10-CM

## 2020-09-01 ENCOUNTER — Other Ambulatory Visit: Payer: Self-pay | Admitting: Cardiology

## 2020-09-01 DIAGNOSIS — I25118 Atherosclerotic heart disease of native coronary artery with other forms of angina pectoris: Secondary | ICD-10-CM

## 2020-09-22 ENCOUNTER — Ambulatory Visit: Payer: 59 | Admitting: Cardiology

## 2020-10-02 ENCOUNTER — Emergency Department (HOSPITAL_BASED_OUTPATIENT_CLINIC_OR_DEPARTMENT_OTHER)
Admission: EM | Admit: 2020-10-02 | Discharge: 2020-10-02 | Disposition: A | Discharge status: Home / Self Care | Payer: 59 | Attending: Emergency Medicine | Admitting: Emergency Medicine

## 2020-10-02 ENCOUNTER — Encounter (HOSPITAL_BASED_OUTPATIENT_CLINIC_OR_DEPARTMENT_OTHER): Payer: Self-pay | Admitting: *Deleted

## 2020-10-02 ENCOUNTER — Other Ambulatory Visit: Payer: Self-pay

## 2020-10-02 DIAGNOSIS — T50Z95A Adverse effect of other vaccines and biological substances, initial encounter: Secondary | ICD-10-CM

## 2020-10-02 DIAGNOSIS — I10 Essential (primary) hypertension: Secondary | ICD-10-CM | POA: Insufficient documentation

## 2020-10-02 DIAGNOSIS — Z794 Long term (current) use of insulin: Secondary | ICD-10-CM | POA: Insufficient documentation

## 2020-10-02 DIAGNOSIS — T887XXA Unspecified adverse effect of drug or medicament, initial encounter: Secondary | ICD-10-CM | POA: Insufficient documentation

## 2020-10-02 DIAGNOSIS — R509 Fever, unspecified: Secondary | ICD-10-CM | POA: Diagnosis not present

## 2020-10-02 DIAGNOSIS — E119 Type 2 diabetes mellitus without complications: Secondary | ICD-10-CM | POA: Diagnosis not present

## 2020-10-02 DIAGNOSIS — R059 Cough, unspecified: Secondary | ICD-10-CM | POA: Insufficient documentation

## 2020-10-02 DIAGNOSIS — Z7982 Long term (current) use of aspirin: Secondary | ICD-10-CM | POA: Diagnosis not present

## 2020-10-02 DIAGNOSIS — T50B95A Adverse effect of other viral vaccines, initial encounter: Secondary | ICD-10-CM | POA: Insufficient documentation

## 2020-10-02 DIAGNOSIS — Z79899 Other long term (current) drug therapy: Secondary | ICD-10-CM | POA: Insufficient documentation

## 2020-10-02 DIAGNOSIS — Z7984 Long term (current) use of oral hypoglycemic drugs: Secondary | ICD-10-CM | POA: Insufficient documentation

## 2020-10-02 DIAGNOSIS — J45909 Unspecified asthma, uncomplicated: Secondary | ICD-10-CM | POA: Insufficient documentation

## 2020-10-02 DIAGNOSIS — M79602 Pain in left arm: Secondary | ICD-10-CM | POA: Insufficient documentation

## 2020-10-02 NOTE — ED Provider Notes (Signed)
[facility] EMERGENCY DEPARTMENT Provider Note   CSN: [id number] Arrival date & time: 10/02/20  1227     History No chief complaint on file.   Eric Cohen is a 64 y.o. male.  HPI 64 year old male with a history of asthma, depression, DM type II, DVT, hypertension presents to the ER with complaints of a reaction to the COVID-19 booster vaccine.  Patient states that he got the Pfizer blister 3 days ago in his left arm.  He developed some swelling to the area and some soreness.  He feels like it is traveling into his armpit now.  He has been working out in the fields work and has been doing some heavy lifting in the last 3 days.  He states he has been having a fever on and off for the last 3 days.  Taking Tylenol and ibuprofen, and the fever has been responding.  He has also developed a dry cough.  Denies any chest pain or shortness of breath.  Denies any throat swelling, difficulty breathing, difficulty swallowing.  Denies any redness to the area.  Denies any abdominal pain, dysuria or hematuria.  States that he called his PCP and was told to come here to be evaluated.    Past Medical History:  Diagnosis Date  . Asthma   . Depression   . Diabetes mellitus without complication (HCC)   . Diverticulitis   . DVT (deep venous thrombosis) (HCC)   . Heart disease   . Hypertension     Patient Active Problem List   Diagnosis Date Noted  . Unstable angina (HCC) 08/30/2019    Past Surgical History:  Procedure Laterality Date  . APPENDECTOMY    . CHOLECYSTECTOMY    . COLOSTOMY REVERSAL    . KNEE SURGERY    . LEFT HEART CATH AND CORONARY ANGIOGRAPHY N/A 08/31/2019   Procedure: LEFT HEART CATH AND CORONARY ANGIOGRAPHY;  Surgeon: Yates Decamp, MD;  Location: [facility];  Service: Cardiovascular;  Laterality: N/A;       Family History  Problem Relation Age of Onset  . Heart failure Mother   . Diabetes Father   . Hyperlipidemia Father     Social History   Tobacco  Use  . Smoking status: Never Smoker  . Smokeless tobacco: Never Used  Vaping Use  . Vaping Use: Never used  Substance Use Topics  . Alcohol use: No  . Drug use: No    Home Medications Prior to Admission medications   Medication Sig Start Date End Date Taking? Authorizing Provider  acetaminophen (TYLENOL) 500 MG tablet Take 2 tablets (1,000 mg total) by mouth every 6 (six) hours as needed. 01/19/20   Little, Ambrose Finland, MD  albuterol (PROVENTIL HFA;VENTOLIN HFA) 108 (90 BASE) MCG/ACT inhaler Inhale 2 puffs into the lungs every 6 (six) hours as needed for wheezing.    [provider]  albuterol (PROVENTIL) (2.5 MG/3ML) 0.083% nebulizer solution Take 2.5 mg by nebulization every 6 (six) hours as needed for wheezing or shortness of breath.    [provider]  aspirin 81 MG EC tablet TAKE 1 TABLET BY MOUTH EVERY DAY 09/06/19   Toniann Fail, NP  atorvastatin (LIPITOR) 80 MG tablet TAKE 1 TABLET (80 MG TOTAL) BY MOUTH DAILY AT 6 PM. 02/20/20   Yates Decamp, MD  Budesonide-Formoterol Fumarate (SYMBICORT IN) Inhale 2 puffs into the lungs daily.     [provider]  carbamazepine (TEGRETOL) 200 MG tablet Take 200  mg by mouth. 6 per day    [provider]  clopidogrel (PLAVIX) 75 MG tablet TAKE 1 TABLET BY MOUTH EVERY DAY 09/01/20   Tolia, Sunit, DO  doxepin (SINEQUAN) 75 MG capsule Take 75 mg by mouth.    [provider]  empagliflozin (JARDIANCE) 10 MG TABS tablet Take 10 mg by mouth daily before breakfast. 11/15/19   Toniann FailKelley, Ashton Haynes, NP  ezetimibe (ZETIA) 10 MG tablet TAKE 1 TABLET BY MOUTH EVERY DAY 07/22/20   Tolia, Sunit, DO  fluticasone (FLONASE) 50 MCG/ACT nasal spray Place into both nostrils daily.    [provider]  gabapentin (NEURONTIN) 300 MG capsule Take 300 mg by mouth. TID and 2 at bedtime    [provider]  hydrochlorothiazide (HYDRODIURIL) 25 MG tablet Take 25 mg by mouth daily.    [provider]   insulin glargine (LANTUS) 100 UNIT/ML injection Inject 68 Units into the skin at bedtime.    [provider]  isosorbide mononitrate (IMDUR) 30 MG 24 hr tablet Take 1 tablet (30 mg total) by mouth daily. 02/08/20 05/08/20  Tolia, Sunit, DO  losartan-hydrochlorothiazide (HYZAAR) 100-25 MG tablet TAKE 1 TABLET BY MOUTH EVERY DAY 07/22/20   Tolia, Sunit, DO  metFORMIN (GLUCOPHAGE) 500 MG tablet TAKE 1 TABLET (500 MG TOTAL) BY MOUTH 2 (TWO) TIMES DAILY WITH A MEAL. 12/21/19   Toniann FailKelley, Ashton Haynes, NP  metoprolol succinate (TOPROL-XL) 50 MG 24 hr tablet TAKE 1 TABLET BY MOUTH EVERY DAY 12/26/19   Toniann FailKelley, Ashton Haynes, NP  montelukast (SINGULAIR) 4 MG chewable tablet Chew 4 mg by mouth at bedtime.    [provider]  nitroGLYCERIN (NITROSTAT) 0.4 MG SL tablet Place 1 tablet (0.4 mg total) under the tongue every 5 (five) minutes as needed for chest pain. 11/14/19 03/21/20  Yates DecampGanji, Jay, MD  prazosin (MINIPRESS) 5 MG capsule Take 5 mg by mouth at bedtime.    [provider]  Vitamin D, Ergocalciferol, (DRISDOL) 1.25 MG (50000 UT) CAPS capsule Take 50,000 Units by mouth every 7 (seven) days. New Rx to start taking on Sunday    [provider]    Allergies    Shellfish allergy and Lisinopril  Review of Systems   Review of Systems  Constitutional: Positive for fever.  HENT: Negative for congestion, ear pain, facial swelling, sore throat, trouble swallowing and voice change.   Respiratory: Positive for cough. Negative for shortness of breath.   Cardiovascular: Negative for chest pain.  Gastrointestinal: Negative for abdominal pain.  Skin: Negative for color change.    Physical Exam Updated Vital Signs BP (!) 148/91 (BP Location: Right Arm)   Pulse 87   Temp 98.3 F (36.8 C) (Oral)   Resp 18   Ht 5\' 11"  (1.803 m)   Wt 107.5 kg   SpO2 98%   BMI 33.05 kg/m   Physical Exam Vitals and nursing note reviewed.  Constitutional:      General: He is not in acute  distress.    Appearance: He is well-developed and well-nourished. He is obese. He is not ill-appearing, toxic-appearing or diaphoretic.  HENT:     Head: Normocephalic and atraumatic.     Mouth/Throat:     Comments: Oropharynx non erythematous without exudates, uvula midline, no unilateral tonsillar swelling, tongue normal size and midline, no sublingual/submandibular swellimg, tolerating secretions well    Eyes:     Conjunctiva/sclera: Conjunctivae normal.  Cardiovascular:     Rate and Rhythm: Normal rate and regular rhythm.  Pulses: Normal pulses.     Heart sounds: Normal heart sounds. No murmur heard.   Pulmonary:     Effort: Pulmonary effort is normal. No respiratory distress.     Breath sounds: Normal breath sounds.     Comments: Lung sounds clear, no rales wheezes or rhonchi Chest:     Comments: Mild axillary lymphadenopathy Abdominal:     General: Abdomen is flat.     Palpations: Abdomen is soft.     Tenderness: There is no abdominal tenderness.  Musculoskeletal:        General: No tenderness or edema. Normal range of motion.     Cervical back: Neck supple.     Right lower leg: No edema.     Left lower leg: No edema.  Skin:    General: Skin is warm and dry.     Capillary Refill: Capillary refill takes less than 2 seconds.     Findings: No erythema.     Comments: Mild to moderate swelling of to the deltoid.  No noticeable erythema, streaking, fluctuance. Mildly warm to the touch   Neurological:     General: No focal deficit present.     Mental Status: He is alert and oriented to person, place, and time.     Sensory: No sensory deficit.     Motor: No weakness.  Psychiatric:        Mood and Affect: Mood and affect normal.     ED Results / Procedures / Treatments   Labs (all labs ordered are listed, but only abnormal results are displayed) Labs Reviewed - No data to display  EKG None  Radiology No results found.  Procedures Procedures (including critical  care time)  Medications Ordered in ED Medications - No data to display  ED Course  I have reviewed the triage vital signs and the nursing notes.  Pertinent labs & imaging results that were available during my care of the patient were reviewed by me and considered in my medical decision making (see chart for details).    MDM Rules/Calculators/A&P                          64 year old male complaining of left arm soreness, dry cough, and intermittent fevers since the Covid vaccine 3 days ago.  Presentation, he is alert, oriented, nontoxic-appearing, no acute distress, resting comfortably in the ER bed.  Speaking full sentences no increased work of breathing.  Vitals on arrival overall very reassuring.  Afebrile here.  Physical exam with some swelling to the deltoid, no overlying erythema, although there was some mild warmth to the deltoid.  No noticeable fluctuance.  He has some mild axillary lymphadenopathy.  Lung sounds clear.  No lower extremity edema.  Abdomen is soft and nontender.  Patient is denying any chest pain or shortness of breath.  Endorsing a dry cough.  No signs of injection site infection.  Patient is denying any urinary symptoms, no abdominal pain.  Explained to the patient that this is likely his normal immune response to getting Covid booster.  I offered him a chest x-ray to further evaluate his cough however he refused this.  Low suspicion for infectious etiology given recent Covid vaccine booster.  Encouraged applying ice to the area of injection, continuing to take Tylenol or ibuprofen as needed.  Encouraged him to come back to the ER or see his PCP if his symptoms persist or get worse.  He voiced understanding and is  agreeable.  At the stage in the ED course, the patient is medically screened and stable for discharge. Final Clinical Impression(s) / ED Diagnoses Final diagnoses:  Vaccine reaction, initial encounter    Rx / DC Orders ED Discharge Orders    None        Mare Ferrari, PA-C 10/02/20 1427    Milagros Loll, MD 10/03/20 [phone]

## 2020-10-02 NOTE — ED Triage Notes (Addendum)
Soreness to his left arm since Covid booster shot 3 days ago. He has had a fever since.

## 2020-10-02 NOTE — ED Notes (Signed)
Pt. Has swelling in the L upper arm where he had his Covid Booster shot on Monday. Pt. Also reports he has had a fever and swelling under the L armpit.

## 2020-10-02 NOTE — Discharge Instructions (Signed)
Your symptoms are a normal response to the vaccine. They should resolve on their own over the next few days. Please return to the ER or seek medical help if your symptoms do not improve or worsen

## 2020-10-06 ENCOUNTER — Other Ambulatory Visit: Payer: Self-pay

## 2020-10-06 ENCOUNTER — Encounter: Payer: Self-pay | Admitting: Cardiology

## 2020-10-06 ENCOUNTER — Ambulatory Visit: Payer: 59 | Admitting: Cardiology

## 2020-10-06 VITALS — BP 154/86 | HR 83 | Resp 16 | Ht 71.0 in | Wt 244.4 lb

## 2020-10-06 DIAGNOSIS — E6609 Other obesity due to excess calories: Secondary | ICD-10-CM

## 2020-10-06 DIAGNOSIS — I10 Essential (primary) hypertension: Secondary | ICD-10-CM

## 2020-10-06 DIAGNOSIS — Z794 Long term (current) use of insulin: Secondary | ICD-10-CM

## 2020-10-06 DIAGNOSIS — Z86718 Personal history of other venous thrombosis and embolism: Secondary | ICD-10-CM

## 2020-10-06 DIAGNOSIS — E782 Mixed hyperlipidemia: Secondary | ICD-10-CM

## 2020-10-06 DIAGNOSIS — G4733 Obstructive sleep apnea (adult) (pediatric): Secondary | ICD-10-CM

## 2020-10-06 DIAGNOSIS — I25118 Atherosclerotic heart disease of native coronary artery with other forms of angina pectoris: Secondary | ICD-10-CM

## 2020-10-06 MED ORDER — NITROGLYCERIN 0.4 MG SL SUBL: 0.4000 mg | SUBLINGUAL_TABLET | SUBLINGUAL | 3 refills | Status: DC | PRN

## 2020-10-06 NOTE — Progress Notes (Signed)
Eric Cohen Date of Birth: 1956/01/05 MRN: [id number] Primary Care Provider:Reese, Jocelyn Lamer, MD Former Cardiology Providers: Dr. Yates Decamp Primary Cardiologist: Tessa Lerner, DO (established care 02/08/2020)   Date: 10/06/20 Last Office Visit: 03/21/2020  Chief Complaint  Patient presents with  . Follow-up    6 month   HPI  Eric Cohen is a 64 y.o.  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  Patient has established history of coronary disease underwent left heart catheterization in November 2020 and was noted to have single-vessel obstructive CAD involving the OM 2 distribution.  However, the vessel is small in caliber and therefore did not undergo intervention.  Since then patient has been treated medically until recently when he presented to the office back in April 2021 he was complaining of symptoms suggestive of angina pectoris requiring sublingual nitroglycerin tablets.  Due to the changes symptoms he underwent Lexiscan which noted normal perfusion and overall low risk study.  He now presents for 36-month follow-up.  Since last office visit patient states that his chest pain is chronic and stable.  He has episodes of intermittent chest pain, times a year for which he requires 1 tablet of sublingual nitroglycerin to relieve his symptoms.  He is not an active chest pain during today's encounter.  He is overall euvolemic and not in congestive heart failure.  And since last 6 months he has not been hospitalized or seen in urgent care for cardiovascular symptoms.  Patient states that he recently had blood work done at the [facility].  I do not have that blood work for review during today's encounter.  We will request  records.  On December 27, 2019 and was diagnosed with left popliteal vein DVT which was new compared to prior study.  He is back on Xarelto.  He followed up with hematology at the [facility] hospital as this is his second DVT.   ALLERGIES: Allergies  Allergen Reactions  . Shellfish Allergy Anaphylaxis    "close my throat"  . Lisinopril Cough   MEDICATION LIST PRIOR TO VISIT: Current Outpatient Medications on File Prior to Visit  Medication Sig Dispense Refill  . acetaminophen (TYLENOL) 500 MG tablet Take 2 tablets (1,000 mg total) by mouth every 6 (six) hours as needed. 30 tablet 0  . albuterol (PROVENTIL HFA;VENTOLIN HFA) 108 (90 BASE) MCG/ACT inhaler Inhale 2 puffs into the lungs every 6 (six) hours as needed for wheezing.    Marland Kitchen albuterol (PROVENTIL) (2.5 MG/3ML) 0.083% nebulizer solution Take 2.5 mg by nebulization every 6 (six) hours as needed for wheezing or shortness of breath.    Marland Kitchen aspirin 81 MG EC tablet TAKE 1 TABLET BY MOUTH EVERY DAY 90 tablet 1  . atorvastatin (LIPITOR) 80 MG tablet TAKE 1 TABLET (80 MG TOTAL) BY MOUTH DAILY AT 6 PM. 90 tablet 2  . Budesonide-Formoterol Fumarate (SYMBICORT IN) Inhale 2 puffs into the lungs daily.     . clopidogrel (PLAVIX) 75 MG tablet TAKE 1 TABLET BY MOUTH EVERY DAY 90 tablet 1  . doxepin (SINEQUAN) 75 MG capsule Take 75 mg by mouth.    . empagliflozin (JARDIANCE) 10 MG TABS tablet Take 10 mg by mouth daily before breakfast. 30 tablet 1  . ezetimibe (ZETIA) 10 MG tablet TAKE 1 TABLET  BY MOUTH EVERY DAY 90 tablet 0  . fluticasone (FLONASE) 50 MCG/ACT nasal spray Place into both nostrils daily.    Marland Kitchen. gabapentin (NEURONTIN) 300 MG capsule Take 300 mg by mouth daily. TID and 2 at bedtime    . insulin glargine (LANTUS) 100 UNIT/ML injection Inject 68 Units into the skin at bedtime.    . isosorbide mononitrate (IMDUR) 30 MG 24 hr tablet Take 1 tablet (30 mg total) by mouth daily. 90 tablet 0  . losartan-hydrochlorothiazide (HYZAAR) 100-25 MG tablet TAKE 1  TABLET BY MOUTH EVERY DAY 90 tablet 0  . metFORMIN (GLUCOPHAGE) 500 MG tablet TAKE 1 TABLET (500 MG TOTAL) BY MOUTH 2 (TWO) TIMES DAILY WITH A MEAL. 60 tablet 0  . metoprolol succinate (TOPROL-XL) 50 MG 24 hr tablet TAKE 1 TABLET BY MOUTH EVERY DAY 90 tablet 0  . montelukast (SINGULAIR) 4 MG chewable tablet Chew 4 mg by mouth at bedtime.    . prazosin (MINIPRESS) 5 MG capsule Take 5 mg by mouth at bedtime.    . Vitamin D, Ergocalciferol, (DRISDOL) 1.25 MG (50000 UT) CAPS capsule Take 50,000 Units by mouth every 7 (seven) days. New Rx to start taking on Sunday     No current facility-administered medications on file prior to visit.    PAST MEDICAL HISTORY: Past Medical History:  Diagnosis Date  . Asthma   . Coronary artery disease   . Depression   . Diabetes mellitus without complication (HCC)   . Diverticulitis   . DVT (deep venous thrombosis) (HCC)   . Heart disease   . Hyperlipidemia   . Hypertension     PAST SURGICAL HISTORY: Past Surgical History:  Procedure Laterality Date  . APPENDECTOMY    . CHOLECYSTECTOMY    . COLOSTOMY REVERSAL    . KNEE SURGERY    . LEFT HEART CATH AND CORONARY ANGIOGRAPHY N/A 08/31/2019   Procedure: LEFT HEART CATH AND CORONARY ANGIOGRAPHY;  Surgeon: Yates DecampGanji, Jay, MD;  Location: [facility] INVASIVE CV LAB;  Service: Cardiovascular;  Laterality: N/A;    FAMILY HISTORY: The patient's family history includes Diabetes in his father; Heart failure in his mother; Hyperlipidemia in his father.   SOCIAL HISTORY:  The patient  reports that he has never smoked. He has never used smokeless tobacco. He reports that he does not drink alcohol and does not use drugs.  Review of Systems  Constitutional: Negative for chills and fever.  HENT: Negative for ear discharge, ear pain and nosebleeds.   Eyes: Negative for blurred vision and discharge.  Cardiovascular: Negative for chest pain, claudication, dyspnea on exertion, leg swelling, near-syncope, orthopnea, palpitations,  paroxysmal nocturnal dyspnea and syncope.  Respiratory: Negative for cough and shortness of breath.   Endocrine: Negative for polydipsia, polyphagia and polyuria.  Hematologic/Lymphatic: Negative for bleeding problem.  Skin: Negative for flushing and nail changes.  Musculoskeletal: Negative for muscle cramps, muscle weakness and myalgias.  Gastrointestinal: Negative for abdominal pain, dysphagia, hematemesis, hematochezia, melena, nausea and vomiting.  Neurological: Negative for dizziness, focal weakness and light-headedness.    PHYSICAL EXAM: Vitals with BMI 10/06/2020 10/02/2020 10/02/2020  Height 5\' 11"  - -  Weight 244 lbs 6 oz - -  BMI 34.1 - -  Systolic 154 148 [id number]  Diastolic 86 91 85  Pulse 83 87 81   CONSTITUTIONAL: Well-developed and well-nourished. No acute distress.  Ambulates with a cane. SKIN: Skin is warm and dry. No rash noted. No cyanosis. No pallor. No jaundice HEAD: Normocephalic and atraumatic.  EYES:  No scleral icterus MOUTH/THROAT: Moist oral membranes.  NECK: No JVD present. No thyromegaly noted. No carotid bruits  LYMPHATIC: No visible cervical adenopathy.  CHEST Normal respiratory effort. No intercostal retractions  LUNGS: Clear to auscultation bilaterally.  No stridor. No wheezes. No rales.  CARDIOVASCULAR: Regular rate and rhythm, positive S1-S2, no murmurs rubs or gallops appreciated. ABDOMINAL: No apparent ascites.  EXTREMITIES: No peripheral edema  HEMATOLOGIC: No significant bruising NEUROLOGIC: Oriented to person, place, and time. Nonfocal. Normal muscle tone.  PSYCHIATRIC: Normal mood and affect. Normal behavior. Cooperative  CARDIAC DATABASE: EKG: 10/06/2020: Normal sinus rhythm, 81 bpm, without underlying injury pattern or ischemia,rsr pattern in V1.   Echocardiogram 08/21/2019: Left ventricle cavity is normal in size. Mild concentric hypertrophy of the left ventricle. Normal LV systolic function with visual EF 50-55%. Normal global wall motion.  Doppler evidence of grade I (impaired) diastolic dysfunction, normal LAP. Calculated EF 50%. Trileaflet aortic valve with mild aortic valve leaflet thickening. Mild (Grade I) aortic regurgitation. The IVC is not well visualized.  Stress Test: Lexiscan (Walking with Camelia Phenes) Sestamibi Stress Test 02/25/2020: Nondiagnostic ECG stress. Myocardial perfusion is normal. Overall LV systolic function is normal without regional wall motion abnormalities. Stress LV EF: 55%.  No previous exam available for comparison.  Left heart catheterization 08/31/2019: Right dominant circulation, no significant disease. Left main: Normal.  LAD: Mild diffuse disease, mid segment has a 20% stenosis.  Mild calcification is evident.  Circumflex: Mild disease in the proximal and mid segment.  OM 2 which is a small to moderate-sized vessel, has a acute angle with a 90% ostial stenosis. LV: Normal LVEDP, EF 55 to 65% without wall motion abnormality.  LABORATORY DATA: CBC Latest Ref Rng & Units 09/01/2019 08/31/2019 08/30/2019  WBC 4.0 - 10.5 K/uL 10.9(H) 11.0(H) 11.8(H)  Hemoglobin 13.0 - 17.0 g/dL 12.5(L) 13.4 13.6  Hematocrit 39.0 - 52.0 % 35.6(L) 38.7(L) 39.5  Platelets 150 - 400 K/uL 181 174 246    CMP Latest Ref Rng & Units 02/19/2020 02/07/2020 11/22/2019  Glucose 65 - 99 mg/dL 941(D) 408(X) 448(J)  BUN 8 - 27 mg/dL 15 85(U) 26  Creatinine 0.76 - 1.27 mg/dL 3.14 9.70(Y) 6.37  Sodium 134 - 144 mmol/L 137 136 142  Potassium 3.5 - 5.2 mmol/L 4.3 4.2 3.7  Chloride 96 - 106 mmol/L 97 94(L) 98  CO2 20 - 29 mmol/L 22 23 26   Calcium 8.6 - 10.2 mg/dL 9.6 9.7  Total Protein 6.0 - 8.5 g/dL - 8.3 -  Total Bilirubin 0.0 - 1.2 mg/dL - 1.0 -  Alkaline Phos 39 - 117 IU/L - 88 -  AST 0 - 40 IU/L - 36 -  ALT 0 - 44 IU/L - 34 -    Lipid Panel  Lab Results  Component Value Date   CHOL 122 02/07/2020   HDL 35 (L) 02/07/2020   LDLCALC 60 02/07/2020   LDLDIRECT 115 (H) 10/31/2019   TRIG 156 (H) 02/07/2020   CHOLHDL  5.0 08/31/2019   Lab Results  Component Value Date   HGBA1C 8.3 (H) 08/31/2019   No components found for: NTPROBNP Lab Results  Component Value Date   TSH 0.691 08/31/2019    Cardiac Panel (last 3 results) No results for input(s): CKTOTAL, CKMB, TROPONINIHS, RELINDX in the last 72 hours.  FINAL MEDICATION LIST END OF ENCOUNTER: Meds ordered this encounter  Medications  . nitroGLYCERIN (NITROSTAT) 0.4 MG SL tablet    Sig: Place 1 tablet (0.4 mg total) under the tongue  every 5 (five) minutes as needed for chest pain.    Dispense:  25 tablet    Refill:  3    Medications Discontinued During This Encounter  Medication Reason  . carbamazepine (TEGRETOL) 200 MG tablet Discontinued by provider  . hydrochlorothiazide (HYDRODIURIL) 25 MG tablet Change in therapy  . nitroGLYCERIN (NITROSTAT) 0.4 MG SL tablet Reorder     Current Outpatient Medications:  .  acetaminophen (TYLENOL) 500 MG tablet, Take 2 tablets (1,000 mg total) by mouth every 6 (six) hours as needed., Disp: 30 tablet, Rfl: 0 .  albuterol (PROVENTIL HFA;VENTOLIN HFA) 108 (90 BASE) MCG/ACT inhaler, Inhale 2 puffs into the lungs every 6 (six) hours as needed for wheezing., Disp: , Rfl:  .  albuterol (PROVENTIL) (2.5 MG/3ML) 0.083% nebulizer solution, Take 2.5 mg by nebulization every 6 (six) hours as needed for wheezing or shortness of breath., Disp: , Rfl:  .  aspirin 81 MG EC tablet, TAKE 1 TABLET BY MOUTH EVERY DAY, Disp: 90 tablet, Rfl: 1 .  atorvastatin (LIPITOR) 80 MG tablet, TAKE 1 TABLET (80 MG TOTAL) BY MOUTH DAILY AT 6 PM., Disp: 90 tablet, Rfl: 2 .  Budesonide-Formoterol Fumarate (SYMBICORT IN), Inhale 2 puffs into the lungs daily. , Disp: , Rfl:  .  clopidogrel (PLAVIX) 75 MG tablet, TAKE 1 TABLET BY MOUTH EVERY DAY, Disp: 90 tablet, Rfl: 1 .  doxepin (SINEQUAN) 75 MG capsule, Take 75 mg by mouth., Disp: , Rfl:  .  empagliflozin (JARDIANCE) 10 MG TABS tablet, Take 10 mg by mouth daily before breakfast., Disp: 30  tablet, Rfl: 1 .  ezetimibe (ZETIA) 10 MG tablet, TAKE 1 TABLET BY MOUTH EVERY DAY, Disp: 90 tablet, Rfl: 0 .  fluticasone (FLONASE) 50 MCG/ACT nasal spray, Place into both nostrils daily., Disp: , Rfl:  .  gabapentin (NEURONTIN) 300 MG capsule, Take 300 mg by mouth daily. TID and 2 at bedtime, Disp: , Rfl:  .  insulin glargine (LANTUS) 100 UNIT/ML injection, Inject 68 Units into the skin at bedtime., Disp: , Rfl:  .  isosorbide mononitrate (IMDUR) 30 MG 24 hr tablet, Take 1 tablet (30 mg total) by mouth daily., Disp: 90 tablet, Rfl: 0 .  losartan-hydrochlorothiazide (HYZAAR) 100-25 MG tablet, TAKE 1 TABLET BY MOUTH EVERY DAY, Disp: 90 tablet, Rfl: 0 .  metFORMIN (GLUCOPHAGE) 500 MG tablet, TAKE 1 TABLET (500 MG TOTAL) BY MOUTH 2 (TWO) TIMES DAILY WITH A MEAL., Disp: 60 tablet, Rfl: 0 .  metoprolol succinate (TOPROL-XL) 50 MG 24 hr tablet, TAKE 1 TABLET BY MOUTH EVERY DAY, Disp: 90 tablet, Rfl: 0 .  montelukast (SINGULAIR) 4 MG chewable tablet, Chew 4 mg by mouth at bedtime., Disp: , Rfl:  .  prazosin (MINIPRESS) 5 MG capsule, Take 5 mg by mouth at bedtime., Disp: , Rfl:  .  Vitamin D, Ergocalciferol, (DRISDOL) 1.25 MG (50000 UT) CAPS capsule, Take 50,000 Units by mouth every 7 (seven) days. New Rx to start taking on Sunday, Disp: , Rfl:  .  nitroGLYCERIN (NITROSTAT) 0.4 MG SL tablet, Place 1 tablet (0.4 mg total) under the tongue every 5 (five) minutes as needed for chest pain., Disp: 25 tablet, Rfl: 3  IMPRESSION:    ICD-10-CM   1. Atherosclerosis of native coronary artery of native heart with stable angina pectoris (HCC)  I25.118 EKG 12-Lead    nitroGLYCERIN (NITROSTAT) 0.4 MG SL tablet  2. Type 2 diabetes mellitus without complication, with long-term current use of insulin (HCC)  E11.9    Z79.4  3. Long-term insulin use (HCC)  Z79.4   4. Obstructive sleep apnea syndrome  G47.33   5. Essential hypertension  I10   6. Mixed hyperlipidemia  E78.2   7. History of DVT (deep vein thrombosis)   Z86.718   8. Class 1 obesity due to excess calories with serious comorbidity and body mass index (BMI) of 34.0 to 34.9 in adult  E66.09    Z68.34      RECOMMENDATIONS: Eric Cohen is a 64 y.o. 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  Atherosclerosis of the native coronary arteries with stable angina pectoris:  EKG shows normal sinus rhythm without underlying injury pattern.  Patient has episodic events of intermittent chest discomfort which are resolved with 1 sublingual nitroglycerin tablet.  Patient's stable angina pectoris has not worsened in intensity, frequency, and/or duration.   Sublingual nitroglycerin tablets refilled.    No additional cardiac work-up needed at this time.  We discussed uptitrating antianginal therapy; however, patient again forgot to bring his medication bottles in for accurate medication reconciliation.  He does not recall the dosages of the medication that he takes on a regular basis.  He is asked to call the office once he gets home so that medications can be updated accurately.  Benign essential hypertension:  Office blood pressure is currently not at goal.  Patient states that his home blood pressures are better controlled.  However, he does not recall the numbers.  I have asked him to keep a log of his blood pressures and to call his PCP or [place] if his systolic blood pressures are consistently greater than 135 mmHg.  He verbalized understanding.  Low-salt diet recommended.  Mixed hyperlipidemia:  Since last office visit patient states that his lipids were rechecked at the [facility] hospital.  We will request records.  Continue statin therapy and Zetia for now.  Recently diagnosed DVT:  Patient states that he is following up with [place] vein specialist for the  management of his left lower extremity DVT in the popliteal vein.  He is also seeing hematology since this is his second DVT.  Will defer management to vein specialist and hematology.  Obesity, due to excess calories: Body mass index is 34.09 kg/m. . I reviewed with the patient the importance of diet, regular physical activity/exercise, weight loss.   . Patient is educated on increasing physical activity gradually as tolerated.  With the goal of moderate intensity exercise for 30 minutes a day 5 days a week.  Orders Placed This Encounter  Procedures  . EKG 12-Lead   --Continue cardiac medications as reconciled in final medication list. --Return in about 6 months (around 04/06/2021) for Follow up, CAD, Chest pain. Or sooner if needed. --Continue follow-up with your primary care physician regarding the management of your other chronic comorbid conditions.  Patient's questions and concerns were addressed to his satisfaction. He voices understanding of the instructions provided during this encounter.   This note was created using a voice recognition software as a result there may be grammatical errors inadvertently enclosed that do not reflect the nature of this encounter. Every attempt is made to correct such errors.  Tessa Lerner, [place], [facility]  Pager: [phone] Office: [phone]

## 2020-10-30 ENCOUNTER — Other Ambulatory Visit: Payer: Self-pay | Admitting: Cardiology

## 2020-10-30 DIAGNOSIS — I10 Essential (primary) hypertension: Secondary | ICD-10-CM

## 2020-10-30 DIAGNOSIS — E782 Mixed hyperlipidemia: Secondary | ICD-10-CM

## 2020-12-06 ENCOUNTER — Encounter (HOSPITAL_BASED_OUTPATIENT_CLINIC_OR_DEPARTMENT_OTHER): Payer: Self-pay | Admitting: Emergency Medicine

## 2020-12-06 ENCOUNTER — Emergency Department (HOSPITAL_BASED_OUTPATIENT_CLINIC_OR_DEPARTMENT_OTHER): Payer: 59

## 2020-12-06 ENCOUNTER — Other Ambulatory Visit: Payer: Self-pay

## 2020-12-06 ENCOUNTER — Observation Stay (HOSPITAL_BASED_OUTPATIENT_CLINIC_OR_DEPARTMENT_OTHER)
Admission: EM | Admit: 2020-12-06 | Discharge: 2020-12-08 | Disposition: A | Discharge status: Home / Self Care | Payer: 59 | Attending: Emergency Medicine | Admitting: Emergency Medicine

## 2020-12-06 DIAGNOSIS — I10 Essential (primary) hypertension: Secondary | ICD-10-CM | POA: Diagnosis not present

## 2020-12-06 DIAGNOSIS — G4700 Insomnia, unspecified: Secondary | ICD-10-CM | POA: Diagnosis not present

## 2020-12-06 DIAGNOSIS — I2510 Atherosclerotic heart disease of native coronary artery without angina pectoris: Secondary | ICD-10-CM

## 2020-12-06 DIAGNOSIS — E1165 Type 2 diabetes mellitus with hyperglycemia: Secondary | ICD-10-CM

## 2020-12-06 DIAGNOSIS — Z79899 Other long term (current) drug therapy: Secondary | ICD-10-CM | POA: Insufficient documentation

## 2020-12-06 DIAGNOSIS — Z794 Long term (current) use of insulin: Secondary | ICD-10-CM | POA: Insufficient documentation

## 2020-12-06 DIAGNOSIS — Z7901 Long term (current) use of anticoagulants: Secondary | ICD-10-CM | POA: Diagnosis not present

## 2020-12-06 DIAGNOSIS — Z86718 Personal history of other venous thrombosis and embolism: Secondary | ICD-10-CM | POA: Diagnosis not present

## 2020-12-06 DIAGNOSIS — E785 Hyperlipidemia, unspecified: Secondary | ICD-10-CM | POA: Diagnosis not present

## 2020-12-06 DIAGNOSIS — Z20822 Contact with and (suspected) exposure to covid-19: Secondary | ICD-10-CM | POA: Insufficient documentation

## 2020-12-06 DIAGNOSIS — N179 Acute kidney failure, unspecified: Secondary | ICD-10-CM | POA: Diagnosis not present

## 2020-12-06 DIAGNOSIS — Z791 Long term (current) use of non-steroidal anti-inflammatories (NSAID): Secondary | ICD-10-CM | POA: Insufficient documentation

## 2020-12-06 DIAGNOSIS — R55 Syncope and collapse: Secondary | ICD-10-CM | POA: Diagnosis present

## 2020-12-06 DIAGNOSIS — R519 Headache, unspecified: Secondary | ICD-10-CM

## 2020-12-06 DIAGNOSIS — E119 Type 2 diabetes mellitus without complications: Secondary | ICD-10-CM

## 2020-12-06 LAB — CBG MONITORING, ED
Glucose-Capillary: 499 mg/dL — ABNORMAL HIGH (ref 70–99)
Glucose-Capillary: 362 mg/dL — ABNORMAL HIGH (ref 70–99)
Glucose-Capillary: 325 mg/dL — ABNORMAL HIGH (ref 70–99)

## 2020-12-06 LAB — CBC
HCT: 35.6 % — ABNORMAL LOW (ref 39.0–52.0)
Hemoglobin: 12.7 g/dL — ABNORMAL LOW (ref 13.0–17.0)
MCH: 30.1 pg (ref 26.0–34.0)
MCHC: 35.7 g/dL (ref 30.0–36.0)
MCV: 84.4 fL (ref 80.0–100.0)
Platelets: 235 10*3/uL (ref 150–400)
RBC: 4.22 MIL/uL (ref 4.22–5.81)
RDW: 17.2 % — ABNORMAL HIGH (ref 11.5–15.5)
WBC: 8.8 10*3/uL (ref 4.0–10.5)
nRBC: 0 % (ref 0.0–0.2)

## 2020-12-06 LAB — URINALYSIS, MICROSCOPIC (REFLEX)

## 2020-12-06 LAB — URINALYSIS, ROUTINE W REFLEX MICROSCOPIC
Bilirubin Urine: NEGATIVE
Glucose, UA: >=500 mg/dL — AB
Hgb urine dipstick: NEGATIVE
Ketones, ur: NEGATIVE mg/dL
Leukocytes,Ua: NEGATIVE
Nitrite: NEGATIVE
Protein, ur: NEGATIVE mg/dL
Specific Gravity, Urine: 1.01 (ref 1.005–1.030)
pH: 5 (ref 5.0–8.0)

## 2020-12-06 LAB — BASIC METABOLIC PANEL
Anion gap: 11 (ref 5–15)
BUN: 43 mg/dL — ABNORMAL HIGH (ref 8–23)
CO2: 25 mmol/L (ref 22–32)
Calcium: 8.7 mg/dL — ABNORMAL LOW (ref 8.9–10.3)
Chloride: 95 mmol/L — ABNORMAL LOW (ref 98–111)
Creatinine, Ser: 2.28 mg/dL — ABNORMAL HIGH (ref 0.61–1.24)
GFR, Estimated: 31 mL/min — ABNORMAL LOW (ref >60)
Glucose, Bld: 530 mg/dL (ref 70–99)
Potassium: 4.5 mmol/L (ref 3.5–5.1)
Sodium: 131 mmol/L — ABNORMAL LOW (ref 135–145)

## 2020-12-06 LAB — SARS CORONAVIRUS 2 BY RT PCR (HOSPITAL ORDER, PERFORMED IN ~~LOC~~ HOSPITAL LAB): SARS Coronavirus 2: NEGATIVE

## 2020-12-06 LAB — GLUCOSE, CAPILLARY: Glucose-Capillary: 379 mg/dL — ABNORMAL HIGH (ref 70–99)

## 2020-12-06 MED ORDER — ONDANSETRON HCL 4 MG/2ML IJ SOLN: 4.0000 mg | Freq: Four times a day (QID) | INTRAMUSCULAR | Status: DC | PRN

## 2020-12-06 MED ORDER — INSULIN ASPART 100 UNIT/ML ~~LOC~~ SOLN
4.0000 [IU] | Freq: Once | SUBCUTANEOUS | Status: AC
  Administered 2020-12-06: 4 [IU] via INTRAVENOUS
  Filled 2020-12-06: qty 4

## 2020-12-06 MED ORDER — PROCHLORPERAZINE EDISYLATE 10 MG/2ML IJ SOLN
10.0000 mg | Freq: Once | INTRAMUSCULAR | Status: AC
  Administered 2020-12-06: 10 mg via INTRAVENOUS
  Filled 2020-12-06: qty 2

## 2020-12-06 MED ORDER — POLYETHYLENE GLYCOL 3350 17 G PO PACK: 17.0000 g | PACK | Freq: Every day | ORAL | Status: DC | PRN

## 2020-12-06 MED ORDER — GABAPENTIN 300 MG PO CAPS
600.0000 mg | ORAL_CAPSULE | Freq: Every day | ORAL | Status: DC
  Administered 2020-12-07 (×2): 600 mg via ORAL
  Filled 2020-12-06 (×2): qty 2

## 2020-12-06 MED ORDER — MONTELUKAST SODIUM 10 MG PO TABS
10.0000 mg | ORAL_TABLET | Freq: Every morning | ORAL | Status: DC
  Administered 2020-12-07 – 2020-12-08 (×2): 10 mg via ORAL
  Filled 2020-12-06 (×2): qty 1

## 2020-12-06 MED ORDER — ATORVASTATIN CALCIUM 80 MG PO TABS
80.0000 mg | ORAL_TABLET | Freq: Every day | ORAL | Status: DC
  Administered 2020-12-07: 80 mg via ORAL
  Filled 2020-12-06 (×2): qty 1

## 2020-12-06 MED ORDER — ACETAMINOPHEN 325 MG PO TABS
650.0000 mg | ORAL_TABLET | Freq: Four times a day (QID) | ORAL | Status: DC | PRN
  Administered 2020-12-06 – 2020-12-07 (×2): 650 mg via ORAL
  Filled 2020-12-06 (×2): qty 2

## 2020-12-06 MED ORDER — ONDANSETRON HCL 4 MG PO TABS: 4.0000 mg | ORAL_TABLET | Freq: Four times a day (QID) | ORAL | Status: DC | PRN

## 2020-12-06 MED ORDER — METOPROLOL TARTRATE 50 MG PO TABS
50.0000 mg | ORAL_TABLET | Freq: Two times a day (BID) | ORAL | Status: DC
  Administered 2020-12-07 – 2020-12-08 (×3): 50 mg via ORAL
  Filled 2020-12-06 (×3): qty 1

## 2020-12-06 MED ORDER — INSULIN GLARGINE 100 UNIT/ML ~~LOC~~ SOLN
65.0000 [IU] | Freq: Every day | SUBCUTANEOUS | Status: DC
  Administered 2020-12-06 – 2020-12-07 (×2): 65 [IU] via SUBCUTANEOUS
  Filled 2020-12-06 (×3): qty 0.65

## 2020-12-06 MED ORDER — INSULIN ASPART 100 UNIT/ML ~~LOC~~ SOLN
0.0000 [IU] | Freq: Three times a day (TID) | SUBCUTANEOUS | Status: DC
  Administered 2020-12-07 (×3): 8 [IU] via SUBCUTANEOUS
  Administered 2020-12-08: 2 [IU] via SUBCUTANEOUS

## 2020-12-06 MED ORDER — CARBAMAZEPINE 200 MG PO TABS
200.0000 mg | ORAL_TABLET | Freq: Every day | ORAL | Status: DC
  Administered 2020-12-07 (×2): 200 mg via ORAL
  Filled 2020-12-06 (×3): qty 1

## 2020-12-06 MED ORDER — DIPHENHYDRAMINE HCL 50 MG/ML IJ SOLN
25.0000 mg | Freq: Once | INTRAMUSCULAR | Status: AC
  Administered 2020-12-06: 25 mg via INTRAVENOUS
  Filled 2020-12-06: qty 1

## 2020-12-06 MED ORDER — RIVAROXABAN 20 MG PO TABS
20.0000 mg | ORAL_TABLET | Freq: Every morning | ORAL | Status: DC
  Administered 2020-12-07 – 2020-12-08 (×2): 20 mg via ORAL
  Filled 2020-12-06 (×2): qty 1

## 2020-12-06 MED ORDER — ENOXAPARIN SODIUM 40 MG/0.4ML ~~LOC~~ SOLN
40.0000 mg | SUBCUTANEOUS | Status: DC
  Filled 2020-12-06: qty 0.4

## 2020-12-06 MED ORDER — ASPIRIN EC 81 MG PO TBEC
81.0000 mg | DELAYED_RELEASE_TABLET | Freq: Every day | ORAL | Status: DC
  Administered 2020-12-07 – 2020-12-08 (×2): 81 mg via ORAL
  Filled 2020-12-06 (×2): qty 1

## 2020-12-06 MED ORDER — ACETAMINOPHEN 650 MG RE SUPP: 650.0000 mg | Freq: Four times a day (QID) | RECTAL | Status: DC | PRN

## 2020-12-06 MED ORDER — LACTATED RINGERS IV SOLN: INTRAVENOUS | Status: DC

## 2020-12-06 MED ORDER — INSULIN ASPART 100 UNIT/ML ~~LOC~~ SOLN
0.0000 [IU] | Freq: Every day | SUBCUTANEOUS | Status: DC
  Administered 2020-12-06: 5 [IU] via SUBCUTANEOUS
  Administered 2020-12-07: 3 [IU] via SUBCUTANEOUS

## 2020-12-06 MED ORDER — ALBUTEROL SULFATE HFA 108 (90 BASE) MCG/ACT IN AERS: 2.0000 | INHALATION_SPRAY | Freq: Four times a day (QID) | RESPIRATORY_TRACT | Status: DC | PRN

## 2020-12-06 MED ORDER — OXYCODONE HCL 5 MG PO TABS: 5.0000 mg | ORAL_TABLET | ORAL | Status: DC | PRN

## 2020-12-06 MED ORDER — ESCITALOPRAM OXALATE 10 MG PO TABS
10.0000 mg | ORAL_TABLET | Freq: Every morning | ORAL | Status: DC
  Administered 2020-12-07 – 2020-12-08 (×2): 10 mg via ORAL
  Filled 2020-12-06 (×2): qty 1

## 2020-12-06 MED ORDER — FLUTICASONE PROPIONATE 50 MCG/ACT NA SUSP: 1.0000 | Freq: Every day | NASAL | Status: DC | PRN

## 2020-12-06 MED ORDER — EZETIMIBE 10 MG PO TABS
10.0000 mg | ORAL_TABLET | Freq: Every day | ORAL | Status: DC
  Administered 2020-12-07 – 2020-12-08 (×2): 10 mg via ORAL
  Filled 2020-12-06 (×2): qty 1

## 2020-12-06 MED ORDER — SODIUM CHLORIDE 0.9 % IV BOLUS
1000.0000 mL | Freq: Once | INTRAVENOUS | Status: AC
  Administered 2020-12-06: 1000 mL via INTRAVENOUS

## 2020-12-06 MED ORDER — ZOLPIDEM TARTRATE 5 MG PO TABS: 10.0000 mg | ORAL_TABLET | Freq: Every evening | ORAL | Status: DC | PRN

## 2020-12-06 MED ORDER — SODIUM CHLORIDE 0.9% FLUSH
3.0000 mL | Freq: Two times a day (BID) | INTRAVENOUS | Status: DC
  Administered 2020-12-06 – 2020-12-08 (×2): 3 mL via INTRAVENOUS

## 2020-12-06 NOTE — ED Notes (Signed)
ED Provider at bedside. [place], PA 

## 2020-12-06 NOTE — ED Triage Notes (Signed)
Pt arrives pov, ambulatory to triage with report of syncope episode on Thursday. Pt endorses HA and dizziness after fall. Pt woke up on floor, with unknown time unconscious. Pt hit in head Friday and treated for concussion

## 2020-12-06 NOTE — ED Notes (Signed)
Patient transported to CT 

## 2020-12-06 NOTE — ED Notes (Signed)
Patient transported to MRI 

## 2020-12-06 NOTE — ED Provider Notes (Signed)
[facility] EMERGENCY DEPARTMENT Provider Note   CSN: [id number] Arrival date & time: 12/06/20  1151     History Chief Complaint  Patient presents with  . Fall  . Loss of Consciousness    Eric Cohen is a 65 y.o. male possible history of CAD, diabetes, DVT, hyperlipidemia, hypertension who presents for evaluation of headache, feeling off balance, syncopal episode.  He reports that about a week ago, he was working and got hit in the head with a bungee cord.  He reports that he hit him on the right side of his head.  He states that he had a headache and saw his primary care doctor 3 days later.  He states that he had a reassuring exam and was discharged home.  He reports that he still continued to have a headache.  He reports Thursday night about 2 nights ago, he woke up in the middle night evaluated in the bathroom.  He states that he remembers trying to get out of bed and then states the next thing he remembers was that he was on the floor and his wife was waking him up.  He did not seek evaluation at the time.  He reports that since that time, he has had worsening pain in the right side of his head as well as feeling like he is off balance.  He feels like he sways to the side whenever he walks.  He has had to use a cane which he states is abnormal for him.  He has not had any vision changes, blurry vision, double vision.  Denies any nausea/vomiting.  Denies any numbness/weakness.  He has not had any chest pain, difficulty breathing, abdominal pain.  He is on baby aspirin.  No other blood thinner use.  He denies any neck pain.  The history is provided by the patient.       Past Medical History:  Diagnosis Date  . Asthma   . Coronary artery disease   . Depression   . Diabetes mellitus without complication (HCC)   . Diverticulitis   . DVT (deep venous thrombosis) (HCC)   . Heart disease   . Hyperlipidemia   . Hypertension     Patient Active Problem List   Diagnosis Date  Noted  . Syncope and collapse 12/06/2020  . Unstable angina (HCC) 08/30/2019    Past Surgical History:  Procedure Laterality Date  . APPENDECTOMY    . CHOLECYSTECTOMY    . COLOSTOMY REVERSAL    . KNEE SURGERY    . LEFT HEART CATH AND CORONARY ANGIOGRAPHY N/A 08/31/2019   Procedure: LEFT HEART CATH AND CORONARY ANGIOGRAPHY;  Surgeon: Yates Decamp, MD;  Location: [facility] INVASIVE CV LAB;  Service: Cardiovascular;  Laterality: N/A;       Family History  Problem Relation Age of Onset  . Heart failure Mother   . Diabetes Father   . Hyperlipidemia Father     Social History   Tobacco Use  . Smoking status: Never Smoker  . Smokeless tobacco: Never Used  Vaping Use  . Vaping Use: Never used  Substance Use Topics  . Alcohol use: No  . Drug use: No    Home Medications Prior to Admission medications   Medication Sig Start Date End Date Taking? Authorizing Provider  acetaminophen (TYLENOL) 500 MG tablet Take 2 tablets (1,000 mg total) by mouth every 6 (six) hours as needed. 01/19/20   Little, Ambrose Finland, MD  albuterol (PROVENTIL HFA;VENTOLIN HFA) 108 (90 BASE)  MCG/ACT inhaler Inhale 2 puffs into the lungs every 6 (six) hours as needed for wheezing.    [provider]  albuterol (PROVENTIL) (2.5 MG/3ML) 0.083% nebulizer solution Take 2.5 mg by nebulization every 6 (six) hours as needed for wheezing or shortness of breath.    [provider]  aspirin 81 MG EC tablet TAKE 1 TABLET BY MOUTH EVERY DAY 09/06/19   Toniann Fail, NP  atorvastatin (LIPITOR) 80 MG tablet TAKE 1 TABLET (80 MG TOTAL) BY MOUTH DAILY AT 6 PM. 02/20/20   Yates Decamp, MD  Budesonide-Formoterol Fumarate (SYMBICORT IN) Inhale 2 puffs into the lungs daily.     [provider]  clopidogrel (PLAVIX) 75 MG tablet TAKE 1 TABLET BY MOUTH EVERY DAY 09/01/20   Tolia, Sunit, DO  doxepin (SINEQUAN) 75 MG capsule Take 75 mg by mouth.    [provider]  empagliflozin (JARDIANCE) 10 MG TABS  tablet Take 10 mg by mouth daily before breakfast. 11/15/19   Toniann Fail, NP  ezetimibe (ZETIA) 10 MG tablet TAKE 1 TABLET BY MOUTH EVERY DAY 10/31/20   Tolia, Sunit, DO  fluticasone (FLONASE) 50 MCG/ACT nasal spray Place into both nostrils daily.    [provider]  gabapentin (NEURONTIN) 300 MG capsule Take 300 mg by mouth daily. TID and 2 at bedtime    [provider]  insulin glargine (LANTUS) 100 UNIT/ML injection Inject 68 Units into the skin at bedtime.    [provider]  isosorbide mononitrate (IMDUR) 30 MG 24 hr tablet Take 1 tablet (30 mg total) by mouth daily. 02/08/20 05/08/20  Tolia, Sunit, DO  losartan-hydrochlorothiazide (HYZAAR) 100-25 MG tablet TAKE 1 TABLET BY MOUTH EVERY DAY 10/31/20   Tolia, Sunit, DO  metFORMIN (GLUCOPHAGE) 500 MG tablet TAKE 1 TABLET (500 MG TOTAL) BY MOUTH 2 (TWO) TIMES DAILY WITH A MEAL. 12/21/19   Toniann Fail, NP  metoprolol succinate (TOPROL-XL) 50 MG 24 hr tablet TAKE 1 TABLET BY MOUTH EVERY DAY 12/26/19   Toniann Fail, NP  montelukast (SINGULAIR) 4 MG chewable tablet Chew 4 mg by mouth at bedtime.    [provider]  nitroGLYCERIN (NITROSTAT) 0.4 MG SL tablet Place 1 tablet (0.4 mg total) under the tongue every 5 (five) minutes as needed for chest pain. 10/06/20 01/04/21  Tolia, Sunit, DO  prazosin (MINIPRESS) 5 MG capsule Take 5 mg by mouth at bedtime.    [provider]  Vitamin D, Ergocalciferol, (DRISDOL) 1.25 MG (50000 UT) CAPS capsule Take 50,000 Units by mouth every 7 (seven) days. New Rx to start taking on Sunday    [provider]    Allergies    Shellfish allergy and Lisinopril  Review of Systems   Review of Systems  Constitutional: Negative for fever.  Respiratory: Negative for cough and shortness of breath.   Cardiovascular: Negative for chest pain.  Gastrointestinal: Negative for abdominal pain, nausea and vomiting.  Genitourinary: Negative for dysuria and  hematuria.  Musculoskeletal: Positive for gait problem.  Neurological: Positive for syncope and headaches.  All other systems reviewed and are negative.   Physical Exam Updated Vital Signs BP (!) 151/84   Pulse 79   Temp 98 F (36.7 C) (Oral)   Resp 16   Ht 5\' 11"  (1.803 m)   Wt 108.9 kg   SpO2 97%   BMI 33.47 kg/m   Physical Exam Vitals and nursing note reviewed.  Constitutional:      Appearance: Normal appearance. He is  well-developed and well-nourished.  HENT:     Head: Normocephalic and atraumatic.     Comments: No tenderness to palpation of skull. No deformities or crepitus noted. No open wounds, abrasions or lacerations.     Mouth/Throat:     Mouth: Oropharynx is clear and moist and mucous membranes are normal.  Eyes:     General: Lids are normal.     Extraocular Movements: EOM normal.     Conjunctiva/sclera: Conjunctivae normal.     Pupils: Pupils are equal, round, and reactive to light.     Comments: PERRL. EOMs intact. No nystagmus. No neglect.   Cardiovascular:     Rate and Rhythm: Normal rate and regular rhythm.     Pulses: Normal pulses.     Heart sounds: Normal heart sounds. No murmur heard. No friction rub. No gallop.   Pulmonary:     Effort: Pulmonary effort is normal.     Breath sounds: Normal breath sounds.     Comments: Lungs clear to auscultation bilaterally.  Symmetric chest rise.  No wheezing, rales, rhonchi. Abdominal:     Palpations: Abdomen is soft. Abdomen is not rigid.     Tenderness: There is no abdominal tenderness. There is no guarding.  Musculoskeletal:        General: Normal range of motion.     Cervical back: Full passive range of motion without pain.  Skin:    General: Skin is warm and dry.     Capillary Refill: Capillary refill takes less than 2 seconds.  Neurological:     Mental Status: He is alert and oriented to person, place, and time.     Comments: Cranial nerves III-XII intact Follows commands, Moves all extremities  5/5  strength to BUE and BLE  Sensation intact throughout all major nerve distributions Abnormal finger to nose on the right.  No dysdiadochokinesia. No pronator drift. No slurred speech. No facial droop.   Psychiatric:        Mood and Affect: Mood and affect normal.        Speech: Speech normal.     ED Results / Procedures / Treatments   Labs (all labs ordered are listed, but only abnormal results are displayed) Labs Reviewed  BASIC METABOLIC PANEL - Abnormal; Notable for the following components:      Result Value   Sodium 131 (*)    Chloride 95 (*)    Glucose, Bld 530 (*)    BUN 43 (*)    Creatinine, Ser 2.28 (*)    Calcium 8.7 (*)    GFR, Estimated 31 (*)    All other components within normal limits  CBC - Abnormal; Notable for the following components:   Hemoglobin 12.7 (*)    HCT 35.6 (*)    RDW 17.2 (*)    All other components within normal limits  URINALYSIS, ROUTINE W REFLEX MICROSCOPIC - Abnormal; Notable for the following components:   Color, Urine STRAW (*)    Glucose, UA >=500 (*)    All other components within normal limits  URINALYSIS, MICROSCOPIC (REFLEX) - Abnormal; Notable for the following components:   Bacteria, UA FEW (*)    All other components within normal limits  CBG MONITORING, ED - Abnormal; Notable for the following components:   Glucose-Capillary 499 (*)    All other components within normal limits  CBG MONITORING, ED - Abnormal; Notable for the following components:   Glucose-Capillary 362 (*)    All other components within normal limits  EKG EKG Interpretation  Date/Time:  Saturday December 06 2020 11:55:22 EST Ventricular Rate:  88 PR Interval:  196 QRS Duration: 78 QT Interval:  372 QTC Calculation: 450 R Axis:   38 Text Interpretation: Normal sinus rhythm Normal ECG No significant change since last tracing Confirmed by Alvira Monday ([id number]) on 12/06/2020 1:27:20 PM   Radiology CT Head Wo Contrast  Result Date:  12/06/2020 CLINICAL DATA:  Hit head against solid object.  Dizziness. EXAM: CT HEAD WITHOUT CONTRAST TECHNIQUE: Contiguous axial images were obtained from the base of the skull through the vertex without intravenous contrast. COMPARISON:  November 11, 2006. FINDINGS: Brain: Ventricles and sulci are normal in size and configuration. There is no intracranial mass, hemorrhage, extra-axial fluid collection, or midline shift. The brain parenchyma appears unremarkable. There is no appreciable acute infarct. Vascular: No hyperdense vessel. There are foci of calcification in the left carotid siphon. Skull: Bony calvarium appears intact. Sinuses/Orbits: There is opacification of a mid left ethmoid air cell. Mucosal thickening noted in several ethmoid air cells. Orbits appear symmetric bilaterally. Other: Mastoid air cells are clear. There is debris in the right external auditory canal. IMPRESSION: Brain parenchyma appears unremarkable.  No mass or hemorrhage. Left carotid siphon region calcification noted. Areas of paranasal sinus disease noted. Probable cerumen in the right external auditory canal. Electronically Signed   By: Bretta Bang III M.D.   On: 12/06/2020 14:14   MR BRAIN WO CONTRAST  Result Date: 12/06/2020 CLINICAL DATA:  Difficulty walking. Additional history provided: Headaches, dizziness since Thursday. EXAM: MRI HEAD WITHOUT CONTRAST TECHNIQUE: Multiplanar, multiecho pulse sequences of the brain and surrounding structures were obtained without intravenous contrast. COMPARISON:  Head CT 12/06/2020.  Head CT 04/24/2006. FINDINGS: Brain: Mild intermittent motion degradation. Cerebral volume is normal for age. Mild multifocal T2/FLAIR hyperintensity within the cerebral white matter is nonspecific, but compatible with chronic small vessel ischemic disease. There is no acute infarct. No evidence of intracranial mass. No chronic intracranial blood products. No extra-axial fluid collection. No midline shift.  Vascular: Expected proximal arterial flow voids. Skull and upper cervical spine: No focal marrow lesion. Sinuses/Orbits: Visualized orbits show no acute finding. Mild partial T2 hyperintense opacification of the left ethmoid sinus. Small right maxillary sinus mucous retention cyst. IMPRESSION: Mildly motion degraded exam. No evidence of acute intracranial abnormality, including acute infarction. Mild cerebral white matter chronic small vessel ischemic disease. Mild left ethmoid sinusitis. Small right maxillary sinus mucous retention cyst. Electronically Signed   By: Jackey Loge DO   On: 12/06/2020 15:54    Procedures Procedures   Medications Ordered in ED Medications  sodium chloride 0.9 % bolus 1,000 mL (0 mLs Intravenous Stopped 12/06/20 1559)  insulin aspart (novoLOG) injection 4 Units (4 Units Intravenous Given 12/06/20 1349)  prochlorperazine (COMPAZINE) injection 10 mg (10 mg Intravenous Given 12/06/20 1807)  diphenhydrAMINE (BENADRYL) injection 25 mg (25 mg Intravenous Given 12/06/20 1806)    ED Course  I have reviewed the triage vital signs and the nursing notes.  Pertinent labs & imaging results that were available during my care of the patient were reviewed by me and considered in my medical decision making (see chart for details).    MDM Rules/Calculators/A&P                          65 year old male who presents for evaluation of headache, difficulty walking.  Reports he was hit in the head about a week ago.  He  also reports a syncopal episode that occurred about 2 days ago.  He is on baby aspirin.  No other blood thinners.  On initial arrival, he is afebrile nontoxic-appearing.  Vital signs are stable.  On exam, he has no weakness noted but has an abnormal finger-to-nose on the right.  He reports that he has difficulty walking and will deviate.  Concern for intracranial hemorrhage, CVA.  Plan to check labs, CT head.  UA negative for any infectious etiology.  CBC shows hemoglobin  12.7.  No leukocytosis.  BMP shows glucose of 530, BUN is 43, creatinine of 2.28 which is abnormal for him.  His last creatinine was 9 months ago and was 1.02.  He has had 1 prior episode where he has been 2.34 but his consistent creatinine is around 1.  CT head is unremarkable.  He has left carotid siphon region calcification noted as well as areas of paraspinal and nasal disease.  Given that he has had some difficulty walking, will plan for MRI for further evaluation.  MRI shows no evidence of acute intracranial normality, including acute infarction.  He has chronic small vessel ischemic disease.  At this time, he is hemodynamic stable.  His blood sugars improved he does have an AKI.  Unclear etiology of his syncopal episode.  He did not have any prodrome.  I reviewed his records and showed he had a cath in 2020 that did show some disease.  His stress test in April 2021 was within normal limits.  He has not had an echo in about 2 years.  At this time, given his unclear etiology of his syncope, recommend observation admission with telemetry, echo.  Discussed with Dr. Mikeal Hawthorne (hospitalist) who accepts patient for admission.   Portions of this note were generated with Scientist, clinical (histocompatibility and immunogenetics). Dictation errors may occur despite best attempts at proofreading.   Final Clinical Impression(s) / ED Diagnoses Final diagnoses:  None    Rx / DC Orders ED Discharge Orders    None       Maxwell Caul, PA-C 12/06/20 1926    Virgina Norfolk, DO 12/06/20 2238

## 2020-12-06 NOTE — ED Notes (Signed)
Attempt to call report to [facility]- RN will call back when able to take report

## 2020-12-06 NOTE — ED Notes (Signed)
Date and time results received: 12/06/20 1249   Test: glu Critical Value: 530 Name of Provider Notified: [place] Orders Received? Or Actions Taken?: no orders given

## 2020-12-06 NOTE — ED Notes (Signed)
Patient returned from MRI.

## 2020-12-06 NOTE — ED Notes (Signed)
Pt given meal with verbal approval from [place], [place]

## 2020-12-06 NOTE — H&P (Signed)
History and Physical  Patient Name: Eric Cohen     ZOX:[id number]    DOB: 1956/06/29    DOA: 12/06/2020 PCP: Leilani Able, MD  Patient coming from: Outside facility but initially presented from home  Chief Complaint: Headache; syncopal event   HPI: SHEPPARD LUCKENBACH is a 65 y.o. male with hx of CAD, insulin-dependent diabetes, dyslipidemia, essential hypertension, previous DVT, insomnia, who presented to outside facility secondary to persistent headache and syncopal episode.   Approximately a week ago he was working with a bungee cord when it snapped back and minimum in the frontal part of the head, right side. Since then he said persistent headache/pain at this site. He saw his primary care doctor few days afterwards, benign exam at that time and he was given oxycodone and ibuprofen for pain relief. He also had been trying Tylenol. Tylenol and ibuprofen not working but the oxycodone helped, however his headache persisted. Then on Thursday night, he woke up in the middle night to go the bathroom and then next thing he knew he was on the floor with his wife waking him up. He did not seek medical attention at that time. However he has felt off balance since then. He has had to use a cane for ambulation. He denies any double vision, neck pain, dysuria. He tells me he is only on aspirin daily however home medicine reconciliation says he is on Plavix, aspirin, and Xarelto. He has a history of DVTs when he had COVID. He is followed by cardiology for CAD and history of angina.  ED course: -Vitals on initial presentation: Afebrile, heart rate 70, blood pressure 155/85, maintaining sats on room air. -Labs on initial presentation: Sodium 131, potassium 4.5, chloride 95, bicarb 25, glucose 530, BUN 43, creatinine 2.28, WBC 8.8, hemoglobin 12.7, UA >500 glucose, otherwise bland. -CT of head showed no acute process.  MRI of the brain showed no acute processes, but did show chronic small vessel disease. -He was  given fluid bolus, Compazine, Benadryl, and 4 units of NovoLog. Patient was transferred to [facility] for higher level care.     ROS: A complete and thorough 12 point review of systems obtained, negative listed in HPI.      Past Medical History:  Diagnosis Date  . Asthma   . Coronary artery disease   . Depression   . Diabetes mellitus without complication (HCC)   . Diverticulitis   . DVT (deep venous thrombosis) (HCC)   . Heart disease   . Hyperlipidemia   . Hypertension     Past Surgical History:  Procedure Laterality Date  . APPENDECTOMY    . CHOLECYSTECTOMY    . COLOSTOMY REVERSAL    . KNEE SURGERY    . LEFT HEART CATH AND CORONARY ANGIOGRAPHY N/A 08/31/2019   Procedure: LEFT HEART CATH AND CORONARY ANGIOGRAPHY;  Surgeon: Yates Decamp, MD;  Location: [facility];  Service: Cardiovascular;  Laterality: N/A;    Social History: Patient lives at home.  The patient walks without assistance typically.  Non-smoker.  Allergies  Allergen Reactions  . Shellfish Allergy Anaphylaxis    "close my throat"  . Lisinopril Cough    Family history: family history includes Diabetes in his father; Heart failure in his mother; Hyperlipidemia in his father.  Prior to Admission medications   Medication Sig Start Date End Date Taking? Authorizing Provider  acetaminophen (TYLENOL) 500 MG tablet Take 2 tablets (1,000 mg total) by mouth every 6 (six) hours as needed.  01/19/20   Little, Ambrose Finland, MD  albuterol (PROVENTIL HFA;VENTOLIN HFA) 108 (90 BASE) MCG/ACT inhaler Inhale 2 puffs into the lungs every 6 (six) hours as needed for wheezing.    [provider]  albuterol (PROVENTIL) (2.5 MG/3ML) 0.083% nebulizer solution Take 2.5 mg by nebulization every 6 (six) hours as needed for wheezing or shortness of breath.    [provider]  aspirin 81 MG EC tablet TAKE 1 TABLET BY MOUTH EVERY DAY 09/06/19   Toniann Fail, NP  atorvastatin (LIPITOR) 80 MG tablet TAKE  1 TABLET (80 MG TOTAL) BY MOUTH DAILY AT 6 PM. 02/20/20   Yates Decamp, MD  Budesonide-Formoterol Fumarate (SYMBICORT IN) Inhale 2 puffs into the lungs daily.     [provider]  clopidogrel (PLAVIX) 75 MG tablet TAKE 1 TABLET BY MOUTH EVERY DAY 09/01/20   Tolia, Sunit, DO  doxepin (SINEQUAN) 75 MG capsule Take 75 mg by mouth.    [provider]  empagliflozin (JARDIANCE) 10 MG TABS tablet Take 10 mg by mouth daily before breakfast. 11/15/19   Toniann Fail, NP  ezetimibe (ZETIA) 10 MG tablet TAKE 1 TABLET BY MOUTH EVERY DAY 10/31/20   Tolia, Sunit, DO  fluticasone (FLONASE) 50 MCG/ACT nasal spray Place into both nostrils daily.    [provider]  gabapentin (NEURONTIN) 300 MG capsule Take 300 mg by mouth daily. TID and 2 at bedtime    [provider]  insulin glargine (LANTUS) 100 UNIT/ML injection Inject 68 Units into the skin at bedtime.    [provider]  isosorbide mononitrate (IMDUR) 30 MG 24 hr tablet Take 1 tablet (30 mg total) by mouth daily. 02/08/20 05/08/20  Tolia, Sunit, DO  losartan-hydrochlorothiazide (HYZAAR) 100-25 MG tablet TAKE 1 TABLET BY MOUTH EVERY DAY 10/31/20   Tolia, Sunit, DO  metFORMIN (GLUCOPHAGE) 500 MG tablet TAKE 1 TABLET (500 MG TOTAL) BY MOUTH 2 (TWO) TIMES DAILY WITH A MEAL. 12/21/19   Toniann Fail, NP  metoprolol succinate (TOPROL-XL) 50 MG 24 hr tablet TAKE 1 TABLET BY MOUTH EVERY DAY 12/26/19   Toniann Fail, NP  montelukast (SINGULAIR) 4 MG chewable tablet Chew 4 mg by mouth at bedtime.    [provider]  nitroGLYCERIN (NITROSTAT) 0.4 MG SL tablet Place 1 tablet (0.4 mg total) under the tongue every 5 (five) minutes as needed for chest pain. 10/06/20 01/04/21  Tolia, Sunit, DO  prazosin (MINIPRESS) 5 MG capsule Take 5 mg by mouth at bedtime.    [provider]  Vitamin D, Ergocalciferol, (DRISDOL) 1.25 MG (50000 UT) CAPS capsule Take 50,000 Units by mouth every 7 (seven) days. New Rx to  start taking on Sunday    [provider]       Physical Exam: BP (!) 169/81 (BP Location: Right Arm)   Pulse 73   Temp 98.2 F (36.8 C) (Oral)   Resp 18   Ht 5\' 11"  (1.803 m)   Wt 107.3 kg   SpO2 98%   BMI 32.99 kg/m  General appearance: Well-developed, adult male, alert and in no acute distress .   Eyes: Anicteric, conjunctiva pink, lids and lashes normal. PERRL.    ENT: No nasal deformity, discharge, epistaxis.  Hearing intact. OP moist without lesions.   Neck: No neck masses.  Trachea midline.  No thyromegaly/tenderness. Lymph: No cervical or supraclavicular lymphadenopathy. Skin: Warm and dry.  No jaundice.  No suspicious rashes or lesions. Cardiac: RRR, nl S1-S2, no murmurs appreciated.  Capillary refill is brisk.   No LE edema.  Radial and pedal pulses 2+ and symmetric. Respiratory: Normal respiratory rate and rhythm.  CTAB without rales or wheezes. Abdomen: Abdomen soft.  Nontender to palpitation, bowel sounds present. No ascites, distension, hepatosplenomegaly.   MSK: No deformities or effusions of the large joints of the upper or lower extremities bilaterally.  No cyanosis or clubbing. Neuro: Cranial nerves 2 through 12 grossly intact.  Sensation intact to light touch. Speech is fluent.  Muscle strength equal in upper and lower extremities.    Psych: Sensorium intact and responding to questions, attention normal.  Behavior appropriate.  Affect normal.  Judgment and insight appear normal.     Labs on Admission:  I have personally reviewed following labs and imaging studies: CBC: Recent Labs  Lab 12/06/20 1223  WBC 8.8  HGB 12.7*  HCT 35.6*  MCV 84.4  PLT 235   Basic Metabolic Panel: Recent Labs  Lab 12/06/20 1223  NA 131*  K 4.5  CL 95*  CO2 25  GLUCOSE 530*  BUN 43*  CREATININE 2.28*  CALCIUM 8.7*   GFR: Estimated Creatinine Clearance: 41.3 mL/min (A) (by C-G formula based on SCr of 2.28 mg/dL (H)).  Liver Function Tests: No results for  input(s): AST, ALT, ALKPHOS, BILITOT, PROT, ALBUMIN in the last 168 hours. No results for input(s): LIPASE, AMYLASE in the last 168 hours. No results for input(s): AMMONIA in the last 168 hours. Coagulation Profile: No results for input(s): INR, PROTIME in the last 168 hours. Cardiac Enzymes: No results for input(s): CKTOTAL, CKMB, CKMBINDEX, TROPONINI in the last 168 hours. BNP (last 3 results) No results for input(s): PROBNP in the last 8760 hours. HbA1C: No results for input(s): HGBA1C in the last 72 hours. CBG: Recent Labs  Lab 12/06/20 1206 12/06/20 1558 12/06/20 1958  GLUCAP 499* 362* 325*   Lipid Profile: No results for input(s): CHOL, HDL, LDLCALC, TRIG, CHOLHDL, LDLDIRECT in the last 72 hours. Thyroid Function Tests: No results for input(s): TSH, T4TOTAL, FREET4, T3FREE, THYROIDAB in the last 72 hours. Anemia Panel: No results for input(s): VITAMINB12, FOLATE, FERRITIN, TIBC, IRON, RETICCTPCT in the last 72 hours.   Recent Results (from the past 240 hour(s))  SARS Coronavirus 2 by RT PCR (hospital order, [facility])     Status: None   Collection Time: 12/06/20  8:07 PM  Result Value Ref Range Status   SARS Coronavirus 2 NEGATIVE NEGATIVE Final    Comment: (NOTE) SARS-CoV-2 target nucleic acids are NOT DETECTED.  The SARS-CoV-2 RNA is generally detectable in upper and lower respiratory specimens during the acute phase of infection. The lowest concentration of SARS-CoV-2 viral copies this assay can detect is 250 copies / mL. A negative result does not preclude SARS-CoV-2 infection and should not be used as the sole basis for treatment or other patient management decisions.  A negative result may occur with improper specimen collection / handling, submission of specimen other than nasopharyngeal swab, presence of viral mutation(s) within the areas targeted by this assay, and inadequate number of viral copies (<250 copies / mL). A negative  result must be combined with clinical observations, patient history, and epidemiological information.  Fact Sheet for Patients:   BoilerBrush.com.cyhttps://www.fda.gov/media/136312/download  Fact Sheet for Healthcare Providers: https://pope.com/https://www.fda.gov/media/136313/download  This test is not yet approved or  cleared by the [place] FDA and has been authorized for detection and/or diagnosis of SARS-CoV-2 by FDA under an Emergency Use Authorization (EUA).  This  EUA will remain in effect (meaning this test can be used) for the duration of the COVID-19 declaration under Section 564(b)(1) of the Act, 21 U.S.C. section 360bbb-3(b)(1), unless the authorization is terminated or revoked sooner.  Performed at [facility], [address] [place], [place] [address]            Radiological Exams on Admission: Personally reviewed: CT of head showed no acute process.  MRI of the brain showed no acute processes, but did show chronic small vessel disease. CT Head Wo Contrast  Result Date: 12/06/2020 CLINICAL DATA:  Hit head against solid object.  Dizziness. EXAM: CT HEAD WITHOUT CONTRAST TECHNIQUE: Contiguous axial images were obtained from the base of the skull through the vertex without intravenous contrast. COMPARISON:  November 11, 2006. FINDINGS: Brain: Ventricles and sulci are normal in size and configuration. There is no intracranial mass, hemorrhage, extra-axial fluid collection, or midline shift. The brain parenchyma appears unremarkable. There is no appreciable acute infarct. Vascular: No hyperdense vessel. There are foci of calcification in the left carotid siphon. Skull: Bony calvarium appears intact. Sinuses/Orbits: There is opacification of a mid left ethmoid air cell. Mucosal thickening noted in several ethmoid air cells. Orbits appear symmetric bilaterally. Other: Mastoid air cells are clear. There is debris in the right external auditory canal. IMPRESSION: Brain parenchyma appears  unremarkable.  No mass or hemorrhage. Left carotid siphon region calcification noted. Areas of paranasal sinus disease noted. Probable cerumen in the right external auditory canal. Electronically Signed   By: Bretta Bang III M.D.   On: 12/06/2020 14:14   MR BRAIN WO CONTRAST  Result Date: 12/06/2020 CLINICAL DATA:  Difficulty walking. Additional history provided: Headaches, dizziness since Thursday. EXAM: MRI HEAD WITHOUT CONTRAST TECHNIQUE: Multiplanar, multiecho pulse sequences of the brain and surrounding structures were obtained without intravenous contrast. COMPARISON:  Head CT 12/06/2020.  Head CT 04/24/2006. FINDINGS: Brain: Mild intermittent motion degradation. Cerebral volume is normal for age. Mild multifocal T2/FLAIR hyperintensity within the cerebral white matter is nonspecific, but compatible with chronic small vessel ischemic disease. There is no acute infarct. No evidence of intracranial mass. No chronic intracranial blood products. No extra-axial fluid collection. No midline shift. Vascular: Expected proximal arterial flow voids. Skull and upper cervical spine: No focal marrow lesion. Sinuses/Orbits: Visualized orbits show no acute finding. Mild partial T2 hyperintense opacification of the left ethmoid sinus. Small right maxillary sinus mucous retention cyst. IMPRESSION: Mildly motion degraded exam. No evidence of acute intracranial abnormality, including acute infarction. Mild cerebral white matter chronic small vessel ischemic disease. Mild left ethmoid sinusitis. Small right maxillary sinus mucous retention cyst. Electronically Signed   By: Jackey Loge DO   On: 12/06/2020 15:54    EKG: Independently reviewed.  Normal sinus rhythm.       Assessment/Plan   1.  Syncopal event -Differential includes hypovolemia, arrhythmia, polypharmacy -CT of head showed no acute process.  MRI of the brain showed no acute processes, but did show chronic small vessel disease. -Echocardiogram  ordered -Chest x-ray ordered -Telemetry ordered -Ortho statics ordered -Fall precautions -Gentle IV fluids as below  2.  AKI -Likely multifactorial including NSAID use, hypovolemia (on HCTZ and Jardiance, and hyperglycemia), in the setting of impaired glomerular hemodynamics as he was on losartan  -Creatinine on admission 2.28. Baseline creatinine appears around 1.2 -UA on admission, was bland; no hematuria, no leukocytes, no proteinuria -Hold home HCTZ, Jardiance -Avoid NSAIDs -Working on controlling blood sugars as below -Started on gentle  IV fluids, LR for 1 bag -Follow-up labs ordered  3.  Headache -Possibly related to muscle skeletal injury and/or concussion-like symptoms -CT head and MRI negative for any acute processes as above -Pain control as warranted.  Avoid NSAIDs given AKI  4.  Hyperglycemia in the setting of poorly controlled type 2 diabetes, insulin-dependent -On admission glucose 530 -Hemoglobin A1c ordered -Continue home Lantus at 65 units at night -Glucose checks and sliding scale ACHS -Hold home Metformin and Jardiance due to AKI -Low glucose diet   5. CAD -Underwent left heart cath in November 2020 which demonstrated obstructive CAD involving the OM 2 however vessel small caliber, no intervention, treated medically -Continue home aspirin and statin -Continue home metoprolol. Apparently he was on metoprolol titrate 50 mg just once a morning. Will change to BID -Hold home lisinopril given AKI  6. History of DVT -Patient tells me he is only on aspirin daily however home medicine reconciliation shows he is on aspirin, Plavix, and Xarelto -We will continue aspirin and Xarelto for now. Hold Plavix  7. Dyslipidemia -Continue home statin and Zetia  7. Insomnia -Continue home Ambien -On both prazosin and doxepin as needed for nightmares. We will hold for now    DVT prophylaxis: Home Xarelto Code Status: Full Family Communication: patient only Disposition  Plan: Anticipate discharge home when medically stable Consults called: None Admission status: Observation   At the point of initial evaluation, it is my clinical opinion that admission for OBSERVATION is reasonable and necessary because the patient's presenting complaints in the context of their chronic conditions represent sufficient risk of deterioration or significant morbidity to constitute reasonable grounds for close observation in the hospital setting, but that the patient may be medically stable for discharge from the hospital within 24 to 48 hours.    Medical decision making: Patient seen at 10:10 PM on 12/06/2020.   What exists of the patient's chart was reviewed in depth and summarized above.  Clinical condition: Fair.        Laqueta Due [facility] Please page though AMION or Epic secure chat:  For password, contact charge nurse

## 2020-12-07 ENCOUNTER — Observation Stay (HOSPITAL_BASED_OUTPATIENT_CLINIC_OR_DEPARTMENT_OTHER): Payer: 59

## 2020-12-07 ENCOUNTER — Observation Stay (HOSPITAL_COMMUNITY): Payer: 59

## 2020-12-07 DIAGNOSIS — R55 Syncope and collapse: Secondary | ICD-10-CM | POA: Diagnosis not present

## 2020-12-07 LAB — GLUCOSE, CAPILLARY
Glucose-Capillary: 286 mg/dL — ABNORMAL HIGH (ref 70–99)
Glucose-Capillary: 259 mg/dL — ABNORMAL HIGH (ref 70–99)
Glucose-Capillary: 263 mg/dL — ABNORMAL HIGH (ref 70–99)
Glucose-Capillary: 278 mg/dL — ABNORMAL HIGH (ref 70–99)

## 2020-12-07 LAB — CBC
HCT: 32.1 % — ABNORMAL LOW (ref 39.0–52.0)
Hemoglobin: 11.8 g/dL — ABNORMAL LOW (ref 13.0–17.0)
MCH: 30.7 pg (ref 26.0–34.0)
MCHC: 36.8 g/dL — ABNORMAL HIGH (ref 30.0–36.0)
MCV: 83.6 fL (ref 80.0–100.0)
Platelets: 217 10*3/uL (ref 150–400)
RBC: 3.84 MIL/uL — ABNORMAL LOW (ref 4.22–5.81)
RDW: 16.8 % — ABNORMAL HIGH (ref 11.5–15.5)
WBC: 10 10*3/uL (ref 4.0–10.5)
nRBC: 0 % (ref 0.0–0.2)

## 2020-12-07 LAB — COMPREHENSIVE METABOLIC PANEL
ALT: 24 U/L (ref 0–44)
AST: 20 U/L (ref 15–41)
Albumin: 3.8 g/dL (ref 3.5–5.0)
Alkaline Phosphatase: 83 U/L (ref 38–126)
Anion gap: 9 (ref 5–15)
BUN: 32 mg/dL — ABNORMAL HIGH (ref 8–23)
CO2: 26 mmol/L (ref 22–32)
Calcium: 9 mg/dL (ref 8.9–10.3)
Chloride: 104 mmol/L (ref 98–111)
Creatinine, Ser: 1.54 mg/dL — ABNORMAL HIGH (ref 0.61–1.24)
GFR, Estimated: 50 mL/min — ABNORMAL LOW (ref >60)
Glucose, Bld: 350 mg/dL — ABNORMAL HIGH (ref 70–99)
Potassium: 4.5 mmol/L (ref 3.5–5.1)
Sodium: 139 mmol/L (ref 135–145)
Total Bilirubin: 0.8 mg/dL (ref 0.3–1.2)
Total Protein: 7.1 g/dL (ref 6.5–8.1)

## 2020-12-07 LAB — HEMOGLOBIN A1C
Hgb A1c MFr Bld: 8.7 % — ABNORMAL HIGH (ref 4.8–5.6)
Mean Plasma Glucose: 202.99 mg/dL

## 2020-12-07 LAB — ECHOCARDIOGRAM COMPLETE
Area-P 1/2: 3.27 cm2
Height: 71 in
S' Lateral: 2.4 cm
Weight: 3753.11 oz

## 2020-12-07 LAB — PHOSPHORUS: Phosphorus: 3 mg/dL (ref 2.5–4.6)

## 2020-12-07 LAB — HIV ANTIBODY (ROUTINE TESTING W REFLEX): HIV Screen 4th Generation wRfx: NONREACTIVE

## 2020-12-07 LAB — MAGNESIUM: Magnesium: 2.3 mg/dL (ref 1.7–2.4)

## 2020-12-07 MED ORDER — ACETAMINOPHEN 500 MG PO TABS
500.0000 mg | ORAL_TABLET | Freq: Three times a day (TID) | ORAL | Status: DC
  Administered 2020-12-07 – 2020-12-08 (×3): 500 mg via ORAL
  Filled 2020-12-07 (×3): qty 1

## 2020-12-07 MED ORDER — INSULIN ASPART 100 UNIT/ML ~~LOC~~ SOLN
3.0000 [IU] | Freq: Three times a day (TID) | SUBCUTANEOUS | Status: DC
  Administered 2020-12-07 – 2020-12-08 (×3): 3 [IU] via SUBCUTANEOUS

## 2020-12-07 MED ORDER — SODIUM CHLORIDE 0.9 % IV SOLN: INTRAVENOUS | Status: DC

## 2020-12-07 MED ORDER — TRAMADOL HCL 50 MG PO TABS
50.0000 mg | ORAL_TABLET | Freq: Four times a day (QID) | ORAL | Status: DC | PRN
  Administered 2020-12-07: 50 mg via ORAL
  Filled 2020-12-07: qty 1

## 2020-12-07 MED ORDER — OXYCODONE HCL 5 MG PO TABS
5.0000 mg | ORAL_TABLET | ORAL | Status: DC | PRN
  Administered 2020-12-07 – 2020-12-08 (×3): 5 mg via ORAL
  Filled 2020-12-07 (×3): qty 1

## 2020-12-07 MED ORDER — AMLODIPINE BESYLATE 10 MG PO TABS
10.0000 mg | ORAL_TABLET | Freq: Every day | ORAL | Status: DC
  Administered 2020-12-07 – 2020-12-08 (×2): 10 mg via ORAL
  Filled 2020-12-07 (×3): qty 1

## 2020-12-07 NOTE — Progress Notes (Signed)
PROGRESS NOTE    Eric Cohen  EXB:[id number] DOB: Apr 19, 1956 DOA: 12/06/2020 PCP: Leilani Able, MD   Brief Narrative: 65 year old with past medical history significant for CAD, insulin-dependent diabetes, dyslipidemia, essential hypertension, previous DVT, insomnia, who present to outside facility secondary to persistent headache and syncopal episode.  A week ago he was working with a bungee cord when it snapped back and he frontal part of his head on the right side.  Since then he has been having persistent headache.  He was evaluated by his primary care doctor and was given oxycodone and ibuprofen for pain relief.  On Thursday night he wake up in the middle of the night to go to the bathroom and the next thing he knew he was on the floor, his wife was waking him up.  He did not seek medical attention at that time.  He felt off balance since then.  He has a history of DVT when he had Covid, he is on Xarelto.  Evaluation in the ED showed creatinine 2.2, BUN 43, blood glucose 530.  CT head showed no acute process, MRI of the brain showed no acute process.  Assessment & Plan:   Principal Problem:   Syncope and collapse Active Problems:   Hyperlipidemia   Type 2 diabetes mellitus (HCC)   CAD (coronary artery disease)   AKI (acute kidney injury) (HCC)   Headache   Hyperglycemia due to type 2 diabetes mellitus (HCC)  1-Syncopal episode Related to hypovolemia versus polypharmacy. Continue to monitor on telemetry. MRI of brain negative for acute stroke Echo: Fraction 60%, no significant aortic valve abnormality Orthostatic vital ordered.  EKG normal sinus rhythm.   2-AKI: Multifactorial secondary to NSAID use, hypovolemia, on hydrochlorothiazide. Improved with IV fluids. We will continue with IV fluid for 24 hours.  3-Headache: Related to muscular skeletal injury, concussion-like symptoms MRI negative for acute process Will avoid NSAIDs. Started tramadol.  4-Hyperglycemia  poorly controlled diabetes type 2: Continue with Lantus, sliding scale insulin, will add meal coverage. HB-A1c; 8.7   5-CAD: Underwent heart cath 2020 which demonstrated obstructive CAD involving the OM 2 however vessel is small caliber no intervention treated medically. Continue with aspirin and statin Continue with metoprolol Hold lisinopril due to AKI  6-history of DVT: Continue with Xarelto  7-dyslipidemia: Continue with a [place] starting  Insomnia: Continue with Ambien  HTN; added Norvasc.    Estimated body mass index is 32.72 kg/m as calculated from the following:   Height as of this encounter:  (1.803 m).   Weight as of this encounter: 106.4 kg.   DVT prophylaxis: Xarelto Code Status: Full code Family Communication: Care discussed with patient Disposition Plan:  Status is: Observation  The patient remains OBS appropriate and will d/c before 2 midnights.  Dispo: The patient is from: Home              Anticipated d/c is to: Home              Anticipated d/c date is: 1 day              Patient currently is not medically stable to d/c.   Difficult to place patient No        Consultants:   None  Procedures:   ECHO  Antimicrobials:    Subjective: He is still complaining of Headaches at site that he was hit with a bungee cord.  Denies dyspnea, or chest pain.   Objective: Vitals:   12/07/20 0730  12/07/20 0954 12/07/20 1130 12/07/20 1508  BP: (!) 156/81 (!) 171/95 (!) 172/85 (!) 160/83  Pulse: 84 82 72 78  Resp: 18 16  18   Temp: 98.9 F (37.2 C) 99.1 F (37.3 C) 98.2 F (36.8 C) 98.2 F (36.8 C)  TempSrc: Oral Oral Oral Oral  SpO2: 99% 98% 99% 96%  Weight:      Height:        Intake/Output Summary (Last 24 hours) at 12/07/2020 1700 Last data filed at 12/07/2020 1400 Gross per 24 hour  Intake 1274.7 ml  Output 1575 ml  Net -300.3 ml   Filed Weights   12/06/20 2138 12/07/20 0032 12/07/20 0423  Weight: 107.3 kg 106.4 kg 106.4 kg     Examination:  General exam: Appears calm and comfortable  Respiratory system: Clear to auscultation. Respiratory effort normal. Cardiovascular system: S1 & S2 heard, RRR. No JVD, murmurs, rubs, gallops or clicks. No pedal edema. Gastrointestinal system: Abdomen is nondistended, soft and nontender. No organomegaly or masses felt. Normal bowel sounds heard. Central nervous system: Alert and oriented. No focal neurological deficits. Extremities: Symmetric 5 x 5 power.   Data Reviewed: I have personally reviewed following labs and imaging studies  CBC: Recent Labs  Lab 12/06/20 1223 12/07/20 0329  WBC 8.8 10.0  HGB 12.7* 11.8*  HCT 35.6* 32.1*  MCV 84.4 83.6  PLT 235 217   Basic Metabolic Panel: Recent Labs  Lab 12/06/20 1223 12/07/20 0329  NA 131* 139  K 4.5 4.5  CL 95* 104  CO2 25 26  GLUCOSE 530* 350*  BUN 43* 32*  CREATININE 2.28* 1.54*  CALCIUM 8.7* 9.0  MG  --  2.3  PHOS  --  3.0   GFR: Estimated Creatinine Clearance: 60.9 mL/min (A) (by C-G formula based on SCr of 1.54 mg/dL (H)). Liver Function Tests: Recent Labs  Lab 12/07/20 0329  AST 20  ALT 24  ALKPHOS 83  BILITOT 0.8  PROT 7.1  ALBUMIN 3.8   No results for input(s): LIPASE, AMYLASE in the last 168 hours. No results for input(s): AMMONIA in the last 168 hours. Coagulation Profile: No results for input(s): INR, PROTIME in the last 168 hours. Cardiac Enzymes: No results for input(s): CKTOTAL, CKMB, CKMBINDEX, TROPONINI in the last 168 hours. BNP (last 3 results) No results for input(s): PROBNP in the last 8760 hours. HbA1C: Recent Labs    12/07/20 0329  HGBA1C 8.7*   CBG: Recent Labs  Lab 12/06/20 1958 12/06/20 2215 12/07/20 0610 12/07/20 1133 12/07/20 1507  GLUCAP 325* 379* 286* 259* 263*   Lipid Profile: No results for input(s): CHOL, HDL, LDLCALC, TRIG, CHOLHDL, LDLDIRECT in the last 72 hours. Thyroid Function Tests: No results for input(s): TSH, T4TOTAL, FREET4, T3FREE,  THYROIDAB in the last 72 hours. Anemia Panel: No results for input(s): VITAMINB12, FOLATE, FERRITIN, TIBC, IRON, RETICCTPCT in the last 72 hours. Sepsis Labs: No results for input(s): PROCALCITON, LATICACIDVEN in the last 168 hours.  Recent Results (from the past 240 hour(s))  SARS Coronavirus 2 by RT PCR (hospital order, performed in [facility] hospital lab)     Status: None   Collection Time: 12/06/20  8:07 PM  Result Value Ref Range Status   SARS Coronavirus 2 NEGATIVE NEGATIVE Final    Comment: (NOTE) SARS-CoV-2 target nucleic acids are NOT DETECTED.  The SARS-CoV-2 RNA is generally detectable in upper and lower respiratory specimens during the acute phase of infection. The lowest concentration of SARS-CoV-2 viral copies this assay can detect  is 250 copies / mL. A negative result does not preclude SARS-CoV-2 infection and should not be used as the sole basis for treatment or other patient management decisions.  A negative result may occur with improper specimen collection / handling, submission of specimen other than nasopharyngeal swab, presence of viral mutation(s) within the areas targeted by this assay, and inadequate number of viral copies (<250 copies / mL). A negative result must be combined with clinical observations, patient history, and epidemiological information.  Fact Sheet for Patients:   BoilerBrush.com.cyhttps://www.fda.gov/media/136312/download  Fact Sheet for Healthcare Providers: https://pope.com/https://www.fda.gov/media/136313/download  This test is not yet approved or  cleared by the [place] FDA and has been authorized for detection and/or diagnosis of SARS-CoV-2 by FDA under an Emergency Use Authorization (EUA).  This EUA will remain in effect (meaning this test can be used) for the duration of the COVID-19 declaration under Section 564(b)(1) of the Act, 21 U.S.C. section 360bbb-3(b)(1), unless the authorization is terminated or revoked sooner.  Performed at [facility]  [place], [address] [place], [place] [address]          Radiology Studies: X-ray chest PA and lateral  Result Date: 12/07/2020 CLINICAL DATA:  Syncope.  Hypertension. EXAM: CHEST - 2 VIEW COMPARISON:  August 30, 2019 FINDINGS: Lungs are clear. Heart size and pulmonary vascularity are normal. No adenopathy. No bone lesions. IMPRESSION: Lungs clear.  Cardiac silhouette within normal limits. Electronically Signed   By: Bretta BangWilliam  Woodruff III M.D.   On: 12/07/2020 08:28   CT Head Wo Contrast  Result Date: 12/06/2020 CLINICAL DATA:  Hit head against solid object.  Dizziness. EXAM: CT HEAD WITHOUT CONTRAST TECHNIQUE: Contiguous axial images were obtained from the base of the skull through the vertex without intravenous contrast. COMPARISON:  November 11, 2006. FINDINGS: Brain: Ventricles and sulci are normal in size and configuration. There is no intracranial mass, hemorrhage, extra-axial fluid collection, or midline shift. The brain parenchyma appears unremarkable. There is no appreciable acute infarct. Vascular: No hyperdense vessel. There are foci of calcification in the left carotid siphon. Skull: Bony calvarium appears intact. Sinuses/Orbits: There is opacification of a mid left ethmoid air cell. Mucosal thickening noted in several ethmoid air cells. Orbits appear symmetric bilaterally. Other: Mastoid air cells are clear. There is debris in the right external auditory canal. IMPRESSION: Brain parenchyma appears unremarkable.  No mass or hemorrhage. Left carotid siphon region calcification noted. Areas of paranasal sinus disease noted. Probable cerumen in the right external auditory canal. Electronically Signed   By: Bretta BangWilliam  Woodruff III M.D.   On: 12/06/2020 14:14   MR BRAIN WO CONTRAST  Result Date: 12/06/2020 CLINICAL DATA:  Difficulty walking. Additional history provided: Headaches, dizziness since Thursday. EXAM: MRI HEAD WITHOUT CONTRAST TECHNIQUE: Multiplanar, multiecho pulse sequences  of the brain and surrounding structures were obtained without intravenous contrast. COMPARISON:  Head CT 12/06/2020.  Head CT 04/24/2006. FINDINGS: Brain: Mild intermittent motion degradation. Cerebral volume is normal for age. Mild multifocal T2/FLAIR hyperintensity within the cerebral white matter is nonspecific, but compatible with chronic small vessel ischemic disease. There is no acute infarct. No evidence of intracranial mass. No chronic intracranial blood products. No extra-axial fluid collection. No midline shift. Vascular: Expected proximal arterial flow voids. Skull and upper cervical spine: No focal marrow lesion. Sinuses/Orbits: Visualized orbits show no acute finding. Mild partial T2 hyperintense opacification of the left ethmoid sinus. Small right maxillary sinus mucous retention cyst. IMPRESSION: Mildly motion degraded exam. No evidence of acute intracranial  abnormality, including acute infarction. Mild cerebral white matter chronic small vessel ischemic disease. Mild left ethmoid sinusitis. Small right maxillary sinus mucous retention cyst. Electronically Signed   By: Jackey Loge DO   On: 12/06/2020 15:54   ECHOCARDIOGRAM COMPLETE  Result Date: 12/07/2020    ECHOCARDIOGRAM REPORT   Patient Name:   Eric Cohen Date of Exam: 12/07/2020 Medical Rec #:  [id number]      Height:       71.0 in Accession #:    [id number]     Weight:       234.6 lb Date of Birth:  November 13, 1955      BSA:          2.256 m Patient Age:    65 years       BP:           156/81 mmHg Patient Gender: M              HR:           78 bpm. Exam Location:  Inpatient Procedure: 2D Echo, Color Doppler and Cardiac Doppler Indications:    R55 Syncope  History:        Patient has prior history of Echocardiogram examinations, most                 recent 08/21/2019. Risk Factors:Hypertension, Diabetes and                 Dyslipidemia.  Sonographer:    Irving Burton Senior RDCS Referring Phys: [id number] Kieth Brightly MACNEIL IMPRESSIONS  1. Left  ventricular ejection fraction, by estimation, is 60 to 65%. The left ventricle has normal function. The left ventricle has no regional wall motion abnormalities. There is mild concentric left ventricular hypertrophy. Left ventricular diastolic parameters were normal.  2. Right ventricular systolic function is normal. The right ventricular size is normal.  3. The mitral valve is normal in structure. Trivial mitral valve regurgitation.  4. The aortic valve was not well visualized. There is mild calcification of the aortic valve. There is mild thickening of the aortic valve. Aortic valve regurgitation is trivial. Mild aortic valve sclerosis is present, with no evidence of aortic valve stenosis.  5. Aortic dilatation noted. There is mild dilatation of the ascending aorta, measuring 37 mm.  6. The inferior vena cava is normal in size with greater than 50% respiratory variability, suggesting right atrial pressure of 3 mmHg. Comparison(s): Compared to prior echo report in 2020 (could not see images), the LVEF now appears to be 60-65%. Otherwise no significant changes noted, however, prior echo report with limited detail. FINDINGS  Left Ventricle: Left ventricular ejection fraction, by estimation, is 60 to 65%. The left ventricle has normal function. The left ventricle has no regional wall motion abnormalities. The left ventricular internal cavity size was normal in size. There is  mild concentric left ventricular hypertrophy. Left ventricular diastolic parameters were normal. Right Ventricle: The right ventricular size is normal. Right vetricular wall thickness was not well visualized. Right ventricular systolic function is normal. Left Atrium: Left atrial size was normal in size. Right Atrium: Right atrial size was normal in size. Pericardium: There is no evidence of pericardial effusion. Mitral Valve: The mitral valve is normal in structure. There is mild thickening of the mitral valve leaflet(s). There is mild  calcification of the mitral valve leaflet(s). Mild mitral annular calcification. Trivial mitral valve regurgitation. Tricuspid Valve: The tricuspid valve is normal in structure. Tricuspid valve regurgitation is trivial.  Aortic Valve: The aortic valve was not well visualized. There is mild calcification of the aortic valve. There is mild thickening of the aortic valve. Aortic valve regurgitation is trivial. Mild aortic valve sclerosis is present, with no evidence of aortic valve stenosis. Pulmonic Valve: The pulmonic valve was grossly normal. Pulmonic valve regurgitation is trivial. Aorta: Aortic dilatation noted. There is mild dilatation of the ascending aorta, measuring 37 mm. Venous: The inferior vena cava is normal in size with greater than 50% respiratory variability, suggesting right atrial pressure of 3 mmHg. IAS/Shunts: No atrial level shunt detected by color flow Doppler.  LEFT VENTRICLE PLAX 2D LVIDd:         4.00 cm  Diastology LVIDs:         2.40 cm  LV e' medial:    7.83 cm/s LV PW:         1.30 cm  LV E/e' medial:  12.2 LV IVS:        1.20 cm  LV e' lateral:   10.10 cm/s LVOT diam:     2.30 cm  LV E/e' lateral: 9.5 LV SV:         103 LV SV Index:   45 LVOT Area:     4.15 cm  RIGHT VENTRICLE RV S prime:     13.80 cm/s TAPSE (M-mode): 2.4 cm LEFT ATRIUM             Index       RIGHT ATRIUM           Index LA diam:        3.60 cm 1.60 cm/m  RA Area:     15.70 cm LA Vol (A2C):   59.5 ml 26.37 ml/m RA Volume:   35.90 ml  15.91 ml/m LA Vol (A4C):   57.3 ml 25.39 ml/m LA Biplane Vol: 60.4 ml 26.77 ml/m  AORTIC VALVE LVOT Vmax:   122.00 cm/s LVOT Vmean:  86.700 cm/s LVOT VTI:    0.247 m  AORTA Ao Root diam: 3.10 cm Ao Asc diam:  3.70 cm MITRAL VALVE MV Area (PHT): 3.27 cm    SHUNTS MV Decel Time: 232 msec    Systemic VTI:  0.25 m MV E velocity: 95.50 cm/s  Systemic Diam: 2.30 cm MV A velocity: 86.50 cm/s MV E/A ratio:  1.10 Laurance Flatten MD Electronically signed by Laurance Flatten MD Signature  Date/Time: 12/07/2020/11:56:38 AM    Final         Scheduled Meds: . acetaminophen  500 mg Oral TID  . amLODipine  10 mg Oral Daily  . aspirin EC  81 mg Oral Daily  . atorvastatin  80 mg Oral q1800  . carbamazepine  200 mg Oral QHS  . escitalopram  10 mg Oral q morning  . ezetimibe  10 mg Oral Daily  . gabapentin  600 mg Oral QHS  . insulin aspart  0-15 Units Subcutaneous TID WC  . insulin aspart  0-5 Units Subcutaneous QHS  . insulin aspart  3 Units Subcutaneous TID WC  . insulin glargine  65 Units Subcutaneous QHS  . metoprolol tartrate  50 mg Oral BID  . montelukast  10 mg Oral q morning  . rivaroxaban  20 mg Oral q morning  . sodium chloride flush  3 mL Intravenous Q12H   Continuous Infusions: . sodium chloride 75 mL/hr at 12/07/20 0946     LOS: 0 days    Time spent: 35 minutes.     Jakorey Mcconathy A  Adaijah Endres, MD [facility]   If 7PM-7AM, please contact night-coverage [email or web address]  12/07/2020, 5:00 PM

## 2020-12-07 NOTE — Progress Notes (Signed)
Echocardiogram 2D Echocardiogram has been performed.  Eric Cohen 12/07/2020, 8:49 AM

## 2020-12-07 NOTE — Progress Notes (Signed)
   12/07/20 1501  Clinical Encounter Type  Visited With Patient and family together  Visit Type Initial;Spiritual support  Referral From Patient  Spiritual Encounters  Spiritual Needs Prayer   Chaplain responded to a consult. Patient requested prayer for healing and for a good spirit during his hospitalization. Chaplain offered prayer and support.Chaplain introduced spiritual care services. Spiritual care services available as needed.   Alda Ponder, Chaplain

## 2020-12-07 NOTE — Progress Notes (Signed)
   12/07/20 0032  Orthostatic Lying   BP- Lying 173/89  Pulse- Lying 73  Orthostatic Sitting  BP- Sitting 175/84  Pulse- Sitting 77  Orthostatic Standing at 0 minutes  BP- Standing at 0 minutes 173/89  Pulse- Standing at 0 minutes 81  Orthostatic Standing at 3 minutes  BP- Standing at 3 minutes 179/84  Pulse- Standing at 3 minutes 79  Height and Weight  Weight 106.4 kg  Type of Scale Used Standing (scale a)  Type of Weight Actual  BMI (Calculated) 32.73

## 2020-12-08 DIAGNOSIS — R55 Syncope and collapse: Secondary | ICD-10-CM | POA: Diagnosis not present

## 2020-12-08 LAB — GLUCOSE, CAPILLARY
Glucose-Capillary: 135 mg/dL — ABNORMAL HIGH (ref 70–99)
Glucose-Capillary: 197 mg/dL — ABNORMAL HIGH (ref 70–99)

## 2020-12-08 LAB — BASIC METABOLIC PANEL
Anion gap: 10 (ref 5–15)
BUN: 22 mg/dL (ref 8–23)
CO2: 25 mmol/L (ref 22–32)
Calcium: 8.8 mg/dL — ABNORMAL LOW (ref 8.9–10.3)
Chloride: 104 mmol/L (ref 98–111)
Creatinine, Ser: 1.27 mg/dL — ABNORMAL HIGH (ref 0.61–1.24)
GFR, Estimated: >60 mL/min (ref >60)
Glucose, Bld: 188 mg/dL — ABNORMAL HIGH (ref 70–99)
Potassium: 3.6 mmol/L (ref 3.5–5.1)
Sodium: 139 mmol/L (ref 135–145)

## 2020-12-08 MED ORDER — AMLODIPINE BESYLATE 10 MG PO TABS: 10.0000 mg | ORAL_TABLET | Freq: Every day | ORAL | 0 refills | Status: DC

## 2020-12-08 MED ORDER — METOPROLOL TARTRATE 50 MG PO TABS: 50.0000 mg | ORAL_TABLET | Freq: Two times a day (BID) | ORAL | 0 refills | Status: AC

## 2020-12-08 MED ORDER — POLYETHYLENE GLYCOL 3350 17 G PO PACK: 17.0000 g | PACK | Freq: Every day | ORAL | 0 refills | Status: DC | PRN

## 2020-12-08 MED ORDER — TRAMADOL HCL 50 MG PO TABS
50.0000 mg | ORAL_TABLET | Freq: Four times a day (QID) | ORAL | 0 refills | Status: AC | PRN
Start: 2020-12-08 — End: 2020-12-13

## 2020-12-08 NOTE — Plan of Care (Signed)
  Problem: Education: Goal: Knowledge of General Education information will improve Description: Including pain rating scale, medication(s)/side effects and non-pharmacologic comfort measures Outcome: Adequate for Discharge   

## 2020-12-08 NOTE — Progress Notes (Signed)
Discharge instructions (including medications) discussed with and copy provided to patient/caregiver 

## 2020-12-08 NOTE — Discharge Summary (Signed)
Physician Discharge Summary  Eric Cohen BJY:[id number] DOB: October 28, 1955 DOA: 12/06/2020  PCP: Leilani Able, MD  Admit date: 12/06/2020 Discharge date: 12/08/2020  Admitted From: Home  Disposition:  Home   Recommendations for Outpatient Follow-up:  1. Follow up with PCP in 1-2 weeks 2. Please obtain BMP/CBC in one week 3. Needs follow up BP, Renal function and CBG.  Home Health: none  Discharge Condition: Stable.  CODE STATUS: Full code Diet recommendation: Heart Healthy  Brief/Interim Summary: 65 year old with past medical history significant for CAD, insulin-dependent diabetes, dyslipidemia, essential hypertension, previous DVT, insomnia, who present to outside facility secondary to persistent headache and syncopal episode.  A week ago he was working with a bungee cord when it snapped back and he frontal part of his head on the right side.  Since then he has been having persistent headache.  He was evaluated by his primary care doctor and was given oxycodone and ibuprofen for pain relief.  On Thursday night he wake up in the middle of the night to go to the bathroom and the next thing he knew he was on the floor, his wife was waking him up.  He did not seek medical attention at that time.  He felt off balance since then.  He has a history of DVT when he had Covid, he is on Xarelto.  Evaluation in the ED showed creatinine 2.2, BUN 43, blood glucose 530.  CT head showed no acute process, MRI of the brain showed no acute process.   1-Syncopal episode Related to hypovolemia versus polypharmacy. Continue to monitor on telemetry. MRI of brain negative for acute stroke Echo: Fraction 60%, no significant aortic valve abnormality Orthostatic vital ordered.  EKG normal sinus rhythm.   2-AKI: Multifactorial secondary to NSAID use, hypovolemia, on hydrochlorothiazide. Improved with IV fluids. Improved, cr down to 1.2.   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 Started tramadol. Report tramadol helps more than oxycodone.   4-Hyperglycemia poorly controlled diabetes type 2: Continue with Lantus, sliding scale insulin, will add meal coverage. HB-A1c; 8.7   improved. Resume home regimen.  He will work on diet, does not want to be at this point on SSI meals coverage at home.   5-CAD: Underwent heart cath 2020 which demonstrated obstructive CAD involving the OM 2 however vessel is small caliber no intervention treated medically. Continue with aspirin and statin Continue with metoprolol Hold lisinopril due to AKI  6-history of DVT: Continue with Xarelto  7-dyslipidemia: Continue with a Zetia, statins.   Insomnia: Continue with Ambien  HTN; continue with Norvasc metoprolol   Discharge Diagnoses:  Principal Problem:   Syncope and collapse Active Problems:   Hyperlipidemia   Type 2 diabetes mellitus (HCC)   CAD (coronary artery disease)   AKI (acute kidney injury) (HCC)   Headache   Hyperglycemia due to type 2 diabetes mellitus [facility])    Discharge Instructions  Discharge Instructions    Diet - low sodium heart healthy   Complete by: As directed    Increase activity slowly   Complete by: As directed      Allergies as of 12/08/2020      Reactions   Shellfish Allergy Anaphylaxis   "close my throat"   Lisinopril Cough      Medication List    STOP taking these medications   ibuprofen 200 MG tablet Commonly known as: ADVIL   losartan-hydrochlorothiazide 100-25 MG tablet Commonly known as: HYZAAR  Oxycodone HCl 10 MG Tabs     TAKE these medications   albuterol 108 (90 Base) MCG/ACT inhaler Commonly known as: VENTOLIN HFA Inhale 2 puffs into the lungs every 6 (six) hours as needed for wheezing.   albuterol (2.5 MG/3ML) 0.083% nebulizer solution Commonly known as: PROVENTIL Take 2.5 mg by nebulization every 6 (six) hours as needed for  wheezing or shortness of breath.   amLODipine 10 MG tablet Commonly known as: NORVASC Take 1 tablet (10 mg total) by mouth daily. Start taking on: December 09, 2020   aspirin 81 MG EC tablet TAKE 1 TABLET BY MOUTH EVERY DAY What changed: when to take this   atorvastatin 80 MG tablet Commonly known as: LIPITOR TAKE 1 TABLET (80 MG TOTAL) BY MOUTH DAILY AT 6 PM.   azelastine 0.1 % nasal spray Commonly known as: ASTELIN Place 1 spray into both nostrils 2 (two) times daily as needed (congestion). Use in each nostril as directed   budesonide-formoterol 160-4.5 MCG/ACT inhaler Commonly known as: SYMBICORT Inhale 2 puffs into the lungs 2 (two) times daily as needed (shortness of breath/wheezing).   carbamazepine 200 MG tablet Commonly known as: TEGRETOL Take 200 mg by mouth at bedtime.   cetirizine 10 MG tablet Commonly known as: ZYRTEC Take 10 mg by mouth at bedtime.   clopidogrel 75 MG tablet Commonly known as: PLAVIX TAKE 1 TABLET BY MOUTH EVERY DAY What changed: when to take this   doxepin 75 MG capsule Commonly known as: SINEQUAN Take 75 mg by mouth at bedtime as needed (nightmares).   escitalopram 10 MG tablet Commonly known as: LEXAPRO Take 10 mg by mouth every morning.   ezetimibe 10 MG tablet Commonly known as: ZETIA TAKE 1 TABLET BY MOUTH EVERY DAY   fluticasone 50 MCG/ACT nasal spray Commonly known as: FLONASE Place 1 spray into both nostrils daily as needed (congestion).   gabapentin 300 MG capsule Commonly known as: NEURONTIN Take 600 mg by mouth at bedtime.   insulin glargine 100 UNIT/ML Solostar Pen Commonly known as: LANTUS Inject 68 Units into the skin at bedtime.   Jardiance 10 MG Tabs tablet Generic drug: empagliflozin Take 10 mg by mouth daily before breakfast.   metoprolol tartrate 50 MG tablet Commonly known as: LOPRESSOR Take 1 tablet (50 mg total) by mouth 2 (two) times daily. What changed: when to take this   montelukast 10 MG  tablet Commonly known as: SINGULAIR Take 10 mg by mouth every morning.   nitroGLYCERIN 0.4 MG SL tablet Commonly known as: NITROSTAT Place 1 tablet (0.4 mg total) under the tongue every 5 (five) minutes as needed for chest pain.   polyethylene glycol 17 g packet Commonly known as: MIRALAX / GLYCOLAX Take 17 g by mouth daily as needed for mild constipation.   prazosin 5 MG capsule Commonly known as: MINIPRESS Take 5 mg by mouth at bedtime as needed (nightmares).   rivaroxaban 20 MG Tabs tablet Commonly known as: XARELTO Take 20 mg by mouth every morning.   traMADol 50 MG tablet Commonly known as: ULTRAM Take 1 tablet (50 mg total) by mouth every 6 (six) hours as needed for up to 5 days for moderate pain.   zolpidem 10 MG tablet Commonly known as: AMBIEN Take 10 mg by mouth at bedtime as needed for sleep.       Follow-up Information    Leilani Able, MD Follow up in 3 day(s).   Specialty: Family Medicine Contact information: [address] [place]  [place] [address] [phone]              Allergies  Allergen Reactions  . Shellfish Allergy Anaphylaxis    "close my throat"  . Lisinopril Cough    Consultations: none  Procedures/Studies: X-ray chest PA and lateral  Result Date: 12/07/2020 CLINICAL DATA:  Syncope.  Hypertension. EXAM: CHEST - 2 VIEW COMPARISON:  August 30, 2019 FINDINGS: Lungs are clear. Heart size and pulmonary vascularity are normal. No adenopathy. No bone lesions. IMPRESSION: Lungs clear.  Cardiac silhouette within normal limits. Electronically Signed   By: Bretta Bang III M.D.   On: 12/07/2020 08:28   CT Head Wo Contrast  Result Date: 12/06/2020 CLINICAL DATA:  Hit head against solid object.  Dizziness. EXAM: CT HEAD WITHOUT CONTRAST TECHNIQUE: Contiguous axial images were obtained from the base of the skull through the vertex without intravenous contrast. COMPARISON:  November 11, 2006. FINDINGS: Brain: Ventricles and sulci are normal  in size and configuration. There is no intracranial mass, hemorrhage, extra-axial fluid collection, or midline shift. The brain parenchyma appears unremarkable. There is no appreciable acute infarct. Vascular: No hyperdense vessel. There are foci of calcification in the left carotid siphon. Skull: Bony calvarium appears intact. Sinuses/Orbits: There is opacification of a mid left ethmoid air cell. Mucosal thickening noted in several ethmoid air cells. Orbits appear symmetric bilaterally. Other: Mastoid air cells are clear. There is debris in the right external auditory canal. IMPRESSION: Brain parenchyma appears unremarkable.  No mass or hemorrhage. Left carotid siphon region calcification noted. Areas of paranasal sinus disease noted. Probable cerumen in the right external auditory canal. Electronically Signed   By: Bretta Bang III M.D.   On: 12/06/2020 14:14   MR BRAIN WO CONTRAST  Result Date: 12/06/2020 CLINICAL DATA:  Difficulty walking. Additional history provided: Headaches, dizziness since Thursday. EXAM: MRI HEAD WITHOUT CONTRAST TECHNIQUE: Multiplanar, multiecho pulse sequences of the brain and surrounding structures were obtained without intravenous contrast. COMPARISON:  Head CT 12/06/2020.  Head CT 04/24/2006. FINDINGS: Brain: Mild intermittent motion degradation. Cerebral volume is normal for age. Mild multifocal T2/FLAIR hyperintensity within the cerebral white matter is nonspecific, but compatible with chronic small vessel ischemic disease. There is no acute infarct. No evidence of intracranial mass. No chronic intracranial blood products. No extra-axial fluid collection. No midline shift. Vascular: Expected proximal arterial flow voids. Skull and upper cervical spine: No focal marrow lesion. Sinuses/Orbits: Visualized orbits show no acute finding. Mild partial T2 hyperintense opacification of the left ethmoid sinus. Small right maxillary sinus mucous retention cyst. IMPRESSION: Mildly  motion degraded exam. No evidence of acute intracranial abnormality, including acute infarction. Mild cerebral white matter chronic small vessel ischemic disease. Mild left ethmoid sinusitis. Small right maxillary sinus mucous retention cyst. Electronically Signed   By: Jackey Loge DO   On: 12/06/2020 15:54   ECHOCARDIOGRAM COMPLETE  Result Date: 12/07/2020    ECHOCARDIOGRAM REPORT   Patient Name:   [name] Date of Exam: 12/07/2020 Medical Rec #:  [id number]      Height:       71.0 in Accession #:    [id number]     Weight:       234.6 lb Date of Birth:  02/17/1956      BSA:          2.256 m Patient Age:    65 years       BP:           156/81 mmHg Patient Gender: M  HR:           78 bpm. Exam Location:  Inpatient Procedure: 2D Echo, Color Doppler and Cardiac Doppler Indications:    R55 Syncope  History:        Patient has prior history of Echocardiogram examinations, most                 recent 08/21/2019. Risk Factors:Hypertension, Diabetes and                 Dyslipidemia.  Sonographer:    Irving Burton Senior RDCS Referring Phys: [id number] Kieth Brightly MACNEIL IMPRESSIONS  1. Left ventricular ejection fraction, by estimation, is 60 to 65%. The left ventricle has normal function. The left ventricle has no regional wall motion abnormalities. There is mild concentric left ventricular hypertrophy. Left ventricular diastolic parameters were normal.  2. Right ventricular systolic function is normal. The right ventricular size is normal.  3. The mitral valve is normal in structure. Trivial mitral valve regurgitation.  4. The aortic valve was not well visualized. There is mild calcification of the aortic valve. There is mild thickening of the aortic valve. Aortic valve regurgitation is trivial. Mild aortic valve sclerosis is present, with no evidence of aortic valve stenosis.  5. Aortic dilatation noted. There is mild dilatation of the ascending aorta, measuring 37 mm.  6. The inferior vena cava is normal in size  with greater than 50% respiratory variability, suggesting right atrial pressure of 3 mmHg. Comparison(s): Compared to prior echo report in 2020 (could not see images), the LVEF now appears to be 60-65%. Otherwise no significant changes noted, however, prior echo report with limited detail. FINDINGS  Left Ventricle: Left ventricular ejection fraction, by estimation, is 60 to 65%. The left ventricle has normal function. The left ventricle has no regional wall motion abnormalities. The left ventricular internal cavity size was normal in size. There is  mild concentric left ventricular hypertrophy. Left ventricular diastolic parameters were normal. Right Ventricle: The right ventricular size is normal. Right vetricular wall thickness was not well visualized. Right ventricular systolic function is normal. Left Atrium: Left atrial size was normal in size. Right Atrium: Right atrial size was normal in size. Pericardium: There is no evidence of pericardial effusion. Mitral Valve: The mitral valve is normal in structure. There is mild thickening of the mitral valve leaflet(s). There is mild calcification of the mitral valve leaflet(s). Mild mitral annular calcification. Trivial mitral valve regurgitation. Tricuspid Valve: The tricuspid valve is normal in structure. Tricuspid valve regurgitation is trivial. Aortic Valve: The aortic valve was not well visualized. There is mild calcification of the aortic valve. There is mild thickening of the aortic valve. Aortic valve regurgitation is trivial. Mild aortic valve sclerosis is present, with no evidence of aortic valve stenosis. Pulmonic Valve: The pulmonic valve was grossly normal. Pulmonic valve regurgitation is trivial. Aorta: Aortic dilatation noted. There is mild dilatation of the ascending aorta, measuring 37 mm. Venous: The inferior vena cava is normal in size with greater than 50% respiratory variability, suggesting right atrial pressure of 3 mmHg. IAS/Shunts: No atrial  level shunt detected by color flow Doppler.  LEFT VENTRICLE PLAX 2D LVIDd:         4.00 cm  Diastology LVIDs:         2.40 cm  LV e' medial:    7.83 cm/s LV PW:         1.30 cm  LV E/e' medial:  12.2 LV IVS:  1.20 cm  LV e' lateral:   10.10 cm/s LVOT diam:     2.30 cm  LV E/e' lateral: 9.5 LV SV:         103 LV SV Index:   45 LVOT Area:     4.15 cm  RIGHT VENTRICLE RV S prime:     13.80 cm/s TAPSE (M-mode): 2.4 cm LEFT ATRIUM             Index       RIGHT ATRIUM           Index LA diam:        3.60 cm 1.60 cm/m  RA Area:     15.70 cm LA Vol (A2C):   59.5 ml 26.37 ml/m RA Volume:   35.90 ml  15.91 ml/m LA Vol (A4C):   57.3 ml 25.39 ml/m LA Biplane Vol: 60.4 ml 26.77 ml/m  AORTIC VALVE LVOT Vmax:   122.00 cm/s LVOT Vmean:  86.700 cm/s LVOT VTI:    0.247 m  AORTA Ao Root diam: 3.10 cm Ao Asc diam:  3.70 cm MITRAL VALVE MV Area (PHT): 3.27 cm    SHUNTS MV Decel Time: 232 msec    Systemic VTI:  0.25 m MV E velocity: 95.50 cm/s  Systemic Diam: 2.30 cm MV A velocity: 86.50 cm/s MV E/A ratio:  1.10 Laurance FlattenHeather Pemberton MD Electronically signed by Laurance FlattenHeather Pemberton MD Signature Date/Time: 12/07/2020/11:56:38 AM    Final       Subjective: Feeling better. Still with headaches. With pain medication decreased to 3.   Discharge Exam: Vitals:   12/07/20 1508 12/07/20 1929  BP: (!) 160/83 (!) 163/79  Pulse: 78 75  Resp: 18 19  Temp: 98.2 F (36.8 C) 99.1 F (37.3 C)  SpO2: 96% 97%     General: Pt is alert, awake, not in acute distress Cardiovascular: RRR, S1/S2 +, no rubs, no gallops Respiratory: CTA bilaterally, no wheezing, no rhonchi Abdominal: Soft, NT, ND, bowel sounds + Extremities: no edema, no cyanosis    The results of significant diagnostics from this hospitalization (including imaging, microbiology, ancillary and laboratory) are listed below for reference.     Microbiology: Recent Results (from the past 240 hour(s))  SARS Coronavirus 2 by RT PCR (hospital order, performed [facility]  [facility])     Status: None   Collection Time: 12/06/20  8:07 PM  Result Value Ref Range Status   SARS Coronavirus 2 NEGATIVE NEGATIVE Final    Comment: (NOTE) SARS-CoV-2 target nucleic acids are NOT DETECTED.  The SARS-CoV-2 RNA is generally detectable in upper and lower respiratory specimens during the acute phase of infection. The lowest concentration of SARS-CoV-2 viral copies this assay can detect is 250 copies / mL. A negative result does not preclude SARS-CoV-2 infection and should not be used as the sole basis for treatment or other patient management decisions.  A negative result may occur with improper specimen collection / handling, submission of specimen other than nasopharyngeal swab, presence of viral mutation(s) within the areas targeted by this assay, and inadequate number of viral copies (<250 copies / mL). A negative result must be combined with clinical observations, patient history, and epidemiological information.  Fact Sheet for Patients:   BoilerBrush.com.cyhttps://www.fda.gov/media/136312/download  Fact Sheet for Healthcare Providers: https://pope.com/https://www.fda.gov/media/136313/download  This test is not yet approved or  cleared by the [place] FDA and has been authorized for detection and/or diagnosis of SARS-CoV-2 by FDA under an Emergency Use Authorization (EUA).  This EUA  will remain in effect (meaning this test can be used) for the duration of the COVID-19 declaration under Section 564(b)(1) of the Act, 21 U.S.C. section 360bbb-3(b)(1), unless the authorization is terminated or revoked sooner.  Performed at [facility], [address] [place], [place] [address]      Labs: BNP (last 3 results) No results for input(s): BNP in the last 8760 hours. Basic Metabolic Panel: Recent Labs  Lab 12/06/20 1223 12/07/20 0329 12/08/20 0223  NA 131* 139 139  K 4.5 4.5 3.6  CL 95* 104 104  CO2 GLUCOSE 530* 350* 188*  BUN 43* 32* 22   CREATININE 2.28* 1.54* 1.27*  CALCIUM 8.7* 9.0 8.8*  MG  --  2.3  --   PHOS  --  3.0  --    Liver Function Tests: Recent Labs  Lab 12/07/20 0329  AST 20  ALT 24  ALKPHOS 83  BILITOT 0.8  PROT 7.1  ALBUMIN 3.8   No results for input(s): LIPASE, AMYLASE in the last 168 hours. No results for input(s): AMMONIA in the last 168 hours. CBC: Recent Labs  Lab 12/06/20 1223 12/07/20 0329  WBC 8.8 10.0  HGB 12.7* 11.8*  HCT 35.6* 32.1*  MCV 84.4 83.6  PLT 235 217   Cardiac Enzymes: No results for input(s): CKTOTAL, CKMB, CKMBINDEX, TROPONINI in the last 168 hours. BNP: Invalid input(s): POCBNP CBG: Recent Labs  Lab 12/07/20 0610 12/07/20 1133 12/07/20 1507 12/07/20 2111 12/08/20 0608  GLUCAP 286* 259* 263* 278* 135*   D-Dimer No results for input(s): DDIMER in the last 72 hours. Hgb A1c Recent Labs    12/07/20 0329  HGBA1C 8.7*   Lipid Profile No results for input(s): CHOL, HDL, LDLCALC, TRIG, CHOLHDL, LDLDIRECT in the last 72 hours. Thyroid function studies No results for input(s): TSH, T4TOTAL, T3FREE, THYROIDAB in the last 72 hours.  Invalid input(s): FREET3 Anemia work up No results for input(s): VITAMINB12, FOLATE, FERRITIN, TIBC, IRON, RETICCTPCT in the last 72 hours. Urinalysis    Component Value Date/Time   COLORURINE STRAW (A) 12/06/2020 1223   APPEARANCEUR CLEAR 12/06/2020 1223   LABSPEC 1.010 12/06/2020 1223   PHURINE 5.0 12/06/2020 1223   GLUCOSEU >=500 (A) 12/06/2020 1223   HGBUR NEGATIVE 12/06/2020 1223   BILIRUBINUR NEGATIVE 12/06/2020 1223   KETONESUR NEGATIVE 12/06/2020 1223   PROTEINUR NEGATIVE 12/06/2020 1223   NITRITE NEGATIVE 12/06/2020 1223   LEUKOCYTESUR NEGATIVE 12/06/2020 1223   Sepsis Labs Invalid input(s): PROCALCITONIN,  WBC,  LACTICIDVEN Microbiology Recent Results (from the past 240 hour(s))  SARS Coronavirus 2 by RT PCR (hospital order, performed in [facility] hospital lab)     Status: None   Collection Time:  12/06/20  8:07 PM  Result Value Ref Range Status   SARS Coronavirus 2 NEGATIVE NEGATIVE Final    Comment: (NOTE) SARS-CoV-2 target nucleic acids are NOT DETECTED.  The SARS-CoV-2 RNA is generally detectable in upper and lower respiratory specimens during the acute phase of infection. The lowest concentration of SARS-CoV-2 viral copies this assay can detect is 250 copies / mL. A negative result does not preclude SARS-CoV-2 infection and should not be used as the sole basis for treatment or other patient management decisions.  A negative result may occur with improper specimen collection / handling, submission of specimen other than nasopharyngeal swab, presence of viral mutation(s) within the areas targeted by this assay, and inadequate number of viral copies (<250 copies / mL). A negative result  must be combined with clinical observations, patient history, and epidemiological information.  Fact Sheet for Patients:   BoilerBrush.com.cy  Fact Sheet for Healthcare Providers: https://pope.com/  This test is not yet approved or  cleared by the [place] FDA and has been authorized for detection and/or diagnosis of SARS-CoV-2 by FDA under an Emergency Use Authorization (EUA).  This EUA will remain in effect (meaning this test can be used) for the duration of the COVID-19 declaration under Section 564(b)(1) of the Act, 21 U.S.C. section 360bbb-3(b)(1), unless the authorization is terminated or revoked sooner.  Performed at [facility], [address] [place], [place] [address]      Time coordinating discharge: 40 minutes  SIGNED:   Alba Cory, MD  [facility]

## 2020-12-25 ENCOUNTER — Ambulatory Visit (INDEPENDENT_AMBULATORY_CARE_PROVIDER_SITE_OTHER): Payer: 59 | Admitting: Nurse Practitioner

## 2020-12-25 VITALS — BP 154/69 | HR 68 | Temp 97.2°F | Ht 71.0 in | Wt 241.0 lb

## 2020-12-25 DIAGNOSIS — Z8616 Personal history of COVID-19: Secondary | ICD-10-CM | POA: Diagnosis not present

## 2020-12-25 DIAGNOSIS — I82403 Acute embolism and thrombosis of unspecified deep veins of lower extremity, bilateral: Secondary | ICD-10-CM

## 2020-12-25 NOTE — Patient Instructions (Addendum)
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

## 2020-12-25 NOTE — Progress Notes (Signed)
  ID: Eric Cohen, male    DOB: 1956-08-29, 65 y.o.   MRN: [id number]  Chief Complaint  Patient presents with  . Headache    And blood clot in the leg and when he starts to exercise. HA going on a month     Referring provider: Leilani Able, MD    HPI  Patient presents today for post COVID care clinic visit.  Patient has a positive for Covid in February 2021.  Patient states that since that time he has been having ongoing issues with headache and recurrent blood clots to lower extremities.  We discussed that he will need an appointment set up to see vascular.  Patient does already have an appointment set up to meet with neurology for next month.  Patient will need to keep a headache log.  Handout given today. Denies f/c/s, n/v/d, hemoptysis, PND, chest pain or edema.      Allergies  Allergen Reactions  . Shellfish Allergy Anaphylaxis    "close my throat"  . Lisinopril Cough     There is no immunization history on file for this patient.  Past Medical History:  Diagnosis Date  . Asthma   . Coronary artery disease   . Depression   . Diabetes mellitus without complication (HCC)   . Diverticulitis   . DVT (deep venous thrombosis) (HCC)   . Heart disease   . Hyperlipidemia   . Hypertension     Tobacco History: Social History   Tobacco Use  Smoking Status Never Smoker  Smokeless Tobacco Never Used   Counseling given: Not Answered   Outpatient Encounter Medications as of 12/25/2020  Medication Sig  . albuterol (PROVENTIL HFA;VENTOLIN HFA) 108 (90 BASE) MCG/ACT inhaler Inhale 2 puffs into the lungs every 6 (six) hours as needed for wheezing.  Marland Kitchen albuterol (PROVENTIL) (2.5 MG/3ML) 0.083% nebulizer solution Take 2.5 mg by nebulization every 6 (six) hours as needed for wheezing or shortness of breath.  Marland Kitchen amLODipine (NORVASC) 10 MG tablet Take 1 tablet (10 mg total) by mouth daily.  Marland Kitchen aspirin 81 MG EC tablet TAKE 1 TABLET BY MOUTH EVERY DAY (Patient taking  differently: Take 81 mg by mouth every morning.)  . atorvastatin (LIPITOR) 80 MG tablet TAKE 1 TABLET (80 MG TOTAL) BY MOUTH DAILY AT 6 PM.  . azelastine (ASTELIN) 0.1 % nasal spray Place 1 spray into both nostrils 2 (two) times daily as needed (congestion). Use in each nostril as directed  . budesonide-formoterol (SYMBICORT) 160-4.5 MCG/ACT inhaler Inhale 2 puffs into the lungs 2 (two) times daily as needed (shortness of breath/wheezing).  . carbamazepine (TEGRETOL) 200 MG tablet Take 200 mg by mouth at bedtime.  . cetirizine (ZYRTEC) 10 MG tablet Take 10 mg by mouth at bedtime.  . clopidogrel (PLAVIX) 75 MG tablet TAKE 1 TABLET BY MOUTH EVERY DAY (Patient taking differently: Take 75 mg by mouth every morning.)  . doxepin (SINEQUAN) 75 MG capsule Take 75 mg by mouth at bedtime as needed (nightmares).  Marland Kitchen escitalopram (LEXAPRO) 10 MG tablet Take 10 mg by mouth every morning.  . ezetimibe (ZETIA) 10 MG tablet TAKE 1 TABLET BY MOUTH EVERY DAY (Patient taking differently: Take 10 mg by mouth daily.)  . fluticasone (FLONASE) 50 MCG/ACT nasal spray Place 1 spray into both nostrils daily as needed (congestion).  . gabapentin (NEURONTIN) 300 MG capsule Take 600 mg by mouth at bedtime.  . insulin glargine (LANTUS) 100 UNIT/ML Solostar Pen Inject 68 Units into the skin at bedtime.  Marland Kitchen  metoprolol tartrate (LOPRESSOR) 50 MG tablet Take 1 tablet (50 mg total) by mouth 2 (two) times daily.  . montelukast (SINGULAIR) 10 MG tablet Take 10 mg by mouth every morning.  . nitroGLYCERIN (NITROSTAT) 0.4 MG SL tablet Place 1 tablet (0.4 mg total) under the tongue every 5 (five) minutes as needed for chest pain.  . polyethylene glycol (MIRALAX / GLYCOLAX) 17 g packet Take 17 g by mouth daily as needed for mild constipation.  . prazosin (MINIPRESS) 5 MG capsule Take 5 mg by mouth at bedtime as needed (nightmares).  . rivaroxaban (XARELTO) 20 MG TABS tablet Take 20 mg by mouth every morning.  . zolpidem (AMBIEN) 10 MG  tablet Take 10 mg by mouth at bedtime as needed for sleep.  . [DISCONTINUED] empagliflozin (JARDIANCE) 10 MG TABS tablet Take 10 mg by mouth daily before breakfast. (Patient not taking: No sig reported)   No facility-administered encounter medications on file as of 12/25/2020.     Review of Systems  Review of Systems  Constitutional: Negative.  Negative for fatigue and fever.  HENT: Negative.   Respiratory: Negative for cough and shortness of breath.   Cardiovascular: Negative.  Negative for chest pain, palpitations and leg swelling.  Gastrointestinal: Negative.   Allergic/Immunologic: Negative.   Neurological: Positive for headaches.  Hematological:       Recurrent DVT's  Psychiatric/Behavioral: Negative.        Physical Exam  BP (!) 154/69   Pulse 68   Temp (!) 97.2 F (36.2 C) (Temporal)   Ht 5\' 11"  (1.803 m)   Wt 241 lb (109.3 kg)   SpO2 96%   BMI 33.61 kg/m   Wt Readings from Last 5 Encounters:  12/25/20 241 lb (109.3 kg)  12/08/20 236 lb 14.4 oz (107.5 kg)  10/06/20 244 lb 6.4 oz (110.9 kg)  10/02/20 236 lb 15.9 oz (107.5 kg)  03/21/20 237 lb (107.5 kg)     Physical Exam Vitals and nursing note reviewed.  Constitutional:      General: He is not in acute distress.    Appearance: He is well-developed and well-nourished.  Cardiovascular:     Rate and Rhythm: Normal rate and regular rhythm.  Pulmonary:     Effort: Pulmonary effort is normal.     Breath sounds: Normal breath sounds.  Skin:    General: Skin is warm and dry.  Neurological:     Mental Status: He is alert and oriented to person, place, and time.  Psychiatric:        Mood and Affect: Mood and affect normal.       Imaging: X-ray chest PA and lateral  Result Date: 12/07/2020 CLINICAL DATA:  Syncope.  Hypertension. EXAM: CHEST - 2 VIEW COMPARISON:  August 30, 2019 FINDINGS: Lungs are clear. Heart size and pulmonary vascularity are normal. No adenopathy. No bone lesions. IMPRESSION: Lungs  clear.  Cardiac silhouette within normal limits. Electronically Signed   By: September 01, 2019 III M.D.   On: 12/07/2020 08:28   CT Head Wo Contrast  Result Date: 12/06/2020 CLINICAL DATA:  Hit head against solid object.  Dizziness. EXAM: CT HEAD WITHOUT CONTRAST TECHNIQUE: Contiguous axial images were obtained from the base of the skull through the vertex without intravenous contrast. COMPARISON:  November 11, 2006. FINDINGS: Brain: Ventricles and sulci are normal in size and configuration. There is no intracranial mass, hemorrhage, extra-axial fluid collection, or midline shift. The brain parenchyma appears unremarkable. There is no appreciable acute infarct. Vascular: No hyperdense  vessel. There are foci of calcification in the left carotid siphon. Skull: Bony calvarium appears intact. Sinuses/Orbits: There is opacification of a mid left ethmoid air cell. Mucosal thickening noted in several ethmoid air cells. Orbits appear symmetric bilaterally. Other: Mastoid air cells are clear. There is debris in the right external auditory canal. IMPRESSION: Brain parenchyma appears unremarkable.  No mass or hemorrhage. Left carotid siphon region calcification noted. Areas of paranasal sinus disease noted. Probable cerumen in the right external auditory canal. Electronically Signed   By: Bretta Bang III M.D.   On: 12/06/2020 14:14   MR BRAIN WO CONTRAST  Result Date: 12/06/2020 CLINICAL DATA:  Difficulty walking. Additional history provided: Headaches, dizziness since Thursday. EXAM: MRI HEAD WITHOUT CONTRAST TECHNIQUE: Multiplanar, multiecho pulse sequences of the brain and surrounding structures were obtained without intravenous contrast. COMPARISON:  Head CT 12/06/2020.  Head CT 04/24/2006. FINDINGS: Brain: Mild intermittent motion degradation. Cerebral volume is normal for age. Mild multifocal T2/FLAIR hyperintensity within the cerebral white matter is nonspecific, but compatible with chronic small vessel  ischemic disease. There is no acute infarct. No evidence of intracranial mass. No chronic intracranial blood products. No extra-axial fluid collection. No midline shift. Vascular: Expected proximal arterial flow voids. Skull and upper cervical spine: No focal marrow lesion. Sinuses/Orbits: Visualized orbits show no acute finding. Mild partial T2 hyperintense opacification of the left ethmoid sinus. Small right maxillary sinus mucous retention cyst. IMPRESSION: Mildly motion degraded exam. No evidence of acute intracranial abnormality, including acute infarction. Mild cerebral white matter chronic small vessel ischemic disease. Mild left ethmoid sinusitis. Small right maxillary sinus mucous retention cyst. Electronically Signed   By: Jackey Loge DO   On: 12/06/2020 15:54   ECHOCARDIOGRAM COMPLETE  Result Date: 12/07/2020    ECHOCARDIOGRAM REPORT   Patient Name:   Eric Cohen Date of Exam: 12/07/2020 Medical Rec #:  [id number]      Height:       71.0 in Accession #:    [id number]     Weight:       234.6 lb Date of Birth:  Mar 02, 1956      BSA:          2.256 m Patient Age:    65 years       BP:           156/81 mmHg Patient Gender: M              HR:           78 bpm. Exam Location:  Inpatient Procedure: 2D Echo, Color Doppler and Cardiac Doppler Indications:    R55 Syncope  History:        Patient has prior history of Echocardiogram examinations, most                 recent 08/21/2019. Risk Factors:Hypertension, Diabetes and                 Dyslipidemia.  Sonographer:    Irving Burton Senior RDCS Referring Phys: [id number] Kieth Brightly MACNEIL IMPRESSIONS  1. Left ventricular ejection fraction, by estimation, is 60 to 65%. The left ventricle has normal function. The left ventricle has no regional wall motion abnormalities. There is mild concentric left ventricular hypertrophy. Left ventricular diastolic parameters were normal.  2. Right ventricular systolic function is normal. The right ventricular size is normal.  3. The  mitral valve is normal in structure. Trivial mitral valve regurgitation.  4. The aortic valve was not well visualized. There  is mild calcification of the aortic valve. There is mild thickening of the aortic valve. Aortic valve regurgitation is trivial. Mild aortic valve sclerosis is present, with no evidence of aortic valve stenosis.  5. Aortic dilatation noted. There is mild dilatation of the ascending aorta, measuring 37 mm.  6. The inferior vena cava is normal in size with greater than 50% respiratory variability, suggesting right atrial pressure of 3 mmHg. Comparison(s): Compared to prior echo report in 2020 (could not see images), the LVEF now appears to be 60-65%. Otherwise no significant changes noted, however, prior echo report with limited detail. FINDINGS  Left Ventricle: Left ventricular ejection fraction, by estimation, is 60 to 65%. The left ventricle has normal function. The left ventricle has no regional wall motion abnormalities. The left ventricular internal cavity size was normal in size. There is  mild concentric left ventricular hypertrophy. Left ventricular diastolic parameters were normal. Right Ventricle: The right ventricular size is normal. Right vetricular wall thickness was not well visualized. Right ventricular systolic function is normal. Left Atrium: Left atrial size was normal in size. Right Atrium: Right atrial size was normal in size. Pericardium: There is no evidence of pericardial effusion. Mitral Valve: The mitral valve is normal in structure. There is mild thickening of the mitral valve leaflet(s). There is mild calcification of the mitral valve leaflet(s). Mild mitral annular calcification. Trivial mitral valve regurgitation. Tricuspid Valve: The tricuspid valve is normal in structure. Tricuspid valve regurgitation is trivial. Aortic Valve: The aortic valve was not well visualized. There is mild calcification of the aortic valve. There is mild thickening of the aortic valve.  Aortic valve regurgitation is trivial. Mild aortic valve sclerosis is present, with no evidence of aortic valve stenosis. Pulmonic Valve: The pulmonic valve was grossly normal. Pulmonic valve regurgitation is trivial. Aorta: Aortic dilatation noted. There is mild dilatation of the ascending aorta, measuring 37 mm. Venous: The inferior vena cava is normal in size with greater than 50% respiratory variability, suggesting right atrial pressure of 3 mmHg. IAS/Shunts: No atrial level shunt detected by color flow Doppler.  LEFT VENTRICLE PLAX 2D LVIDd:         4.00 cm  Diastology LVIDs:         2.40 cm  LV e' medial:    7.83 cm/s LV PW:         1.30 cm  LV E/e' medial:  12.2 LV IVS:        1.20 cm  LV e' lateral:   10.10 cm/s LVOT diam:     2.30 cm  LV E/e' lateral: 9.5 LV SV:         103 LV SV Index:   45 LVOT Area:     4.15 cm  RIGHT VENTRICLE RV S prime:     13.80 cm/s TAPSE (M-mode): 2.4 cm LEFT ATRIUM             Index       RIGHT ATRIUM           Index LA diam:        3.60 cm 1.60 cm/m  RA Area:     15.70 cm LA Vol (A2C):   59.5 ml 26.37 ml/m RA Volume:   35.90 ml  15.91 ml/m LA Vol (A4C):   57.3 ml 25.39 ml/m LA Biplane Vol: 60.4 ml 26.77 ml/m  AORTIC VALVE LVOT Vmax:   122.00 cm/s LVOT Vmean:  86.700 cm/s LVOT VTI:    0.247 m  AORTA Ao  Root diam: 3.10 cm Ao Asc diam:  3.70 cm MITRAL VALVE MV Area (PHT): 3.27 cm    SHUNTS MV Decel Time: 232 msec    Systemic VTI:  0.25 m MV E velocity: 95.50 cm/s  Systemic Diam: 2.30 cm MV A velocity: 86.50 cm/s MV E/A ratio:  1.10 Laurance Flatten MD Electronically signed by Laurance Flatten MD Signature Date/Time: 12/07/2020/11:56:38 AM    Final      Assessment & Plan:   History of COVID-19 DVT:   Stay well hydrated  Stay active  Deep breathing exercises   Recurrent DVT:  Will place referral to vascular    Headaches:  Keep upcoming appointment with neurology  Please keep headache log  May start magnesium 600 mg daily  May start riboflavin  400 mg daily    Follow up:  Follow up if needed      Ivonne Andrew, NP 12/25/2020

## 2020-12-25 NOTE — Assessment & Plan Note (Signed)
DVT:   Stay well hydrated  Stay active  Deep breathing exercises   Recurrent DVT:  Will place referral to vascular    Headaches:  Keep upcoming appointment with neurology  Please keep headache log  May start magnesium 600 mg daily  May start riboflavin 400 mg daily    Follow up:  Follow up if needed

## 2020-12-30 ENCOUNTER — Ambulatory Visit (HOSPITAL_COMMUNITY)
Admission: RE | Admit: 2020-12-30 | Discharge: 2020-12-30 | Disposition: A | Discharge status: Home / Self Care | Payer: 59 | Source: Ambulatory Visit | Attending: Family Medicine | Admitting: Family Medicine

## 2020-12-30 ENCOUNTER — Other Ambulatory Visit: Payer: Self-pay

## 2020-12-30 DIAGNOSIS — R55 Syncope and collapse: Secondary | ICD-10-CM | POA: Diagnosis not present

## 2020-12-30 NOTE — Procedures (Signed)
Patient Name: Eric Cohen  MRN: [id number]  Epilepsy Attending: Charlsie Quest  Referring Physician/Provider: Dr Leilani Able Date: 12/30/2020  Duration: 24.02 mins  Patient history: 65 year old male with syncope.  EEG to evaluate for seizures.  Level of alertness: Awake, asleep  AEDs during EEG study: None  Technical aspects: This EEG study was done with scalp electrodes positioned according to the 10-20 International system of electrode placement. Electrical activity was acquired at a sampling rate of 500Hz  and reviewed with a high frequency filter of 70Hz  and a low frequency filter of 1Hz . EEG data were recorded continuously and digitally stored.   Description: The posterior dominant rhythm consists of 8-9 Hz activity of moderate voltage (25-35 uV) seen predominantly in posterior head regions, symmetric and reactive to eye opening and eye closing.  Sleep was characterized by vertex waves, sleep spindles (12 to 14 Hz), maximal frontocentral region. Physiologic photic driving was not seen during photic stimulation.  Hyperventilation was not performed.     IMPRESSION: This study is within normal limits. No seizures or epileptiform discharges were seen throughout the recording.  Eric Cohen 

## 2020-12-30 NOTE — Progress Notes (Signed)
EEG completed, results pending. 

## 2021-01-15 ENCOUNTER — Encounter: Payer: 59 | Admitting: Vascular Surgery

## 2021-01-21 ENCOUNTER — Other Ambulatory Visit: Payer: Self-pay | Admitting: *Deleted

## 2021-01-21 DIAGNOSIS — I82403 Acute embolism and thrombosis of unspecified deep veins of lower extremity, bilateral: Secondary | ICD-10-CM

## 2021-01-29 ENCOUNTER — Encounter: Payer: Self-pay | Admitting: Vascular Surgery

## 2021-01-29 ENCOUNTER — Ambulatory Visit (INDEPENDENT_AMBULATORY_CARE_PROVIDER_SITE_OTHER): Payer: 59 | Admitting: Vascular Surgery

## 2021-01-29 ENCOUNTER — Other Ambulatory Visit: Payer: Self-pay

## 2021-01-29 ENCOUNTER — Ambulatory Visit (HOSPITAL_COMMUNITY)
Admission: RE | Admit: 2021-01-29 | Discharge: 2021-01-29 | Disposition: A | Discharge status: Home / Self Care | Payer: 59 | Source: Ambulatory Visit | Attending: Vascular Surgery | Admitting: Vascular Surgery

## 2021-01-29 VITALS — BP 123/74 | HR 74 | Temp 98.2°F | Resp 20 | Ht 71.0 in | Wt 243.0 lb

## 2021-01-29 DIAGNOSIS — M25561 Pain in right knee: Secondary | ICD-10-CM | POA: Diagnosis not present

## 2021-01-29 DIAGNOSIS — I82403 Acute embolism and thrombosis of unspecified deep veins of lower extremity, bilateral: Secondary | ICD-10-CM | POA: Diagnosis not present

## 2021-01-29 DIAGNOSIS — M25562 Pain in left knee: Secondary | ICD-10-CM

## 2021-01-29 NOTE — Progress Notes (Signed)
Referring Physician: Angus Seller NP  Patient name: Eric Cohen MRN: [id number] DOB: 12/18/1955 Sex: male  REASON FOR CONSULT: Leg pain  HPI: Eric Cohen is a 65 y.o. male, with bilateral calf pain.  Right leg is worse than the left.  He states that he develops pain in both calves with the first step out of bed in the morning.  This improves after standing for a few minutes.  He also states that he gets pain in both calves after walking about 1-2 blocks.  He states this is unpredictable.  Some days are better than others.  He does not have rest pain in his feet.  He has no history of nonhealing wounds.  He does not smoke.  He did apparently have a DVT in his leg after having Covid in February.  This was a DVT in the posterior tibial vein left leg.  Review of his record shows he had a calf DVT in April 2020 in the tibial veins was in the right leg.Marland Kitchen  He had been on Xarelto.  This has been since discontinued and he is currently on aspirin.  Other medical problems include diabetes, coronary artery disease, asthma, hyperlipidemia all of which are currently stable.  He is on Lipitor and amlodipine.  Past Medical History:  Diagnosis Date  . Asthma   . Coronary artery disease   . Depression   . Diabetes mellitus without complication (HCC)   . Diverticulitis   . DVT (deep venous thrombosis) (HCC)   . Heart disease   . Hyperlipidemia   . Hypertension    Past Surgical History:  Procedure Laterality Date  . APPENDECTOMY    . CHOLECYSTECTOMY    . COLOSTOMY REVERSAL    . KNEE SURGERY    . LEFT HEART CATH AND CORONARY ANGIOGRAPHY N/A 08/31/2019   Procedure: LEFT HEART CATH AND CORONARY ANGIOGRAPHY;  Surgeon: Yates Decamp, MD;  Location: [facility];  Service: Cardiovascular;  Laterality: N/A;    Family History  Problem Relation Age of Onset  . Heart failure Mother   . Diabetes Father   . Hyperlipidemia Father     SOCIAL HISTORY: Social History   Socioeconomic History  .  Marital status: Married    Spouse name: Not on file  . Number of children: 1  . Years of education: Not on file  . Highest education level: Not on file  Occupational History  . Not on file  Tobacco Use  . Smoking status: Never Smoker  . Smokeless tobacco: Never Used  Vaping Use  . Vaping Use: Never used  Substance and Sexual Activity  . Alcohol use: No  . Drug use: No  . Sexual activity: Not on file  Other Topics Concern  . Not on file  Social History Narrative  . Not on file   Social Determinants of Health   Financial Resource Strain: Not on file  Food Insecurity: Not on file  Transportation Needs: Not on file  Physical Activity: Not on file  Stress: Not on file  Social Connections: Not on file  Intimate Partner Violence: Not on file    Allergies  Allergen Reactions  . Shellfish Allergy Anaphylaxis    "close my throat"  . Lisinopril Cough    Current Outpatient Medications  Medication Sig Dispense Refill  . albuterol (PROVENTIL HFA;VENTOLIN HFA) 108 (90 BASE) MCG/ACT inhaler Inhale 2 puffs into the lungs every 6 (six) hours as needed for wheezing.    Marland Kitchen albuterol (  PROVENTIL) (2.5 MG/3ML) 0.083% nebulizer solution Take 2.5 mg by nebulization every 6 (six) hours as needed for wheezing or shortness of breath.    Marland Kitchen amLODipine (NORVASC) 10 MG tablet Take 1 tablet (10 mg total) by mouth daily. 30 tablet 0  . aspirin 81 MG EC tablet TAKE 1 TABLET BY MOUTH EVERY DAY (Patient taking differently: Take 81 mg by mouth every morning.) 90 tablet 1  . atorvastatin (LIPITOR) 80 MG tablet TAKE 1 TABLET (80 MG TOTAL) BY MOUTH DAILY AT 6 PM. 90 tablet 2  . azelastine (ASTELIN) 0.1 % nasal spray Place 1 spray into both nostrils 2 (two) times daily as needed (congestion). Use in each nostril as directed    . budesonide-formoterol (SYMBICORT) 160-4.5 MCG/ACT inhaler Inhale 2 puffs into the lungs 2 (two) times daily as needed (shortness of breath/wheezing).    . carbamazepine (TEGRETOL) 200  MG tablet Take 200 mg by mouth at bedtime.    . cetirizine (ZYRTEC) 10 MG tablet Take 10 mg by mouth at bedtime.    . clopidogrel (PLAVIX) 75 MG tablet TAKE 1 TABLET BY MOUTH EVERY DAY (Patient taking differently: Take 75 mg by mouth every morning.) 90 tablet 1  . doxepin (SINEQUAN) 75 MG capsule Take 75 mg by mouth at bedtime as needed (nightmares).    Marland Kitchen escitalopram (LEXAPRO) 10 MG tablet Take 10 mg by mouth every morning.    . ezetimibe (ZETIA) 10 MG tablet TAKE 1 TABLET BY MOUTH EVERY DAY (Patient taking differently: Take 10 mg by mouth daily.) 90 tablet 0  . fluticasone (FLONASE) 50 MCG/ACT nasal spray Place 1 spray into both nostrils daily as needed (congestion).    . gabapentin (NEURONTIN) 300 MG capsule Take 600 mg by mouth at bedtime.    . insulin glargine (LANTUS) 100 UNIT/ML Solostar Pen Inject 68 Units into the skin at bedtime.    . metoprolol tartrate (LOPRESSOR) 50 MG tablet Take 1 tablet (50 mg total) by mouth 2 (two) times daily. 30 tablet 0  . montelukast (SINGULAIR) 10 MG tablet Take 10 mg by mouth every morning.    . polyethylene glycol (MIRALAX / GLYCOLAX) 17 g packet Take 17 g by mouth daily as needed for mild constipation. 14 each 0  . prazosin (MINIPRESS) 5 MG capsule Take 5 mg by mouth at bedtime as needed (nightmares).    . nitroGLYCERIN (NITROSTAT) 0.4 MG SL tablet Place 1 tablet (0.4 mg total) under the tongue every 5 (five) minutes as needed for chest pain. 25 tablet 3   No current facility-administered medications for this visit.    ROS:   General:  No weight loss, Fever, chills  HEENT: No recent headaches, no nasal bleeding, no visual changes, no sore throat  Neurologic: No dizziness, blackouts, seizures. No recent symptoms of stroke or mini- stroke. No recent episodes of slurred speech, or temporary blindness.  Cardiac: No recent episodes of chest pain/pressure, no shortness of breath at rest.  No shortness of breath with exertion.  Denies history of atrial  fibrillation or irregular heartbeat  Vascular: No history of rest pain in feet.  No history of claudication.  No history of non-healing ulcer, No history of DVT   Pulmonary: No home oxygen, no productive cough, no hemoptysis,  No asthma or wheezing  Musculoskeletal:  [ ]  Arthritis, [X]  Low back pain,  [X]  Joint pain  Hematologic:No history of hypercoagulable state.  No history of easy bleeding.  No history of anemia  Gastrointestinal: No hematochezia or melena,  No gastroesophageal reflux, no trouble swallowing  Urinary: [ ]  chronic Kidney disease, [ ]  on HD - [ ]  MWF or [ ]  TTHS, [ ]  Burning with urination, [ ]  Frequent urination, [ ]  Difficulty urinating;   Skin: No rashes  Psychological: No history of anxiety,  No history of depression   Physical Examination  Vitals:   01/29/21 1453  BP: 123/74  Pulse: 74  Resp: 20  Temp: 98.2 F (36.8 C)  SpO2: 95%  Weight: 243 lb (110.2 kg)  Height: 5\' 11"  (1.803 m)    Body mass index is 33.89 kg/m.  General:  Alert and oriented, no acute distress HEENT: Normal Neck: No JVD Cardiac: Regular Rate and Rhythm Abdomen: Soft, non-tender, non-distended, no mass Skin: No rash Extremity Pulses:  2+ radial, brachial, femoral, dorsalis pedis, posterior tibial pulses bilaterally Musculoskeletal: No deformity or edema  Neurologic: Upper and lower extremity motor 5/5 and symmetric  DATA:  No venous duplex exam for reflux today.  This showed no evidence of DVT.  He did have mild reflux in the common femoral vein and at the saphenofemoral junction in both greater saphenous veins.  However the remainder of the saphenous vein was of normal diameter and no significant reflux.  ASSESSMENT: Bilateral leg pain no obvious vascular etiology.  He has mild reflux but usually reflux symptoms are felt at the end of the day not at the first step out of bed at the beginning of the day.  He does have a history of chronic back pain and not sure if this is  related.  Additionally he does take a statin which sometimes can cause leg cramping pain such as this.  As far as his prior Covid infection is concerned I am not sure whether or not this can cause any musculoskeletal long-term effects but certainly this could be a possibility as well.   PLAN: Patient will follow up with on an as-needed basis.   , MD [facility] Office: [phone]

## 2021-03-05 IMAGING — DX RIGHT TIBIA AND FIBULA - 2 VIEW
3 series · 3 of 3 positions shown · non-contrast
Comparison: None.

CLINICAL DATA: 63-year-old male with right lower extremity calf
pain since [DATE]. No known injury.

EXAM:
RIGHT TIBIA AND FIBULA - 2 VIEW

[tibia ap]
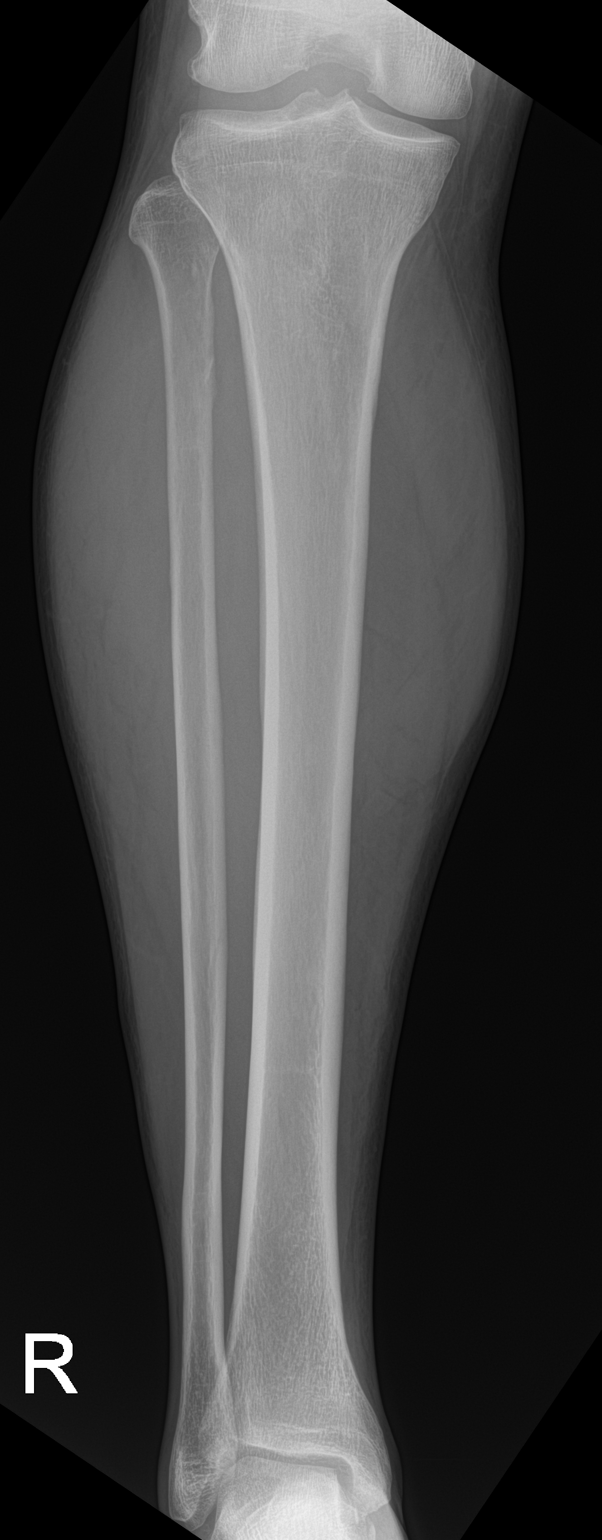

[tibia lat (1 of 2)]
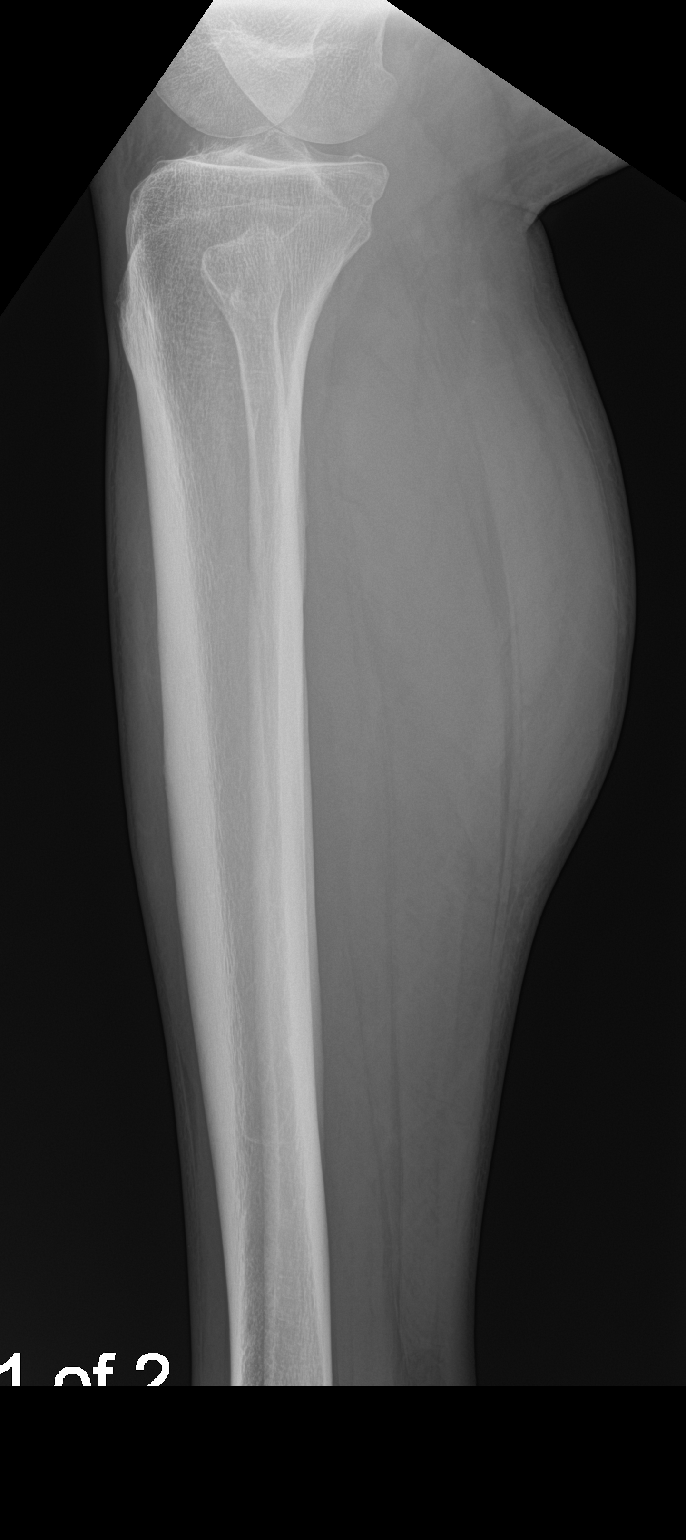

[tibia lat (2 of 2)]
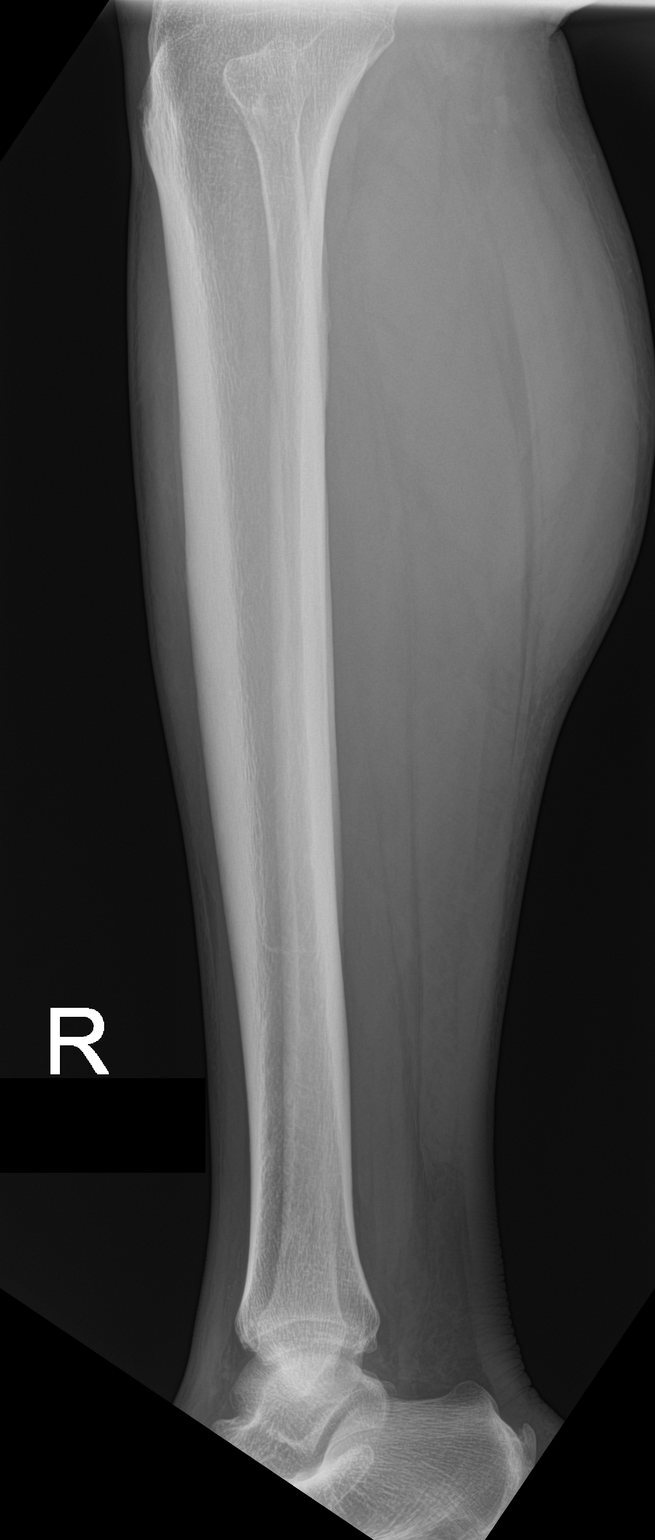

[3 of 3 positions shown; findings below may reference images not displayed]

FINDINGS: There is no evidence of fracture or other focal bone lesions. Soft
tissues are unremarkable.
IMPRESSION: Negative.

## 2021-04-06 ENCOUNTER — Ambulatory Visit: Payer: 59 | Admitting: Cardiology

## 2021-04-16 ENCOUNTER — Emergency Department (HOSPITAL_COMMUNITY)
Admission: EM | Admit: 2021-04-16 | Discharge: 2021-04-16 | Disposition: A | Discharge status: Home / Self Care | Payer: No Typology Code available for payment source | Attending: Emergency Medicine | Admitting: Emergency Medicine

## 2021-04-16 ENCOUNTER — Emergency Department (HOSPITAL_COMMUNITY): Payer: No Typology Code available for payment source

## 2021-04-16 ENCOUNTER — Encounter (HOSPITAL_COMMUNITY): Payer: Self-pay | Admitting: *Deleted

## 2021-04-16 DIAGNOSIS — Z7902 Long term (current) use of antithrombotics/antiplatelets: Secondary | ICD-10-CM | POA: Diagnosis not present

## 2021-04-16 DIAGNOSIS — J441 Chronic obstructive pulmonary disease with (acute) exacerbation: Secondary | ICD-10-CM | POA: Diagnosis not present

## 2021-04-16 DIAGNOSIS — I10 Essential (primary) hypertension: Secondary | ICD-10-CM | POA: Diagnosis not present

## 2021-04-16 DIAGNOSIS — J45909 Unspecified asthma, uncomplicated: Secondary | ICD-10-CM | POA: Diagnosis not present

## 2021-04-16 DIAGNOSIS — I2510 Atherosclerotic heart disease of native coronary artery without angina pectoris: Secondary | ICD-10-CM | POA: Diagnosis not present

## 2021-04-16 DIAGNOSIS — Z79899 Other long term (current) drug therapy: Secondary | ICD-10-CM | POA: Diagnosis not present

## 2021-04-16 DIAGNOSIS — J40 Bronchitis, not specified as acute or chronic: Secondary | ICD-10-CM | POA: Diagnosis not present

## 2021-04-16 DIAGNOSIS — Z7982 Long term (current) use of aspirin: Secondary | ICD-10-CM | POA: Insufficient documentation

## 2021-04-16 DIAGNOSIS — E119 Type 2 diabetes mellitus without complications: Secondary | ICD-10-CM | POA: Diagnosis not present

## 2021-04-16 DIAGNOSIS — Z794 Long term (current) use of insulin: Secondary | ICD-10-CM | POA: Diagnosis not present

## 2021-04-16 DIAGNOSIS — Z8616 Personal history of COVID-19: Secondary | ICD-10-CM | POA: Insufficient documentation

## 2021-04-16 DIAGNOSIS — R0602 Shortness of breath: Secondary | ICD-10-CM | POA: Diagnosis present

## 2021-04-16 LAB — CBC
HCT: 36.8 % — ABNORMAL LOW (ref 39.0–52.0)
Hemoglobin: 12.6 g/dL — ABNORMAL LOW (ref 13.0–17.0)
MCH: 30.3 pg (ref 26.0–34.0)
MCHC: 34.2 g/dL (ref 30.0–36.0)
MCV: 88.5 fL (ref 80.0–100.0)
Platelets: 229 K/uL (ref 150–400)
RBC: 4.16 MIL/uL — ABNORMAL LOW (ref 4.22–5.81)
RDW: 18.6 % — ABNORMAL HIGH (ref 11.5–15.5)
WBC: 10.1 K/uL (ref 4.0–10.5)
nRBC: 0 % (ref 0.0–0.2)

## 2021-04-16 LAB — COMPREHENSIVE METABOLIC PANEL
ALT: 42 U/L (ref 0–44)
AST: 43 U/L — ABNORMAL HIGH (ref 15–41)
Albumin: 4.8 g/dL (ref 3.5–5.0)
Alkaline Phosphatase: 76 U/L (ref 38–126)
Anion gap: 7 (ref 5–15)
BUN: 13 mg/dL (ref 8–23)
CO2: 29 mmol/L (ref 22–32)
Calcium: 9 mg/dL (ref 8.9–10.3)
Chloride: 100 mmol/L (ref 98–111)
Creatinine, Ser: 0.98 mg/dL (ref 0.61–1.24)
GFR, Estimated: >60 mL/min (ref >60)
Glucose, Bld: 261 mg/dL — ABNORMAL HIGH (ref 70–99)
Potassium: 3.8 mmol/L (ref 3.5–5.1)
Sodium: 136 mmol/L (ref 135–145)
Total Bilirubin: 1.2 mg/dL (ref 0.3–1.2)
Total Protein: 8.7 g/dL — ABNORMAL HIGH (ref 6.5–8.1)

## 2021-04-16 LAB — TROPONIN I (HIGH SENSITIVITY)
Troponin I (High Sensitivity): 3 ng/L (ref <18)
Troponin I (High Sensitivity): 3 ng/L (ref <18)

## 2021-04-16 MED ORDER — HYDROCODONE BIT-HOMATROP MBR 5-1.5 MG/5ML PO SOLN: 5.0000 mL | Freq: Four times a day (QID) | ORAL | 0 refills | Status: DC | PRN

## 2021-04-16 MED ORDER — DOXYCYCLINE HYCLATE 100 MG PO TABS
100.0000 mg | ORAL_TABLET | Freq: Once | ORAL | Status: AC
  Administered 2021-04-16: 100 mg via ORAL
  Filled 2021-04-16: qty 1

## 2021-04-16 MED ORDER — DOXYCYCLINE HYCLATE 100 MG PO CAPS: 100.0000 mg | ORAL_CAPSULE | Freq: Two times a day (BID) | ORAL | 0 refills | Status: DC

## 2021-04-16 MED ORDER — DEXAMETHASONE SODIUM PHOSPHATE 10 MG/ML IJ SOLN
10.0000 mg | Freq: Once | INTRAMUSCULAR | Status: AC
  Administered 2021-04-16: 10 mg via INTRAVENOUS
  Filled 2021-04-16: qty 1

## 2021-04-16 MED ORDER — ALBUTEROL SULFATE (2.5 MG/3ML) 0.083% IN NEBU: 2.5000 mg | INHALATION_SOLUTION | Freq: Four times a day (QID) | RESPIRATORY_TRACT | 2 refills | Status: AC | PRN

## 2021-04-16 MED ORDER — IPRATROPIUM-ALBUTEROL 0.5-2.5 (3) MG/3ML IN SOLN
3.0000 mL | Freq: Once | RESPIRATORY_TRACT | Status: AC
  Administered 2021-04-16: 3 mL via RESPIRATORY_TRACT
  Filled 2021-04-16: qty 3

## 2021-04-16 MED ORDER — ALBUTEROL SULFATE HFA 108 (90 BASE) MCG/ACT IN AERS
2.0000 | INHALATION_SPRAY | RESPIRATORY_TRACT | Status: DC | PRN
  Administered 2021-04-16: 2 via RESPIRATORY_TRACT
  Filled 2021-04-16: qty 6.7

## 2021-04-16 MED ORDER — PREDNISONE 20 MG PO TABS: 60.0000 mg | ORAL_TABLET | Freq: Every day | ORAL | 0 refills | Status: AC

## 2021-04-16 NOTE — ED Triage Notes (Signed)
Per EMS, pt from [place] reports cough and chest pain. [facility] gave nitro and aspirin. Chest pain resolved, still has cough.   BP 127/74 O2 98% Lungs clear in all fields.

## 2021-04-16 NOTE — ED Notes (Signed)
Pt continuing to state "I cant seem to get air in my lungs." and breathing is labored. Pt O2 sats 99% room air. Pt placed on 2L O2 [place] by this RN for comfort measures.

## 2021-04-16 NOTE — ED Provider Notes (Signed)
[facility] DEPT Provider Note   CSN: [id number] Arrival date & time: 04/16/21  7342     History Chief Complaint  Patient presents with   Shortness of Breath    Eric Cohen is a 65 y.o. male.  [name] has a history of asthma.  He is out of his nebulizer.  He developed a respiratory illness approximately 1 week ago, and he has been experiencing cough, chest pain with coughing, and shortness of breath.  He has been treated with cough syrup with codeine as well as a Z-Pak.  He went to the [place] clinic today and was sent to the emergency department.  The history is provided by the patient.  Cough Cough characteristics:  Non-productive Severity:  Moderate Onset quality:  Gradual Duration:  1 week Timing:  Intermittent Progression:  Worsening Chronicity:  New Smoker: no   Context: not sick contacts and not upper respiratory infection   Relieved by:  Beta-agonist inhaler and cough suppressants (azithromycin) Worsened by:  Nothing Ineffective treatments: azithromycin. 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[place]) 12/25/2020   History of COVID-19 12/25/2020   Syncope and collapse 12/06/2020   Hyperlipidemia 12/06/2020   Type 2 diabetes mellitus ([place]) 12/06/2020   CAD (coronary artery disease) 12/06/2020   AKI (acute kidney injury) ([place]) 12/06/2020   Headache 12/06/2020   Hyperglycemia due to type 2 diabetes mellitus ([place]) 12/06/2020    Unstable angina ([place]) 08/30/2019    Past Surgical History:  Procedure Laterality Date   APPENDECTOMY     CHOLECYSTECTOMY     COLOSTOMY REVERSAL     KNEE SURGERY     LEFT HEART CATH AND CORONARY ANGIOGRAPHY N/A 08/31/2019   Procedure: LEFT HEART CATH AND CORONARY ANGIOGRAPHY;  Surgeon: Adrian Prows, MD;  Location: [facility];  Service: Cardiovascular;  Laterality: N/A;       Family History  Problem Relation Age of Onset   Heart failure Mother    Diabetes Father    Hyperlipidemia Father     Social History   Tobacco Use   Smoking status: Never   Smokeless tobacco: Never  Vaping Use   Vaping Use: Never used  Substance Use Topics   Alcohol use: No   Drug use: No    Home Medications Prior to Admission medications   Medication Sig Start Date End Date Taking? Authorizing Provider  albuterol (PROVENTIL HFA;VENTOLIN HFA) 108 (90 BASE) MCG/ACT inhaler Inhale 2 puffs into the lungs every 6 (six) hours as needed for wheezing.    [provider]  albuterol (PROVENTIL) (2.5 MG/3ML) 0.083% nebulizer solution Take 2.5 mg by nebulization every 6 (six) hours as needed for wheezing or shortness of breath.    [provider]  amLODipine (NORVASC) 10 MG tablet Take 1 tablet (10 mg total) by mouth daily. 12/09/20   Regalado, Cassie Freer, MD  aspirin  81 MG EC tablet TAKE 1 TABLET BY MOUTH EVERY DAY Patient taking differently: Take 81 mg by mouth every morning. 09/06/19   Miquel Dunn, NP  atorvastatin (LIPITOR) 80 MG tablet TAKE 1 TABLET (80 MG TOTAL) BY MOUTH DAILY AT 6 PM. 02/20/20   Adrian Prows, MD  azelastine (ASTELIN) 0.1 % nasal spray Place 1 spray into both nostrils 2 (two) times daily as needed (congestion). Use in each nostril as directed    [provider]  budesonide-formoterol (SYMBICORT) 160-4.5 MCG/ACT inhaler Inhale 2 puffs into the lungs 2 (two) times daily as needed (shortness of breath/wheezing).    [provider]  carbamazepine  (TEGRETOL) 200 MG tablet Take 200 mg by mouth at bedtime.    [provider]  cetirizine (ZYRTEC) 10 MG tablet Take 10 mg by mouth at bedtime.    [provider]  clopidogrel (PLAVIX) 75 MG tablet TAKE 1 TABLET BY MOUTH EVERY DAY Patient taking differently: Take 75 mg by mouth every morning. 09/01/20   Tolia, Sunit, DO  doxepin (SINEQUAN) 75 MG capsule Take 75 mg by mouth at bedtime as needed (nightmares).    [provider]  escitalopram (LEXAPRO) 10 MG tablet Take 10 mg by mouth every morning.    [provider]  ezetimibe (ZETIA) 10 MG tablet TAKE 1 TABLET BY MOUTH EVERY DAY Patient taking differently: Take 10 mg by mouth daily. 10/31/20   Tolia, Sunit, DO  fluticasone (FLONASE) 50 MCG/ACT nasal spray Place 1 spray into both nostrils daily as needed (congestion).    [provider]  gabapentin (NEURONTIN) 300 MG capsule Take 600 mg by mouth at bedtime.    [provider]  insulin glargine (LANTUS) 100 UNIT/ML Solostar Pen Inject 68 Units into the skin at bedtime.    [provider]  metoprolol tartrate (LOPRESSOR) 50 MG tablet Take 1 tablet (50 mg total) by mouth 2 (two) times daily. 12/08/20   Regalado, Belkys A, MD  montelukast (SINGULAIR) 10 MG tablet Take 10 mg by mouth every morning.    [provider]  nitroGLYCERIN (NITROSTAT) 0.4 MG SL tablet Place 1 tablet (0.4 mg total) under the tongue every 5 (five) minutes as needed for chest pain. 10/06/20 01/04/21  Tolia, Sunit, DO  polyethylene glycol (MIRALAX / GLYCOLAX) 17 g packet Take 17 g by mouth daily as needed for mild constipation. 12/08/20   Regalado, Belkys A, MD  prazosin (MINIPRESS) 5 MG capsule Take 5 mg by mouth at bedtime as needed (nightmares).    [provider]    Allergies    Shellfish allergy and Lisinopril  Review of Systems   Review of Systems  Constitutional:  Negative for chills and fever.  HENT:  Negative for ear pain and sore throat.    Eyes:  Negative for pain and visual disturbance.  Respiratory:  Positive for cough and shortness of breath.   Cardiovascular:  Positive for chest pain (with cough). Negative for palpitations.  Gastrointestinal:  Negative for abdominal pain and vomiting.  Genitourinary:  Negative for dysuria and hematuria.  Musculoskeletal:  Negative for arthralgias and back pain.  Skin:  Negative for color change and rash.  Neurological:  Negative for seizures and syncope.  All other systems reviewed and are negative.  Physical Exam Updated Vital Signs BP (!) 169/84   Pulse 85   Temp 98.6 F (37 C) (Oral)   Resp 18   SpO2 98%   Physical Exam Vitals and nursing note reviewed.  Constitutional:  Appearance: He is well-developed.  HENT:     Head: Normocephalic and atraumatic.  Eyes:     Conjunctiva/sclera: Conjunctivae normal.  Cardiovascular:     Rate and Rhythm: Normal rate and regular rhythm.     Heart sounds: No murmur heard. Pulmonary:     Effort: Pulmonary effort is normal. No respiratory distress.     Breath sounds: Normal breath sounds.     Comments: Frequent coughing Abdominal:     Palpations: Abdomen is soft.     Tenderness: There is no abdominal tenderness.  Musculoskeletal:     Cervical back: Neck supple.  Skin:    General: Skin is warm and dry.  Neurological:     General: No focal deficit present.     Mental Status: He is alert.    ED Results / Procedures / Treatments   Labs (all labs ordered are listed, but only abnormal results are displayed) Labs Reviewed  CBC - Abnormal; Notable for the following components:      Result Value   RBC 4.16 (*)    Hemoglobin 12.6 (*)    HCT 36.8 (*)    RDW 18.6 (*)    All other components within normal limits  COMPREHENSIVE METABOLIC PANEL - Abnormal; Notable for the following components:   Glucose, Bld 261 (*)    Total Protein 8.7 (*)    AST 43 (*)    All other components within normal limits  TROPONIN I (HIGH SENSITIVITY)   TROPONIN I (HIGH SENSITIVITY)    EKG EKG Interpretation  Date/Time:  Thursday April 16 2021 10:55:02 EDT Ventricular Rate:  83 PR Interval:  198 QRS Duration: 91 QT Interval:  386 QTC Calculation: 454 R Axis:   19 Text Interpretation: Sinus rhythm Borderline T wave abnormalities normal axis No acute ischemia Confirmed by Lorre Munroe ([id number]) on 04/16/2021 11:05:02 AM  Radiology DG Chest 2 View  Result Date: 04/16/2021 CLINICAL DATA:  Shortness of breath. Ongoing cough and mid chest pain. Symptoms for 2 weeks. EXAM: CHEST - 2 VIEW COMPARISON:  12/07/2020 FINDINGS: Heart size normal. No focal consolidations or pleural effusions. No pulmonary edema. IMPRESSION: No active cardiopulmonary disease. Electronically Signed   By: Nolon Nations M.D.   On: 04/16/2021 11:01   CT Chest Wo Contrast  Result Date: 04/16/2021 CLINICAL DATA:  Chest pain and cough. EXAM: CT CHEST WITHOUT CONTRAST TECHNIQUE: Multidetector CT imaging of the chest was performed following the standard protocol without IV contrast. COMPARISON:  08/30/2019 and chest x-ray 04/16/2021 FINDINGS: Cardiovascular: The heart is normal in size. No pericardial effusion. Mild tortuosity of the thoracic aorta but no atherosclerotic calcifications. There are age advanced coronary artery calcifications noted. Mediastinum/Nodes: No mediastinal or hilar mass or adenopathy. Small scattered lymph nodes are stable. The esophagus is grossly normal. Lungs/Pleura: Examination is somewhat limited due to significant respiratory motion but no obvious pulmonary infiltrates or pleural effusions. Mild upper lobe centrilobular nodularity possibly reflecting respiratory bronchiolitis. Inflammatory process or atypical infection such as MAC would be another possibility. Upper Abdomen: No significant upper abdominal findings. Gallbladder is surgically absent. Musculoskeletal: No significant bony findings. IMPRESSION: 1. Limited examination due to significant  respiratory motion. 2. Mild upper lobe centrilobular nodularity possibly reflecting respiratory bronchiolitis. Inflammatory process or atypical infection such as MAC would be another possibility. 3. No mediastinal or hilar mass or adenopathy. 4. Age advanced coronary artery calcifications. 5. Aortic atherosclerosis. Aortic Atherosclerosis (ICD10-I70.0). Electronically Signed   By: Marijo Sanes M.D.   On: 04/16/2021 14:10  Procedures Procedures   Medications Ordered in ED Medications  albuterol (VENTOLIN HFA) 108 (90 Base) MCG/ACT inhaler 2 puff (2 puffs Inhalation Given 04/16/21 1056)  doxycycline (VIBRA-TABS) tablet 100 mg (has no administration in time range)  ipratropium-albuterol (DUONEB) 0.5-2.5 (3) MG/3ML nebulizer solution 3 mL (3 mLs Nebulization Given 04/16/21 1204)  dexamethasone (DECADRON) injection 10 mg (10 mg Intravenous Given 04/16/21 1204)    ED Course  I have reviewed the triage vital signs and the nursing notes.  Pertinent labs & imaging results that were available during my care of the patient were reviewed by me and considered in my medical decision making (see chart for details).    MDM Rules/Calculators/A&P                          [name] has a history of asthma and presents with ongoing symptoms from a lower respiratory illness involving mainly cough.  Because of some chest pain he was endorsing, he was sent from the [place] to the emergency department.  He was fully evaluated for ACS.  Troponins were both within normal limits.  Chest pain does not seem to be consistent with a cardiac source.  I did consider PE because he has a history of venous thromboembolism.  However, he was coughing extremely frequently, and this really seem to be related to a respiratory cause.  Chest x-ray was negative, and CT scan did not reveal any evidence of pneumonia.  I will treat him for complicated bronchitis with antibiotics.  He will be given prednisone, and he requested a refill of  his albuterol nebulizer solution. Final Clinical Impression(s) / ED Diagnoses Final diagnoses:  COPD exacerbation ([place])  Bronchitis    Rx / DC Orders ED Discharge Orders     None        Arnaldo Natal, MD 04/16/21 1501

## 2021-09-22 ENCOUNTER — Ambulatory Visit
Admission: RE | Admit: 2021-09-22 | Discharge: 2021-09-22 | Disposition: A | Discharge status: Home / Self Care | Payer: No Typology Code available for payment source | Source: Ambulatory Visit | Attending: Family Medicine | Admitting: Family Medicine

## 2021-09-22 ENCOUNTER — Other Ambulatory Visit: Payer: Self-pay

## 2021-09-22 ENCOUNTER — Other Ambulatory Visit: Payer: Self-pay | Admitting: Family Medicine

## 2021-09-22 DIAGNOSIS — R200 Anesthesia of skin: Secondary | ICD-10-CM

## 2022-02-15 IMAGING — CR DG LUMBAR SPINE 2-3V
3 series · 3 of 3 positions shown · non-contrast
Comparison: None.

CLINICAL DATA: Right buttock pain. No trauma.

EXAM:
DG HIP (WITH OR WITHOUT PELVIS) 2-3V RIGHT

[t pelvis a.p.]
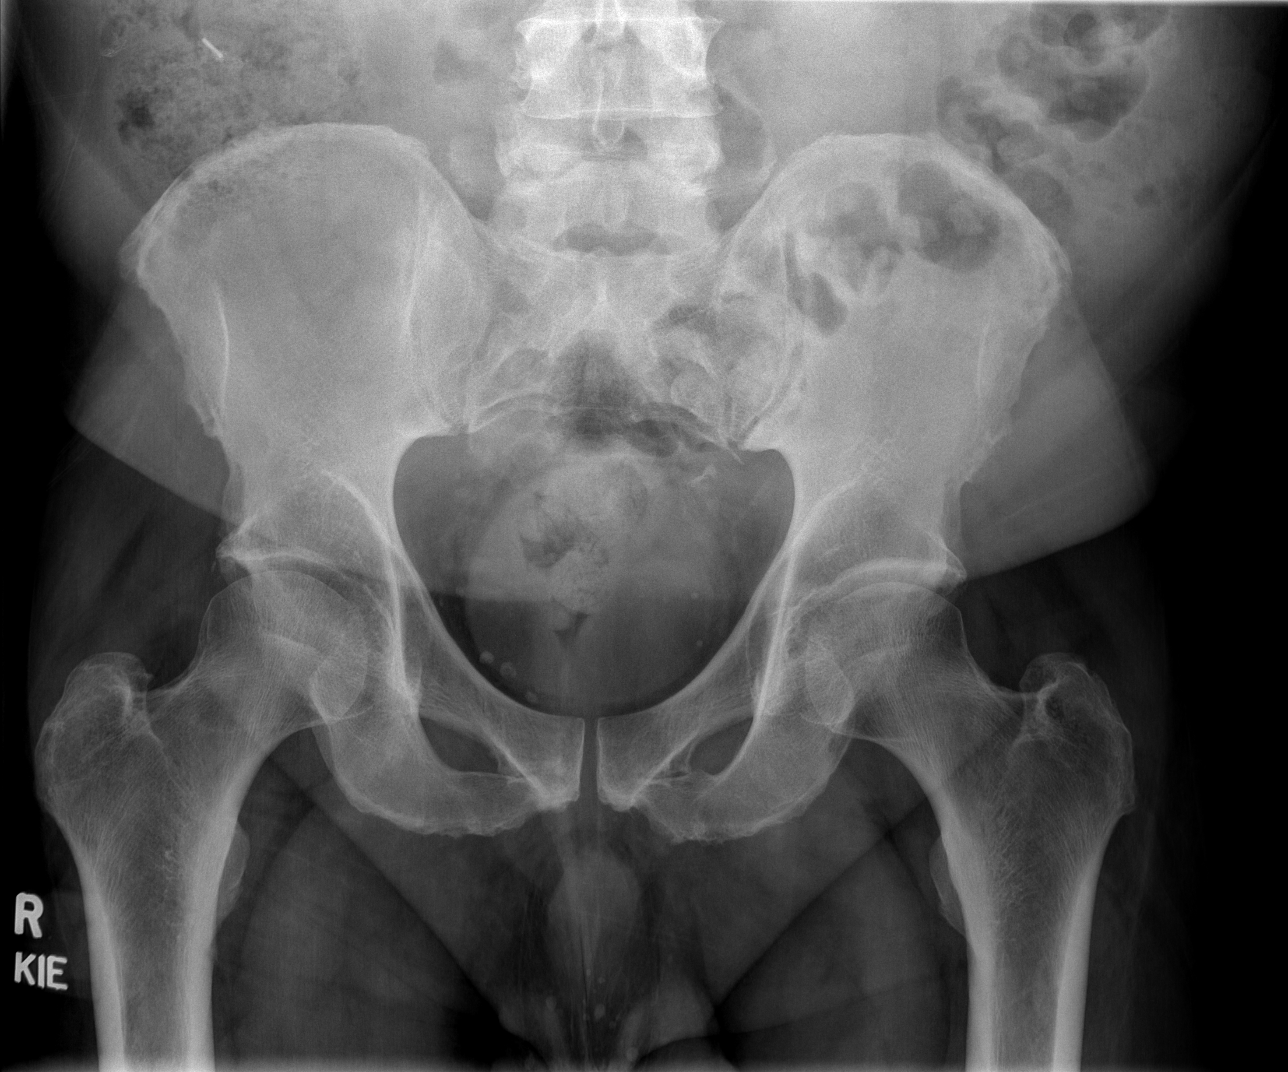

[t hip ap right]
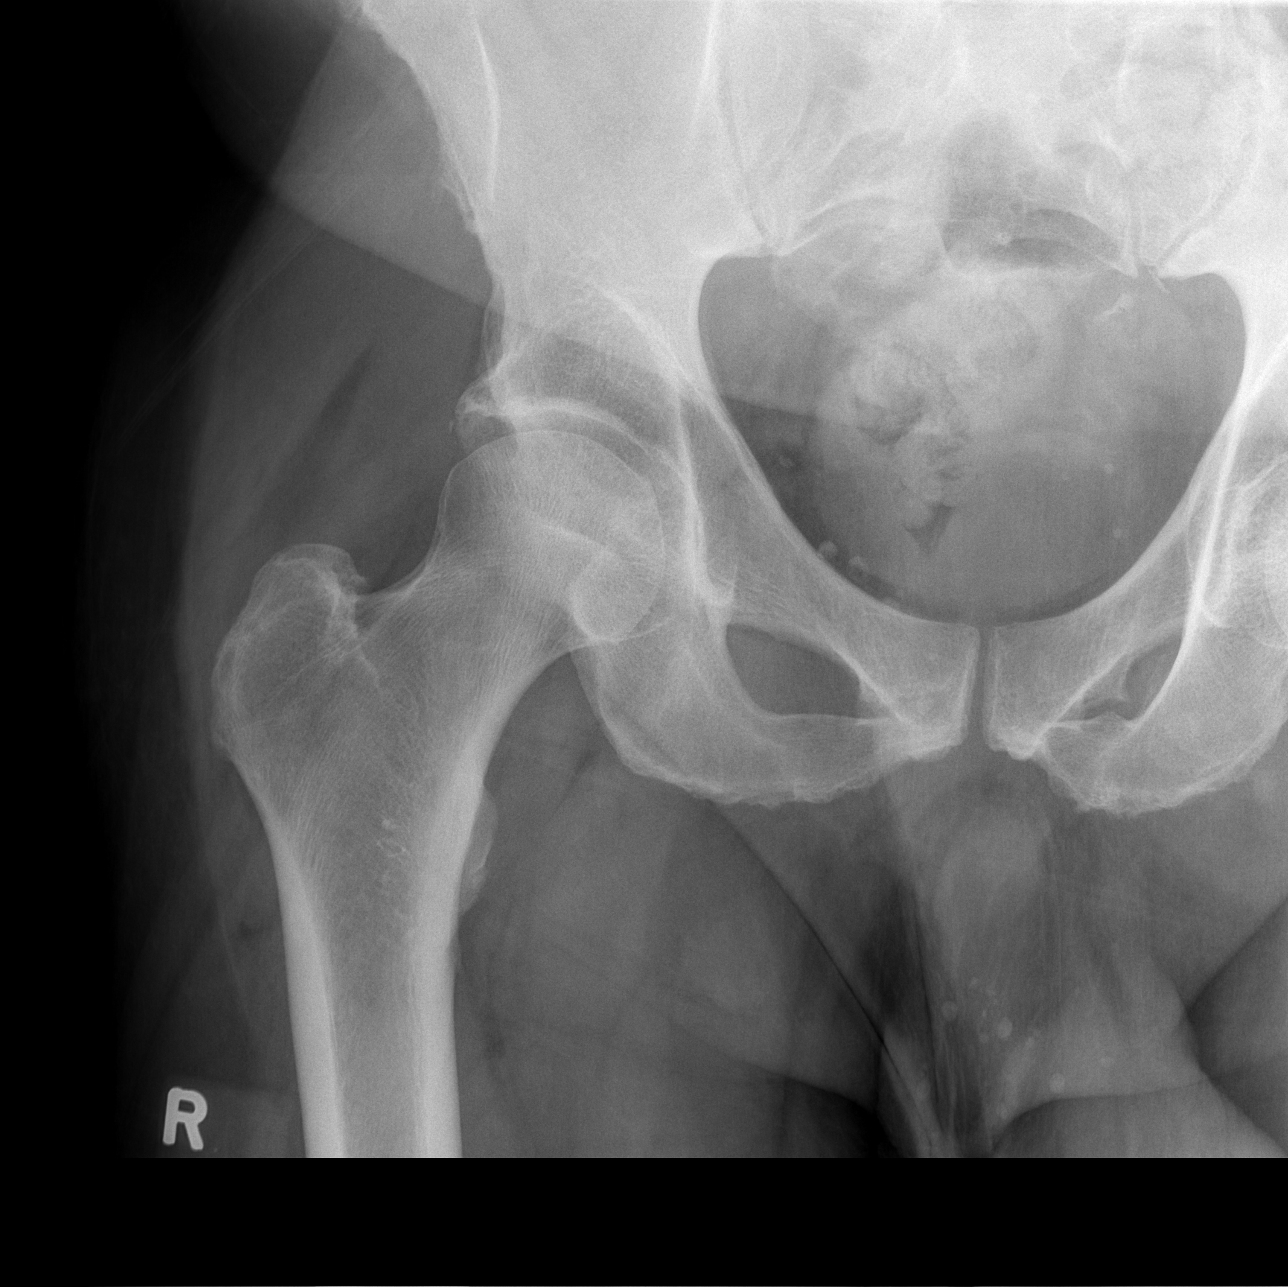

[t hip frog leg right]
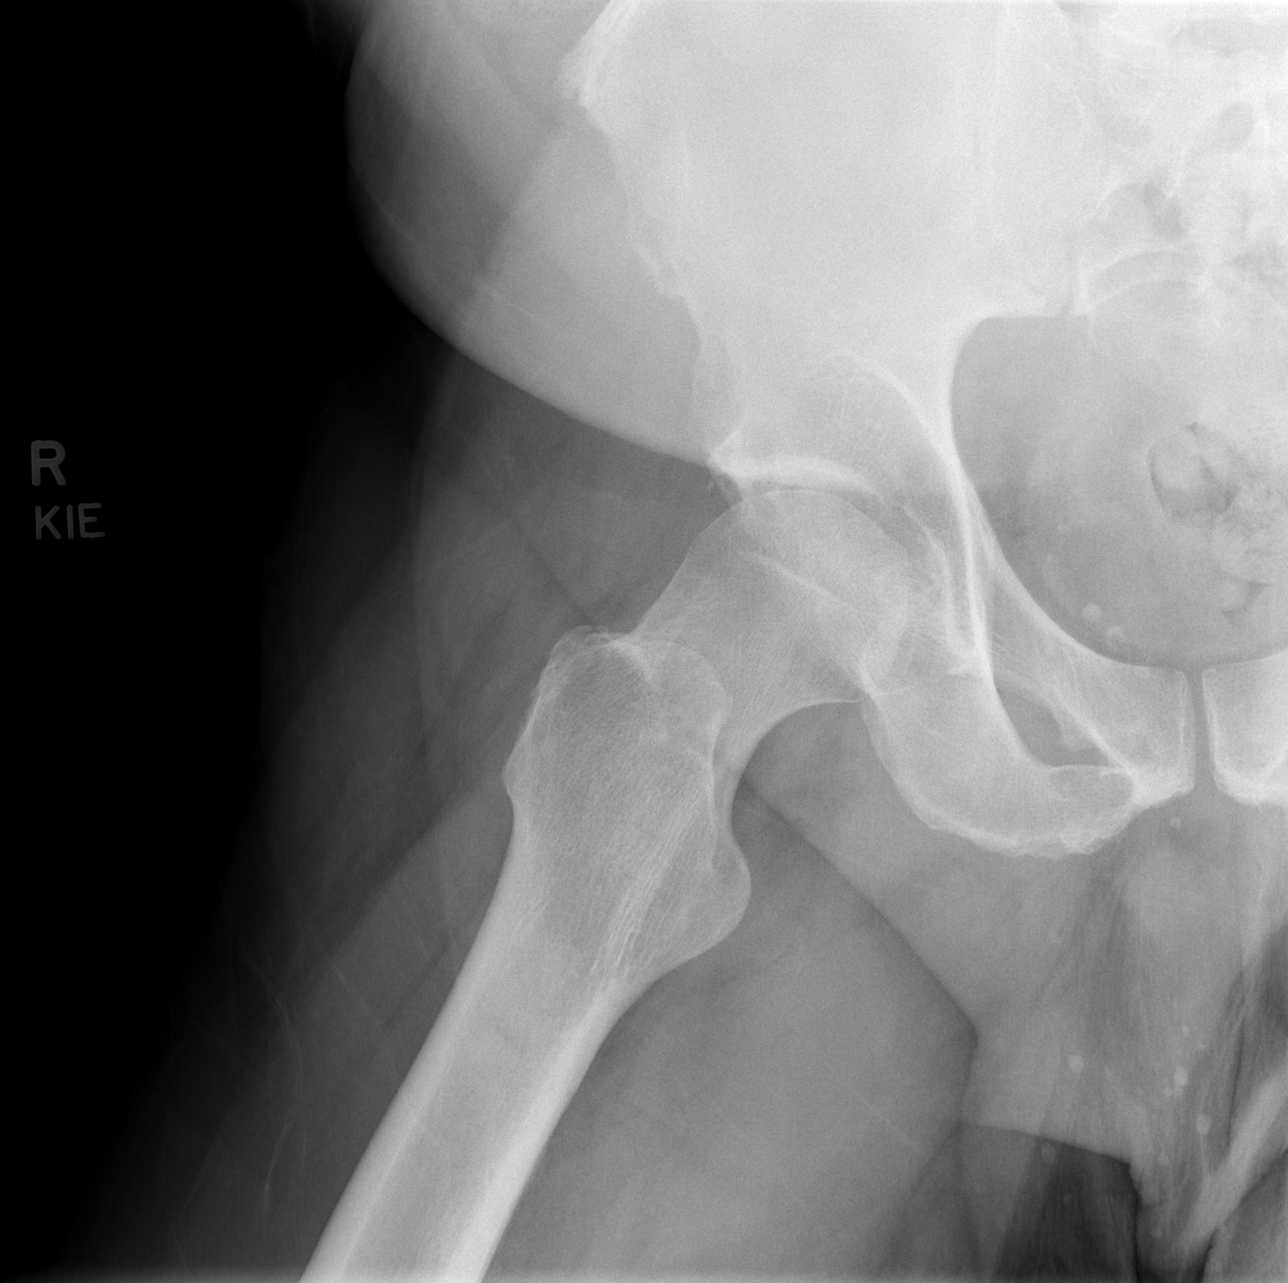

[3 of 3 positions shown; findings below may reference images not displayed]

FINDINGS: There is no evidence of hip fracture or dislocation. Mild narrowing
of bilateral hip joint spaces with osteophyte formation are noted.
IMPRESSION: Mild degenerative joint changes of bilateral hips.

## 2022-04-12 ENCOUNTER — Encounter: Payer: Self-pay | Admitting: Cardiology

## 2022-04-12 ENCOUNTER — Ambulatory Visit: Payer: 59 | Admitting: Cardiology

## 2022-04-12 VITALS — BP 137/89 | HR 93 | Temp 97.8°F | Resp 16 | Ht 71.0 in | Wt 236.0 lb

## 2022-04-12 DIAGNOSIS — I739 Peripheral vascular disease, unspecified: Secondary | ICD-10-CM

## 2022-04-12 DIAGNOSIS — I2510 Atherosclerotic heart disease of native coronary artery without angina pectoris: Secondary | ICD-10-CM

## 2022-04-12 DIAGNOSIS — Z86718 Personal history of other venous thrombosis and embolism: Secondary | ICD-10-CM

## 2022-04-12 DIAGNOSIS — E119 Type 2 diabetes mellitus without complications: Secondary | ICD-10-CM

## 2022-04-12 DIAGNOSIS — I10 Essential (primary) hypertension: Secondary | ICD-10-CM

## 2022-04-12 DIAGNOSIS — E6609 Other obesity due to excess calories: Secondary | ICD-10-CM

## 2022-04-12 DIAGNOSIS — E782 Mixed hyperlipidemia: Secondary | ICD-10-CM

## 2022-04-12 NOTE — Progress Notes (Signed)
Eric Cohen Date of Birth: [birth year] MRN: [id number] Primary Care Provider:Reese, Jocelyn Lamer, MD Former Cardiology Providers: Dr. Yates Decamp Primary Cardiologist: Tessa Lerner, DO (established care 02/08/2020)   Date: 04/12/22 Last Office Visit: 10/06/2020  Chief Complaint  Patient presents with   Coronary Artery Disease   Follow-up    Pain and burning in the legs.   HPI  Eric Cohen is a 66 y.o.  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  Patient is being followed by the practice for management of coronary artery disease.  He had a left heart catheterization in November 2020 and was noted to have single-vessel CAD along the second obtuse marginal branch.  The vessel is small in caliber and therefore did not undergo intervention.  He has been on antianginal therapy since then.  He now presents for follow-up 18 months later.  He denies anginal discomfort or heart failure symptoms.  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  He is not aware if he has had any hypercoagulable work-up.  I will defer that to his hematology for now.  He also has burning-like sensation in bilateral lower extremity in the infrapopliteal region.  This may be secondary to his underlying diabetes causing neuropathy.  Patient states that his hemoglobin A1c is well controlled and was recently checked at the [place].   With regards to claudication patient states that he does have pain upon walking involving the right thigh.  He does not complain of nonhealing wounds or ulcers.  No discoloration in the legs or toes.  In the past he has followed up with both vascular and vein specialist and also  [facility]   ALLERGIES: Allergies  Allergen Reactions   Shellfish Allergy Anaphylaxis    "close my throat"   Lisinopril Cough   MEDICATION LIST PRIOR TO VISIT: Current Outpatient Medications on File Prior to Visit  Medication Sig Dispense Refill   albuterol (PROVENTIL HFA;VENTOLIN HFA) 108 (90 BASE) MCG/ACT inhaler Inhale 2 puffs into the lungs every 6 (six) hours as needed for wheezing.     albuterol (PROVENTIL) (2.5 MG/3ML) 0.083% nebulizer solution Take 3 mLs (2.5 mg total) by nebulization every 6 (six) hours as needed for wheezing or shortness of breath. 75 mL 2   amLODipine (NORVASC) 10 MG tablet Take 1 tablet (10 mg total) by mouth daily. 30 tablet 0   aspirin 81 MG EC tablet TAKE 1 TABLET BY MOUTH EVERY DAY (Patient taking differently: Take 81 mg by mouth every morning.) 90 tablet 1   atorvastatin (LIPITOR) 80 MG tablet TAKE 1 TABLET (80 MG TOTAL) BY MOUTH DAILY AT 6 PM. 90 tablet 2   azelastine (ASTELIN) 0.1 % nasal spray Place 1 spray into both nostrils 2 (two) times daily as needed (congestion). Use in each nostril as directed     budesonide-formoterol (SYMBICORT) 160-4.5 MCG/ACT inhaler Inhale 2 puffs into the lungs 2 (two) times daily as needed (shortness of breath/wheezing).     carbamazepine (TEGRETOL) 200 MG tablet Take 200 mg by mouth at bedtime.     cetirizine (ZYRTEC) 10 MG tablet Take 10 mg by mouth at bedtime.     clopidogrel (PLAVIX) 75 MG tablet TAKE 1 TABLET BY MOUTH EVERY  DAY (Patient taking differently: Take 75 mg by mouth every morning.) 90 tablet 1   doxepin (SINEQUAN) 75 MG capsule Take 75 mg by mouth at bedtime as needed (nightmares).     doxycycline (VIBRAMYCIN) 100 MG capsule Take 1 capsule (100 mg total) by mouth 2 (two) times daily. 20 capsule 0   escitalopram (LEXAPRO) 10 MG tablet Take 10 mg by mouth every morning.     ezetimibe (ZETIA) 10 MG tablet TAKE 1 TABLET BY MOUTH EVERY DAY (Patient taking differently: Take 10 mg by mouth daily.)  90 tablet 0   fluticasone (FLONASE) 50 MCG/ACT nasal spray Place 1 spray into both nostrils daily as needed (congestion).     gabapentin (NEURONTIN) 300 MG capsule Take 600 mg by mouth at bedtime.     HYDROcodone bit-homatropine (HYCODAN) 5-1.5 MG/5ML syrup Take 5 mLs by mouth every 6 (six) hours as needed for cough. 120 mL 0   insulin glargine (LANTUS) 100 UNIT/ML Solostar Pen Inject 68 Units into the skin at bedtime.     metoprolol tartrate (LOPRESSOR) 50 MG tablet Take 1 tablet (50 mg total) by mouth 2 (two) times daily. 30 tablet 0   montelukast (SINGULAIR) 10 MG tablet Take 10 mg by mouth every morning.     nitroGLYCERIN (NITROSTAT) 0.4 MG SL tablet Place 1 tablet (0.4 mg total) under the tongue every 5 (five) minutes as needed for chest pain. 25 tablet 3   polyethylene glycol (MIRALAX / GLYCOLAX) 17 g packet Take 17 g by mouth daily as needed for mild constipation. 14 each 0   prazosin (MINIPRESS) 5 MG capsule Take 5 mg by mouth at bedtime as needed (nightmares).     Rivaroxaban Stater Pack, 15 mg and 20 mg, (XARELTO STARTER PACK) See admin instructions.     No current facility-administered medications on file prior to visit.    PAST MEDICAL HISTORY: Past Medical History:  Diagnosis Date   Asthma    Coronary artery disease    Depression    Diabetes mellitus without complication (HCC)    Diverticulitis    DVT (deep venous thrombosis) (HCC)    Heart disease    Hyperlipidemia    Hypertension     PAST SURGICAL HISTORY: Past Surgical History:  Procedure Laterality Date   APPENDECTOMY     CHOLECYSTECTOMY     COLOSTOMY REVERSAL     KNEE SURGERY     LEFT HEART CATH AND CORONARY ANGIOGRAPHY N/A 08/31/2019   Procedure: LEFT HEART CATH AND CORONARY ANGIOGRAPHY;  Surgeon: Yates Decamp, MD;  Location: [facility];  Service: Cardiovascular;  Laterality: N/A;    FAMILY HISTORY: The patient's family history includes Diabetes in his father; Heart failure in his mother;  Hyperlipidemia in his father.   SOCIAL HISTORY:  The patient  reports that he has never smoked. He has never used smokeless tobacco. He reports that he does not drink alcohol and does not use drugs.  Review of Systems  Cardiovascular:  Positive for claudication. Negative for chest pain, dyspnea on exertion, leg swelling, near-syncope, orthopnea, palpitations, paroxysmal nocturnal dyspnea and syncope.  Respiratory:  Negative for shortness of breath.   Neurological:  Positive for paresthesias.    PHYSICAL EXAM:    04/12/2022    9:54 AM 04/16/2021    3:08 PM 04/16/2021    2:04 PM  Vitals with BMI  Height 5\' 11"     Weight 236 lbs    BMI 32.93    Systolic 137 166  Diastolic  89 89 86  Pulse 93 89 79   CONSTITUTIONAL: Well-developed and well-nourished. No acute distress.  Ambulates with a cane. SKIN: Skin is warm and dry. No rash noted. No cyanosis. No pallor. No jaundice HEAD: Normocephalic and atraumatic.  EYES: No scleral icterus MOUTH/THROAT: Moist oral membranes.  NECK: No JVD present. No thyromegaly noted. No carotid bruits  CHEST Normal respiratory effort. No intercostal retractions  LUNGS: Clear to auscultation bilaterally.  No stridor. No wheezes. No rales.  CARDIOVASCULAR: Regular rate and rhythm, positive S1-S2, no murmurs rubs or gallops appreciated. ABDOMINAL: No apparent ascites.  EXTREMITIES: No peripheral edema, no discoloration /cyanosis, open wounds.  2+ bilateral dorsalis pedis pulses.  Faint bilateral PT pulses and popliteal pulses.  2+ bilateral femoral pulses. HEMATOLOGIC: No significant bruising NEUROLOGIC: Oriented to person, place, and time. Nonfocal. Normal muscle tone.  PSYCHIATRIC: Normal mood and affect. Normal behavior. Cooperative  CARDIAC DATABASE: EKG: 04/12/2022: NSR, 90 bpm, first-degree AV block, without underlying injury pattern.    Echocardiogram 08/21/2019: Left ventricle cavity is normal in size. Mild concentric hypertrophy of the left  ventricle. Normal LV systolic function with visual EF 50-55%. Normal global wall motion. Doppler evidence of grade I (impaired) diastolic dysfunction, normal LAP. Calculated EF 50%. Trileaflet aortic valve with mild aortic valve leaflet thickening. Mild (Grade I) aortic regurgitation. The IVC is not well visualized.  Stress Test: Lexiscan (Walking with Camelia Phenes) Sestamibi Stress Test 02/25/2020: Nondiagnostic ECG stress. Myocardial perfusion is normal. Overall LV systolic function is normal without regional wall motion abnormalities. Stress LV EF: 55%.  No previous exam available for comparison.  Left heart catheterization 08/31/2019: Right dominant circulation, no significant disease. Left main: Normal.  LAD: Mild diffuse disease, mid segment has a 20% stenosis.  Mild calcification is evident.  Circumflex: Mild disease in the proximal and mid segment.  OM 2 which is a small to moderate-sized vessel, has a acute angle with a 90% ostial stenosis. LV: Normal LVEDP, EF 55 to 65% without wall motion abnormality.  LABORATORY DATA:    Latest Ref Rng & Units 04/16/2021   11:01 AM 12/07/2020    3:29 AM 12/06/2020   12:23 PM  CBC  WBC 4.0 - 10.5 K/uL 10.1  10.0  8.8   Hemoglobin 13.0 - 17.0 g/dL 38.4  66.5  99.3   Hematocrit 39.0 - 52.0 % 36.8  32.1  35.6   Platelets 150 - 400 K/uL 229  217  235        Latest Ref Rng & Units 04/16/2021   11:01 AM 12/08/2020    2:23 AM 12/07/2020    3:29 AM  CMP  Glucose 70 - 99 mg/dL 570  177  939   BUN 8 - 23 mg/dL 13  22  32   Creatinine 0.61 - 1.24 mg/dL 0.30  0.92  3.30   Sodium 135 - 145 mmol/L 136  139  139   Potassium 3.5 - 5.1 mmol/L 3.8  3.6  4.5   Chloride 98 - 111 mmol/L 100  104  104   CO2 22 - 32 mmol/L 29  25  26    Calcium 8.9 - 10.3 mg/dL 9.0  8.8  9.0   Total Protein 6.5 - 8.1 g/dL 8.7   7.1   Total Bilirubin 0.3 - 1.2 mg/dL 1.2   0.8   Alkaline Phos 38 - 126 U/L 76   83   AST 15 - 41 U/L 43   20   ALT 0 - 44 U/L  42   24     Lipid  Panel  Lab Results  Component Value Date   CHOL 122 02/07/2020   HDL 35 (L) 02/07/2020   LDLCALC 60 02/07/2020   LDLDIRECT 115 (H) 10/31/2019   TRIG 156 (H) 02/07/2020   CHOLHDL 5.0 08/31/2019   Lab Results  Component Value Date   HGBA1C 8.7 (H) 12/07/2020   HGBA1C 8.3 (H) 08/31/2019   No components found for: "NTPROBNP" Lab Results  Component Value Date   TSH 0.691 08/31/2019    External Labs: Collected: Mar 11, 2022 [facility] Everywhere Hemoglobin 13 g/dL, hematocrit 69.6%. Hemoglobin A1c 7.3 Creatinine 1.18, BUN 15. Sodium 135, potassium 4.1, chloride 101, bicarb 28  FINAL MEDICATION LIST END OF ENCOUNTER: No orders of the defined types were placed in this encounter.   There are no discontinued medications.    Current Outpatient Medications:    albuterol (PROVENTIL HFA;VENTOLIN HFA) 108 (90 BASE) MCG/ACT inhaler, Inhale 2 puffs into the lungs every 6 (six) hours as needed for wheezing., Disp: , Rfl:    albuterol (PROVENTIL) (2.5 MG/3ML) 0.083% nebulizer solution, Take 3 mLs (2.5 mg total) by nebulization every 6 (six) hours as needed for wheezing or shortness of breath., Disp: 75 mL, Rfl: 2   amLODipine (NORVASC) 10 MG tablet, Take 1 tablet (10 mg total) by mouth daily., Disp: 30 tablet, Rfl: 0   aspirin 81 MG EC tablet, TAKE 1 TABLET BY MOUTH EVERY DAY (Patient taking differently: Take 81 mg by mouth every morning.), Disp: 90 tablet, Rfl: 1   atorvastatin (LIPITOR) 80 MG tablet, TAKE 1 TABLET (80 MG TOTAL) BY MOUTH DAILY AT 6 PM., Disp: 90 tablet, Rfl: 2   azelastine (ASTELIN) 0.1 % nasal spray, Place 1 spray into both nostrils 2 (two) times daily as needed (congestion). Use in each nostril as directed, Disp: , Rfl:    budesonide-formoterol (SYMBICORT) 160-4.5 MCG/ACT inhaler, Inhale 2 puffs into the lungs 2 (two) times daily as needed (shortness of breath/wheezing)., Disp: , Rfl:    carbamazepine (TEGRETOL) 200 MG tablet, Take 200 mg by mouth at bedtime.,  Disp: , Rfl:    cetirizine (ZYRTEC) 10 MG tablet, Take 10 mg by mouth at bedtime., Disp: , Rfl:    clopidogrel (PLAVIX) 75 MG tablet, TAKE 1 TABLET BY MOUTH EVERY DAY (Patient taking differently: Take 75 mg by mouth every morning.), Disp: 90 tablet, Rfl: 1   doxepin (SINEQUAN) 75 MG capsule, Take 75 mg by mouth at bedtime as needed (nightmares)., Disp: , Rfl:    doxycycline (VIBRAMYCIN) 100 MG capsule, Take 1 capsule (100 mg total) by mouth 2 (two) times daily., Disp: 20 capsule, Rfl: 0   escitalopram (LEXAPRO) 10 MG tablet, Take 10 mg by mouth every morning., Disp: , Rfl:    ezetimibe (ZETIA) 10 MG tablet, TAKE 1 TABLET BY MOUTH EVERY DAY (Patient taking differently: Take 10 mg by mouth daily.), Disp: 90 tablet, Rfl: 0   fluticasone (FLONASE) 50 MCG/ACT nasal spray, Place 1 spray into both nostrils daily as needed (congestion)., Disp: , Rfl:    gabapentin (NEURONTIN) 300 MG capsule, Take 600 mg by mouth at bedtime., Disp: , Rfl:    HYDROcodone bit-homatropine (HYCODAN) 5-1.5 MG/5ML syrup, Take 5 mLs by mouth every 6 (six) hours as needed for cough., Disp: 120 mL, Rfl: 0   insulin glargine (LANTUS) 100 UNIT/ML Solostar Pen, Inject 68 Units into the skin at bedtime., Disp: , Rfl:    metoprolol tartrate (LOPRESSOR) 50 MG tablet,  Take 1 tablet (50 mg total) by mouth 2 (two) times daily., Disp: 30 tablet, Rfl: 0   montelukast (SINGULAIR) 10 MG tablet, Take 10 mg by mouth every morning., Disp: , Rfl:    nitroGLYCERIN (NITROSTAT) 0.4 MG SL tablet, Place 1 tablet (0.4 mg total) under the tongue every 5 (five) minutes as needed for chest pain., Disp: 25 tablet, Rfl: 3   polyethylene glycol (MIRALAX / GLYCOLAX) 17 g packet, Take 17 g by mouth daily as needed for mild constipation., Disp: 14 each, Rfl: 0   prazosin (MINIPRESS) 5 MG capsule, Take 5 mg by mouth at bedtime as needed (nightmares)., Disp: , Rfl:    Rivaroxaban Stater Pack, 15 mg and 20 mg, (XARELTO STARTER PACK), See admin instructions., Disp: ,  Rfl:   IMPRESSION:    ICD-10-CM   1. Atherosclerosis of native coronary artery of native heart without angina pectoris  I25.10 EKG 12-Lead    PCV ECHOCARDIOGRAM COMPLETE    2. Claudication (HCC)  I73.9     3. Essential hypertension  I10     4. Type 2 diabetes mellitus without complication, with long-term current use of insulin (HCC)  E11.9    Z79.4     5. Mixed hyperlipidemia  E78.2     6. History of DVT (deep vein thrombosis)  Z86.718     7. Class 1 obesity due to excess calories with serious comorbidity and body mass index (BMI) of 32.0 to 32.9 in adult  E66.09    Z68.32        RECOMMENDATIONS: Eric InglesJerome A Cohen is a 66 y.o. 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  Atherosclerosis of native coronary artery of native heart without angina pectoris [facility](HCC) Denies angina pectoris. Has not used sublingual nitroglycerin tablets.  Requesting a refill. EKG today shows sinus rhythm without underlying injury pattern. Last MPI from May 2021 independently reviewed as part of today's office visit. Echo will be ordered to evaluate for structural heart disease and left ventricular systolic function.  Claudication [facility](HCC) Has intermittent claudication as well as burning sensation in bilateral lower extremities. I suspect this is multifactorial with possible PAD and neuropathy due to diabetes. He is already established care with vascular and vein I have asked him to reach out to have it evaluated with there practice.  If he has difficulty obtaining an appointment would recommend starting a work-up with ABIs and lower extremity arterial duplex.  Patient states that he will follow-up with vascular and vein specialist. Currently on dual antiplatelet therapy. Continue atorvastatin and Zetia. Patient is a non-smoker. No  evidence of critical limb ischemia Educated on the importance of glycemic control.  I have asked him to discuss diabetic neuropathy with PCP and/or podiatrist that he may be following.  Essential hypertension Office blood pressures are well controlled. Medications reconciled.  Type 2 diabetes mellitus without complication, with long-term current use of insulin (HCC) Currently on insulin. Outside labs independently reviewed as noted above. We will defer management to primary team Allergic to lisinopril due to cough. Recommend that he speak to his PCP or diabetic doctor for trail of losartan for renal protection. Patient agrees.    Mixed hyperlipidemia Currently on atorvastatin 80 mg p.o. nightly and Zetia 10 mg p.o. nightly Does not complain of lower extremity muscle cramps. Outside labs independently reviewed and noted above for further reference.  History of  DVT (deep vein thrombosis) Currently on Xarelto. Anticoagulation managed by other providers and his care team. I have asked him to follow-up with hematology oncology to see if there is underlying etiology for his recurrent DVTs.  Class 1 obesity due to excess calories with serious comorbidity and body mass index (BMI) of 32.0 to 32.9 in adult Body mass index is 32.92 kg/m. I reviewed with the patient the importance of diet, regular physical activity/exercise, weight loss.   Patient is educated on increasing physical activity gradually as tolerated.  With the goal of moderate intensity exercise for 30 minutes a day 5 days a week.  As part of today's office visit discussed management of at least 2 chronic comorbid conditions, independently ordered and reviewed EKG, outside labs independently reviewed from Care Everywhere and noted above for further reference, prior echo and stress test results reviewed with the patient.  Plan of care discussed in detail and his questions and concerns addressed to his satisfaction.  Orders Placed  This Encounter  Procedures   EKG 12-Lead   PCV ECHOCARDIOGRAM COMPLETE   --Continue cardiac medications as reconciled in final medication list. --Return in about 3 months (around 07/13/2022) for Follow up, CAD and claudication. Or sooner if needed. --Continue follow-up with your primary care physician regarding the management of your other chronic comorbid conditions.  This note was created using a voice recognition software as a result there may be grammatical errors inadvertently enclosed that do not reflect the nature of this encounter. Every attempt is made to correct such errors.  Tessa Lerner, [place], [facility]  Pager: [phone] Office: [phone]

## 2022-05-05 ENCOUNTER — Other Ambulatory Visit: Payer: Self-pay | Admitting: *Deleted

## 2022-05-05 ENCOUNTER — Other Ambulatory Visit: Payer: 59

## 2022-05-05 DIAGNOSIS — M25561 Pain in right knee: Secondary | ICD-10-CM

## 2022-05-24 NOTE — Progress Notes (Unsigned)
[facility] [place]  ASSESSMENT / PLAN: Eric Cohen is a 66 y.o. 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 I recommended he should be maintained on DOAC therapy indefinitely. I counseled him that there are no interventions available that will reduce his risk of recurrence. He does not have any chronic venous pathology that would merit endovascular intervention at the moment. He can follow-up with me as needed.  CHIEF COMPLAINT: Recurrent deep venous thrombosis  HISTORY OF PRESENT ILLNESS: Eric Cohen is a 66 y.o. male with recurrent bilateral lower extremity deep venous thrombosis.  Patient was previously seen by [facility] specialist, but was referred to my office for reasons that are unclear to both him and me.  We reviewed his history together.  He has had multiple lower extremity deep venous thromboses after COVID infection in early 2020.  Since then he has suffered from many of the symptoms of long-term COVID infection.  He has been maintained on a DOAC therapy over the past several months.  Report significant swelling in his lower extremities.  He is compliant with compression, which is likely minimizing the swelling.  He does elevate his legs every day.  He is easy fatigability with his long COVID symptoms.   Past Medical History:  Diagnosis Date   Asthma    Coronary artery disease    Depression    Diabetes mellitus without complication (HCC)    Diverticulitis    DVT (deep venous thrombosis) (HCC)    Heart disease    Hyperlipidemia    Hypertension     Past Surgical History:  Procedure Laterality Date   APPENDECTOMY     CHOLECYSTECTOMY     COLOSTOMY REVERSAL     KNEE SURGERY     LEFT HEART CATH AND CORONARY ANGIOGRAPHY N/A 08/31/2019   Procedure: LEFT HEART CATH AND CORONARY ANGIOGRAPHY;  Surgeon: Yates Decamp, MD;  Location: [facility];  Service: Cardiovascular;  Laterality: N/A;    Family  History  Problem Relation Age of Onset   Heart failure Mother    Diabetes Father    Hyperlipidemia Father     Social History   Socioeconomic History   Marital status: Married    Spouse name: Not on file   Number of children: 1   Years of education: Not on file   Highest education level: Not on file  Occupational History   Not on file  Tobacco Use   Smoking status: Never   Smokeless tobacco: Never  Vaping Use   Vaping Use: Never used  Substance and Sexual Activity   Alcohol use: No   Drug use: No   Sexual activity: Not on file  Other Topics Concern   Not on file  Social History Narrative   Not on file   Social Determinants of Health   Financial Resource Strain: Not on file  Food Insecurity: Not on file  Transportation Needs: Not on file  Physical Activity: Not on file  Stress: Not on file  Social Connections: Not on file  Intimate Partner Violence: Not on file    Allergies  Allergen Reactions   Shellfish Allergy Anaphylaxis    "close my throat"   Lisinopril Cough    Current Outpatient Medications  Medication Sig Dispense Refill   albuterol (PROVENTIL HFA;VENTOLIN HFA) 108 (90 BASE) MCG/ACT inhaler Inhale 2 puffs into the lungs every 6 (six) hours as needed for wheezing.     albuterol (PROVENTIL) (2.5 MG/3ML)  0.083% nebulizer solution Take 3 mLs (2.5 mg total) by nebulization every 6 (six) hours as needed for wheezing or shortness of breath. 75 mL 2   amLODipine (NORVASC) 10 MG tablet Take 1 tablet (10 mg total) by mouth daily. 30 tablet 0   aspirin 81 MG EC tablet TAKE 1 TABLET BY MOUTH EVERY DAY (Patient taking differently: Take 81 mg by mouth every morning.) 90 tablet 1   atorvastatin (LIPITOR) 80 MG tablet TAKE 1 TABLET (80 MG TOTAL) BY MOUTH DAILY AT 6 PM. 90 tablet 2   azelastine (ASTELIN) 0.1 % nasal spray Place 1 spray into both nostrils 2 (two) times daily as needed (congestion). Use in each nostril as directed     budesonide-formoterol (SYMBICORT)  160-4.5 MCG/ACT inhaler Inhale 2 puffs into the lungs 2 (two) times daily as needed (shortness of breath/wheezing).     carbamazepine (TEGRETOL) 200 MG tablet Take 200 mg by mouth at bedtime.     cetirizine (ZYRTEC) 10 MG tablet Take 10 mg by mouth at bedtime.     clopidogrel (PLAVIX) 75 MG tablet TAKE 1 TABLET BY MOUTH EVERY DAY (Patient taking differently: Take 75 mg by mouth every morning.) 90 tablet 1   doxepin (SINEQUAN) 50 MG capsule Take 50 mg by mouth daily.     doxepin (SINEQUAN) 75 MG capsule Take 75 mg by mouth at bedtime as needed (nightmares).     doxycycline (VIBRAMYCIN) 100 MG capsule Take 1 capsule (100 mg total) by mouth 2 (two) times daily. 20 capsule 0   escitalopram (LEXAPRO) 10 MG tablet Take 10 mg by mouth every morning.     ezetimibe (ZETIA) 10 MG tablet TAKE 1 TABLET BY MOUTH EVERY DAY (Patient taking differently: Take 10 mg by mouth daily.) 90 tablet 0   fluticasone (FLONASE) 50 MCG/ACT nasal spray Place 1 spray into both nostrils daily as needed (congestion).     gabapentin (NEURONTIN) 300 MG capsule Take 600 mg by mouth at bedtime.     insulin glargine (LANTUS) 100 UNIT/ML Solostar Pen Inject 68 Units into the skin at bedtime.     metoprolol tartrate (LOPRESSOR) 50 MG tablet Take 1 tablet (50 mg total) by mouth 2 (two) times daily. 30 tablet 0   montelukast (SINGULAIR) 10 MG tablet Take 10 mg by mouth every morning.     polyethylene glycol (MIRALAX / GLYCOLAX) 17 g packet Take 17 g by mouth daily as needed for mild constipation. 14 each 0   prazosin (MINIPRESS) 5 MG capsule Take 5 mg by mouth at bedtime as needed (nightmares).     HYDROcodone bit-homatropine (HYCODAN) 5-1.5 MG/5ML syrup Take 5 mLs by mouth every 6 (six) hours as needed for cough. (Patient not taking: Reported on 05/25/2022) 120 mL 0   nitroGLYCERIN (NITROSTAT) 0.4 MG SL tablet Place 1 tablet (0.4 mg total) under the tongue every 5 (five) minutes as needed for chest pain. 25 tablet 3   Rivaroxaban Stater  Pack, 15 mg and 20 mg, (XARELTO STARTER PACK) See admin instructions. (Patient not taking: Reported on 05/25/2022)     No current facility-administered medications for this visit.    PHYSICAL EXAM Vitals:   05/25/22 1248  BP: 125/78  Pulse: 63  Resp: 20  Temp: 98.4 F (36.9 C)  SpO2: 92%  Weight: 239 lb (108.4 kg)  Height: 5\' 11"  (1.803 m)    Well-appearing man in no acute distress Regular rate and rhythm Unlabored breathing Bilateral lower extremities without edema.  Weakly palpable dorsalis pedis  pulses bilaterally.    PERTINENT LABORATORY AND RADIOLOGIC DATA  Most recent CBC    Latest Ref Rng & Units 04/16/2021   11:01 AM 12/07/2020    3:29 AM 12/06/2020   12:23 PM  CBC  WBC 4.0 - 10.5 K/uL 10.1  10.0  8.8   Hemoglobin 13.0 - 17.0 g/dL 72.0  94.7  09.6   Hematocrit 39.0 - 52.0 % 36.8  32.1  35.6   Platelets 150 - 400 K/uL 229  217  235      Most recent CMP    Latest Ref Rng & Units 04/16/2021   11:01 AM 12/08/2020    2:23 AM 12/07/2020    3:29 AM  CMP  Glucose 70 - 99 mg/dL 283  662  947   BUN 8 - 23 mg/dL 13  22  32   Creatinine 0.61 - 1.24 mg/dL 6.54  6.50  3.54   Sodium 135 - 145 mmol/L 136  139  139   Potassium 3.5 - 5.1 mmol/L 3.8  3.6  4.5   Chloride 98 - 111 mmol/L 100  104  104   CO2 22 - 32 mmol/L 29  25  26    Calcium 8.9 - 10.3 mg/dL 9.0  8.8  9.0   Total Protein 6.5 - 8.1 g/dL 8.7   7.1   Total Bilirubin 0.3 - 1.2 mg/dL 1.2   0.8   Alkaline Phos 38 - 126 U/L 76   83   AST 15 - 41 U/L 43   20   ALT 0 - 44 U/L 42   24     Renal function CrCl cannot be calculated (Patient's most recent lab result is older than the maximum 21 days allowed.).  Hgb A1c MFr Bld (%)  Date Value  12/07/2020 8.7 (H)    LDL Chol Calc (NIH)  Date Value Ref Range Status  02/07/2020 60 0 - 99 mg/dL Final   LDL Direct  Date Value Ref Range Status  10/31/2019 115 (H) 0 - 99 mg/dL Final     Left lower extremity venous reflux study:  - No evidence of deep vein  thrombosis from the common femoral through the  popliteal veins.  - No evidence of superficial venous thrombosis.  - The common femoral vein is not competent.  - The great saphenous vein is not competent (proximal and mid thigh only).  - The small saphenous vein is competent.      12/29/2019. Rande Brunt, MD [facility] Phone Number: [phone] 05/25/2022 5:10 PM  Total time spent on preparing this encounter including chart review, data review, collecting history, examining the patient, coordinating care for this new patient, 30 minutes.  Portions of this report may have been transcribed using voice recognition software.  Every effort has been made to ensure accuracy; however, inadvertent computerized transcription errors may still be present.

## 2022-05-25 ENCOUNTER — Ambulatory Visit: Payer: 59 | Admitting: Vascular Surgery

## 2022-05-25 ENCOUNTER — Ambulatory Visit (HOSPITAL_COMMUNITY)
Admission: RE | Admit: 2022-05-25 | Discharge: 2022-05-25 | Disposition: A | Discharge status: Home / Self Care | Payer: 59 | Source: Ambulatory Visit | Attending: Vascular Surgery | Admitting: Vascular Surgery

## 2022-05-25 ENCOUNTER — Encounter: Payer: Self-pay | Admitting: Vascular Surgery

## 2022-05-25 VITALS — BP 125/78 | HR 63 | Temp 98.4°F | Resp 20 | Ht 71.0 in | Wt 239.0 lb

## 2022-05-25 DIAGNOSIS — Z86718 Personal history of other venous thrombosis and embolism: Secondary | ICD-10-CM

## 2022-05-25 DIAGNOSIS — U099 Post covid-19 condition, unspecified: Secondary | ICD-10-CM | POA: Diagnosis not present

## 2022-05-25 DIAGNOSIS — M25562 Pain in left knee: Secondary | ICD-10-CM | POA: Insufficient documentation

## 2022-05-25 DIAGNOSIS — G9332 Myalgic encephalomyelitis/chronic fatigue syndrome: Secondary | ICD-10-CM | POA: Diagnosis not present

## 2022-05-25 DIAGNOSIS — M25561 Pain in right knee: Secondary | ICD-10-CM | POA: Diagnosis not present

## 2022-06-06 ENCOUNTER — Emergency Department (HOSPITAL_BASED_OUTPATIENT_CLINIC_OR_DEPARTMENT_OTHER): Payer: No Typology Code available for payment source

## 2022-06-06 ENCOUNTER — Encounter (HOSPITAL_BASED_OUTPATIENT_CLINIC_OR_DEPARTMENT_OTHER): Payer: Self-pay

## 2022-06-06 ENCOUNTER — Emergency Department (HOSPITAL_BASED_OUTPATIENT_CLINIC_OR_DEPARTMENT_OTHER)
Admission: EM | Admit: 2022-06-06 | Discharge: 2022-06-06 | Disposition: A | Discharge status: Home / Self Care | Payer: No Typology Code available for payment source | Attending: Emergency Medicine | Admitting: Emergency Medicine

## 2022-06-06 ENCOUNTER — Other Ambulatory Visit: Payer: Self-pay

## 2022-06-06 DIAGNOSIS — Z7902 Long term (current) use of antithrombotics/antiplatelets: Secondary | ICD-10-CM | POA: Diagnosis not present

## 2022-06-06 DIAGNOSIS — Z794 Long term (current) use of insulin: Secondary | ICD-10-CM | POA: Insufficient documentation

## 2022-06-06 DIAGNOSIS — M79604 Pain in right leg: Secondary | ICD-10-CM | POA: Insufficient documentation

## 2022-06-06 DIAGNOSIS — M79605 Pain in left leg: Secondary | ICD-10-CM | POA: Diagnosis not present

## 2022-06-06 DIAGNOSIS — Z7982 Long term (current) use of aspirin: Secondary | ICD-10-CM | POA: Insufficient documentation

## 2022-06-06 MED ORDER — LIDOCAINE 5 % EX PTCH: 1.0000 | MEDICATED_PATCH | CUTANEOUS | 0 refills | Status: AC

## 2022-06-06 NOTE — Discharge Instructions (Addendum)
Eric Cohen, Thank you for allowing me to take part in your care today.  Here is what we discussed today.  1.  Regarding your leg pain, this is not a acute clot.  We did an ultrasound, which did not show any clot in your veins.  This is likely related to muscle pains or muscle stiffness.  We will prescribe you lidocaine patches, to help for this.  This will hold you over until you are able to get into your primary care physician and your vascular surgeon.  2.  Please follow-up with PCP for evaluation for leg pain outpatient.  3.  Please continue to follow-up with vascular surgery.  4. If you develop any shortness of breath, chest pain, or pain out of proportion, please return back to the emergency department for reevaluation.  Thank you, Dr. Allena Katz

## 2022-06-06 NOTE — ED Triage Notes (Signed)
He c/o bilat. Calf pain which is nagging and preventing him from sleeping soundly. He has known multiple DVT's and is currently on anticoagulant for this condition. CMS intact bilat feet, ankles and all toes.

## 2022-06-06 NOTE — ED Notes (Signed)
RT note: Pt. roomed to # 14 with v/s obtained, Wife at bedside.

## 2022-06-06 NOTE — ED Provider Notes (Addendum)
[facility] EMERGENCY DEPT Provider Note   CSN: [id number] Arrival date & time: 06/06/22  0800     History  Chief Complaint  Patient presents with   Leg Pain    Eric Cohen is a 67 y.o. male with a history of recurrent DVTs presenting to the emergency department with concerns of calf tenderness and soreness.  Patient reports that 4 days ago he was doing physical therapy in the pool, and afterwards developed calf pain.  He states that it has been constant since 4 days ago, and reports that he thinks he may have a blood clot.  He describes this Pain as a tightness.  He reports that he takes edoxaban 60 mg daily for recurrent DVTs.  He has been worked up for this, and states that it is because of long-haul COVID that he is hypercoagulable.  He reports that he feels this pain is the same as when he had clots in the past.  Patient denies any long plane rides or car rides.  He reports compliance with his medication.  Patient did have a recent ultrasound on 05/25/2022 that did not show any evidence of clots.  Patient is scheduled to see vascular surgery on Tuesday.  Patient denies any chest pain, shortness of breath, nausea, vomiting, or diarrhea.  He denies any fever, or chills.   Leg Pain Associated symptoms: no fever        Home Medications Prior to Admission medications   Medication Sig Start Date End Date Taking? Authorizing Provider  lidocaine (LIDODERM) 5 % Place 1 patch onto the skin daily. Remove & Discard patch within 12 hours or as directed by MD 06/06/22  Yes Modena Slater, DO  albuterol (PROVENTIL HFA;VENTOLIN HFA) 108 (90 BASE) MCG/ACT inhaler Inhale 2 puffs into the lungs every 6 (six) hours as needed for wheezing.    [provider]  albuterol (PROVENTIL) (2.5 MG/3ML) 0.083% nebulizer solution Take 3 mLs (2.5 mg total) by nebulization every 6 (six) hours as needed for wheezing or shortness of breath. 04/16/21   Koleen Distance, MD  amLODipine (NORVASC) 10  MG tablet Take 1 tablet (10 mg total) by mouth daily. 12/09/20   Regalado, Belkys A, MD  aspirin 81 MG EC tablet TAKE 1 TABLET BY MOUTH EVERY DAY Patient taking differently: Take 81 mg by mouth every morning. 09/06/19   Toniann Fail, NP  atorvastatin (LIPITOR) 80 MG tablet TAKE 1 TABLET (80 MG TOTAL) BY MOUTH DAILY AT 6 PM. 02/20/20   Yates Decamp, MD  azelastine (ASTELIN) 0.1 % nasal spray Place 1 spray into both nostrils 2 (two) times daily as needed (congestion). Use in each nostril as directed    [provider]  budesonide-formoterol (SYMBICORT) 160-4.5 MCG/ACT inhaler Inhale 2 puffs into the lungs 2 (two) times daily as needed (shortness of breath/wheezing).    [provider]  carbamazepine (TEGRETOL) 200 MG tablet Take 200 mg by mouth at bedtime.    [provider]  cetirizine (ZYRTEC) 10 MG tablet Take 10 mg by mouth at bedtime.    [provider]  clopidogrel (PLAVIX) 75 MG tablet TAKE 1 TABLET BY MOUTH EVERY DAY Patient taking differently: Take 75 mg by mouth every morning. 09/01/20   Tolia, Sunit, DO  doxepin (SINEQUAN) 50 MG capsule Take 50 mg by mouth daily. 04/30/22   [provider]  doxepin (SINEQUAN) 75 MG capsule Take 75 mg by mouth at bedtime as needed (nightmares).    [provider]  doxycycline (VIBRAMYCIN) 100 MG capsule Take 1 capsule (100 mg total) by mouth 2 (two) times daily. 04/16/21   Koleen Distance, MD  escitalopram (LEXAPRO) 10 MG tablet Take 10 mg by mouth every morning.    [provider]  ezetimibe (ZETIA) 10 MG tablet TAKE 1 TABLET BY MOUTH EVERY DAY Patient taking differently: Take 10 mg by mouth daily. 10/31/20   Tolia, Sunit, DO  fluticasone (FLONASE) 50 MCG/ACT nasal spray Place 1 spray into both nostrils daily as needed (congestion).    [provider]  gabapentin (NEURONTIN) 300 MG capsule Take 600 mg by mouth at bedtime.    [provider]  HYDROcodone bit-homatropine (HYCODAN)  5-1.5 MG/5ML syrup Take 5 mLs by mouth every 6 (six) hours as needed for cough. Patient not taking: Reported on 05/25/2022 04/16/21   Koleen Distance, MD  insulin glargine (LANTUS) 100 UNIT/ML Solostar Pen Inject 68 Units into the skin at bedtime.    [provider]  metoprolol tartrate (LOPRESSOR) 50 MG tablet Take 1 tablet (50 mg total) by mouth 2 (two) times daily. 12/08/20   Regalado, Belkys A, MD  montelukast (SINGULAIR) 10 MG tablet Take 10 mg by mouth every morning.    [provider]  nitroGLYCERIN (NITROSTAT) 0.4 MG SL tablet Place 1 tablet (0.4 mg total) under the tongue every 5 (five) minutes as needed for chest pain. 10/06/20 04/12/22  Tolia, Sunit, DO  polyethylene glycol (MIRALAX / GLYCOLAX) 17 g packet Take 17 g by mouth daily as needed for mild constipation. 12/08/20   Regalado, Belkys A, MD  prazosin (MINIPRESS) 5 MG capsule Take 5 mg by mouth at bedtime as needed (nightmares).    [provider]  Rivaroxaban Stater Pack, 15 mg and 20 mg, (XARELTO STARTER PACK) See admin instructions. Patient not taking: Reported on 05/25/2022 03/04/22   [provider]      Allergies    Shellfish allergy and Lisinopril    Review of Systems   Review of Systems  Constitutional:  Negative for chills and fever.  Respiratory:  Negative for shortness of breath and wheezing.   Cardiovascular:  Negative for chest pain.  Gastrointestinal:  Negative for abdominal pain, diarrhea, nausea and vomiting.  Musculoskeletal:        Patient endorses bilateral calf pain.    Physical Exam Updated Vital Signs BP (!) 145/75   Pulse 72   Temp 98.4 F (36.9 C) (Oral)   Resp 18   SpO2 95%  Physical Exam General: Alert and orientated x3. Patient is resting comfortably in bed in no acute distress  Eyes:EOM intact  Head: Normocephalic, atraumatic  Neck: Supple, nontender, full range of motion, No JVD Cardio: Regular rate and rhythm, no murmurs, rubs or gallops. 2+ pulses to  bilateral upper and lower extremities  Chest: No chest tenderness Pulmonary: Clear to ausculation bilaterally with no rales, rhonchi, and crackles  Skin: No rashes noted  MSK: 5/5 strength to upper and lower extremities.  No edema appreciated to bilateral lower extremities.  2+ pedal pulses.  No erythema, or streaking noted to bilateral lower extremities.  No popliteal tenderness to bilateral lower extremities.  Negative Homans' sign to bilateral lower extremities.   ED Results / Procedures / Treatments   Labs (all labs ordered are listed, but only abnormal results are displayed) Labs Reviewed - No data to display  EKG None  Radiology US Venous Img Lower Bilateral  Result Date: 06/06/2022 CLINICAL DATA:  Bilateral leg pain. On anticoagulation  therapy for previous DVT. EXAM: BILATERAL LOWER EXTREMITY VENOUS DOPPLER ULTRASOUND TECHNIQUE: Gray-scale sonography with compression, as well as color and duplex ultrasound, were performed to evaluate the deep venous system(s) from the level of the common femoral vein through the popliteal and proximal calf veins. COMPARISON:  None Available. FINDINGS: VENOUS Normal compressibility of the common femoral, superficial femoral, and popliteal veins, as well as the visualized calf veins. Visualized portions of profunda femoral vein and great saphenous vein unremarkable. No filling defects to suggest DVT on grayscale or color Doppler imaging. Doppler waveforms show normal direction of venous flow, normal respiratory plasticity and response to augmentation. Limited views of the contralateral common femoral vein are unremarkable. OTHER None. Limitations: none IMPRESSION: Negative.  No evidence of DVT in either lower extremity. Electronically Signed   By: Danae Orleans M.D.   On: 06/06/2022 10:09    Procedures Procedures    Medications Ordered in ED Medications - No data to display  ED Course/ Medical Decision Making/ A&P                           Medical  Decision Making This is a 66 year old male with a past medical history of recurrent DVTs presenting to the emergency department with concerns of bilateral calf pain.  Patient is on daily edoxaban 60 mg.  Patient reports compliance with this medication.  Patient does have a history of physical exertion a couple days ago, and could be related.  On exam, patient does have calf tenderness bilaterally, no popliteal tenderness.  Bilateral pulses are strong.  Negative Homans' sign.  Given exam and history, would like to rule out clot at this time.  We will order bilateral lower extremity ultrasounds to look for DVT.  This also could be musculoskeletal in nature given his physical exertion a few days ago.  Patient does report historically that lidocaine patches help with his pain.  Patient recently had an ultrasound of his lower extremities, on 08/01, which did not show any clots at that time.  No evidence of PE at this time.  Will obtain ultrasound of lower extremities at this time.  Amount and/or Complexity of Data Reviewed External Data Reviewed: notes. Radiology: ordered.  Risk Prescription drug management.   1016: Reevaluated patient at this time.  Patient is doing good on reevaluation.  Bilateral lower extremity ultrasound showing no evidence of DVT at this time.  This leg pain is likely related to musculoskeletal issues.  Patient will be prescribed lidocaine patches on discharge.  Patient to follow-up with vascular surgery and PCP.  Patient is stable for discharge at this time and will be discharged.         Final Clinical Impression(s) / ED Diagnoses Final diagnoses:  Leg pain, bilateral    Rx / DC Orders ED Discharge Orders          Ordered    lidocaine (LIDODERM) 5 %  Every 24 hours        06/06/22 1024              Modena Slater, DO 06/06/22 1027    Modena Slater, DO 06/06/22 1429    Tegeler, Canary Brim, MD 06/07/22 2056

## 2022-06-15 ENCOUNTER — Ambulatory Visit: Payer: 59

## 2022-06-15 ENCOUNTER — Other Ambulatory Visit: Payer: Self-pay

## 2022-06-15 DIAGNOSIS — I25118 Atherosclerotic heart disease of native coronary artery with other forms of angina pectoris: Secondary | ICD-10-CM

## 2022-06-15 DIAGNOSIS — I2510 Atherosclerotic heart disease of native coronary artery without angina pectoris: Secondary | ICD-10-CM

## 2022-06-15 MED ORDER — NITROGLYCERIN 0.4 MG SL SUBL: 0.4000 mg | SUBLINGUAL_TABLET | SUBLINGUAL | 3 refills | Status: DC | PRN

## 2022-06-21 NOTE — Progress Notes (Signed)
LVM for patient to return call to discuss Echo results

## 2022-06-22 NOTE — Progress Notes (Signed)
Called patient, NA, LMAM

## 2022-06-23 NOTE — Progress Notes (Signed)
3rd attempt : Called patient, Na, LMAM

## 2022-07-13 ENCOUNTER — Encounter: Payer: Self-pay | Admitting: Cardiology

## 2022-07-13 ENCOUNTER — Ambulatory Visit: Payer: 59 | Admitting: Cardiology

## 2022-07-13 VITALS — BP 126/65 | HR 75 | Temp 98.1°F | Resp 16 | Ht 71.0 in | Wt 241.0 lb

## 2022-07-13 DIAGNOSIS — E782 Mixed hyperlipidemia: Secondary | ICD-10-CM

## 2022-07-13 DIAGNOSIS — Z86718 Personal history of other venous thrombosis and embolism: Secondary | ICD-10-CM

## 2022-07-13 DIAGNOSIS — I10 Essential (primary) hypertension: Secondary | ICD-10-CM

## 2022-07-13 DIAGNOSIS — Z794 Long term (current) use of insulin: Secondary | ICD-10-CM

## 2022-07-13 DIAGNOSIS — E119 Type 2 diabetes mellitus without complications: Secondary | ICD-10-CM

## 2022-07-13 DIAGNOSIS — I25118 Atherosclerotic heart disease of native coronary artery with other forms of angina pectoris: Secondary | ICD-10-CM

## 2022-07-13 DIAGNOSIS — E6609 Other obesity due to excess calories: Secondary | ICD-10-CM

## 2022-07-13 MED ORDER — PANTOPRAZOLE SODIUM 40 MG PO TBEC: 40.0000 mg | DELAYED_RELEASE_TABLET | Freq: Every day | ORAL | 0 refills | Status: DC

## 2022-07-13 MED ORDER — NITROGLYCERIN 0.4 MG SL SUBL: 0.4000 mg | SUBLINGUAL_TABLET | SUBLINGUAL | 3 refills | Status: AC | PRN

## 2022-07-13 NOTE — Progress Notes (Signed)
Eric Cohen Date of Birth: 02-Jun-1956 MRN: [id number] Primary Care Provider:Reese, Jocelyn Lamer, MD Former Cardiology Providers: Dr. Yates Decamp Primary Cardiologist: Tessa Lerner, DO (established care 02/08/2020)   Date: 07/13/22 Last Office Visit: 04/12/2022  Chief Complaint  Patient presents with   Coronary Artery Disease   Follow-up   Chest Pain   HPI  Eric Cohen is a 66 y.o.  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  Patient has known history of coronary artery disease which was identified on the angiogram in November 2020.  He is noted to have 1 single-vessel CAD involving the second obtuse marginal branch; however, the vessel caliber is small and therefore did not undergo intervention.  His antianginal therapy has been uptitrated and has done well. Since last office visit he did undergo an echocardiogram which notes preserved LVEF, grade 1 diastolic impairment, and mild valvular heart disease.  He is requesting a refill on sublingual nitroglycerin tablets.  When asked why he has been using 70 pills patient states that he usually has chest pain when he wakes up early in the morning.  The discomfort is on the left anterior chest, tightness like sensation, duration of less than 60 minutes, relieved by sublingual nitroglycerin tablets, not brought on by effort related activities.  He usually starts with a breathing treatment and if no significant improvement he does take sublingual nitroglycerin tablets and that usually resolves his pain/discomfort.  He requires 2-4 nitroglycerin tablets for symptom relief.  He tries to ambulate 11,000 steps per day and has not noticed any significant change in physical endurance.  He has dinner at approximately 7 PM and goes to bed around 9 PM.  He does have a long history of  insulin-dependent diabetes but no formal diagnosis of gastroparesis.  And no prior diagnosis of heartburn/GERD.  With regards to claudication patient establish care with vascular surgery and will defer additional work-up/evaluation to them going forward.  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  His anticoagulation is currently being managed by hematology (per patient).  ALLERGIES: Allergies  Allergen Reactions   Shellfish Allergy Anaphylaxis    "close my throat"   Lisinopril Cough   MEDICATION LIST PRIOR TO VISIT: Current Outpatient Medications on File Prior to Visit  Medication Sig Dispense Refill   albuterol (PROVENTIL HFA;VENTOLIN HFA) 108 (90 BASE) MCG/ACT inhaler Inhale 2 puffs into the lungs every 6 (six) hours as needed for wheezing.     albuterol (PROVENTIL) (2.5 MG/3ML) 0.083% nebulizer solution Take 3 mLs (2.5 mg total) by nebulization every 6 (six) hours as needed for wheezing or shortness of breath. 75 mL 2   amLODipine (NORVASC) 10 MG tablet Take 1 tablet (10 mg total) by mouth daily. 30 tablet 0   atorvastatin (LIPITOR) 80 MG tablet TAKE 1 TABLET (80 MG TOTAL) BY MOUTH DAILY AT 6 PM. 90 tablet 2   azelastine (ASTELIN) 0.1 % nasal spray Place 1 spray into both nostrils 2 (two) times daily as needed (congestion). Use in each nostril as directed     budesonide-formoterol (SYMBICORT) 160-4.5 MCG/ACT inhaler Inhale 2 puffs into the lungs 2 (two) times daily as needed (shortness of breath/wheezing).     carbamazepine (TEGRETOL) 200 MG tablet Take 200 mg by mouth at bedtime.     cetirizine (ZYRTEC) 10 MG tablet Take 10 mg  by mouth at bedtime.     clopidogrel (PLAVIX) 75 MG tablet TAKE 1 TABLET BY MOUTH EVERY DAY (Patient taking differently: Take 75 mg by mouth every morning.) 90 tablet 1   doxepin (SINEQUAN) 50 MG capsule Take 50 mg by mouth daily.     doxepin (SINEQUAN) 75 MG capsule Take 75 mg by mouth at bedtime as needed  (nightmares).     doxycycline (VIBRAMYCIN) 100 MG capsule Take 1 capsule (100 mg total) by mouth 2 (two) times daily. 20 capsule 0   escitalopram (LEXAPRO) 10 MG tablet Take 10 mg by mouth every morning.     ezetimibe (ZETIA) 10 MG tablet TAKE 1 TABLET BY MOUTH EVERY DAY (Patient taking differently: Take 10 mg by mouth daily.) 90 tablet 0   fluticasone (FLONASE) 50 MCG/ACT nasal spray Place 1 spray into both nostrils daily as needed (congestion).     gabapentin (NEURONTIN) 300 MG capsule Take 600 mg by mouth at bedtime.     insulin glargine (LANTUS) 100 UNIT/ML Solostar Pen Inject 68 Units into the skin at bedtime.     lidocaine (LIDODERM) 5 % Place 1 patch onto the skin daily. Remove & Discard patch within 12 hours or as directed by MD 30 patch 0   metoprolol tartrate (LOPRESSOR) 50 MG tablet Take 1 tablet (50 mg total) by mouth 2 (two) times daily. 30 tablet 0   montelukast (SINGULAIR) 10 MG tablet Take 10 mg by mouth every morning.     polyethylene glycol (MIRALAX / GLYCOLAX) 17 g packet Take 17 g by mouth daily as needed for mild constipation. 14 each 0   prazosin (MINIPRESS) 5 MG capsule Take 5 mg by mouth at bedtime as needed (nightmares).     Rivaroxaban (XARELTO) 15 MG TABS tablet Take 15 mg by mouth daily with supper.     No current facility-administered medications on file prior to visit.    PAST MEDICAL HISTORY: Past Medical History:  Diagnosis Date   Asthma    Coronary artery disease    Depression    Diabetes mellitus without complication (HCC)    Diverticulitis    DVT (deep venous thrombosis) (HCC)    Heart disease    Hyperlipidemia    Hypertension     PAST SURGICAL HISTORY: Past Surgical History:  Procedure Laterality Date   APPENDECTOMY     CHOLECYSTECTOMY     COLOSTOMY REVERSAL     KNEE SURGERY     LEFT HEART CATH AND CORONARY ANGIOGRAPHY N/A 08/31/2019   Procedure: LEFT HEART CATH AND CORONARY ANGIOGRAPHY;  Surgeon: Yates DecampGanji, Jay, MD;  Location: [facility];   Service: Cardiovascular;  Laterality: N/A;    FAMILY HISTORY: The patient's family history includes Diabetes in his father; Heart failure in his mother; Hyperlipidemia in his father.   SOCIAL HISTORY:  The patient  reports that he has never smoked. He has never used smokeless tobacco. He reports that he does not drink alcohol and does not use drugs.  Review of Systems  Cardiovascular:  Positive for chest pain and claudication. Negative for dyspnea on exertion, leg swelling, near-syncope, orthopnea, palpitations, paroxysmal nocturnal dyspnea and syncope.  Respiratory:  Negative for shortness of breath.   Neurological:  Positive for paresthesias.    PHYSICAL EXAM:    07/13/2022    8:45 AM 06/06/2022   10:00 AM 06/06/2022    9:30 AM  Vitals with BMI  Height 5\' 11"     Weight 241 lbs    BMI 33.63  Systolic 126 145 115  Diastolic 65 75 87  Pulse 75 72 69   CONSTITUTIONAL: Well-developed and well-nourished. No acute distress.  Ambulates with a cane. SKIN: Skin is warm and dry. No rash noted. No cyanosis. No pallor. No jaundice HEAD: Normocephalic and atraumatic.  EYES: No scleral icterus MOUTH/THROAT: Moist oral membranes.  NECK: No JVD present. No thyromegaly noted. No carotid bruits  CHEST Normal respiratory effort. No intercostal retractions  LUNGS: Clear to auscultation bilaterally.  No stridor. No wheezes. No rales.  CARDIOVASCULAR: Regular rate and rhythm, positive S1-S2, no murmurs rubs or gallops appreciated. ABDOMINAL: No apparent ascites.  EXTREMITIES: No peripheral edema, no discoloration /cyanosis, open wounds.  2+ bilateral dorsalis pedis pulses.  Faint bilateral PT pulses and popliteal pulses.  2+ bilateral femoral pulses. HEMATOLOGIC: No significant bruising NEUROLOGIC: Oriented to person, place, and time. Nonfocal. Normal muscle tone.  PSYCHIATRIC: Normal mood and affect. Normal behavior. Cooperative  CARDIAC DATABASE: EKG: 04/12/2022: NSR, 90 bpm, first-degree  AV block, without underlying injury pattern. 07/13/2022: Normal sinus rhythm, 73 bpm, normal axis, without underlying ischemia or injury pattern.    Echocardiogram 06/15/2022: Normal LV systolic function with visual EF 60-65%. Left ventricle cavity is normal in size. Normal left ventricular wall thickness. Normal global wall motion. Doppler evidence of grade I (impaired) diastolic dysfunction, normal LAP. Structurally normal trileaflet aortic valve. Mild (Grade I) aortic regurgitation. Structurally normal tricuspid valve with trace regurgitation. No evidence of pulmonary hypertension. Structurally normal pulmonic valve. Mild pulmonic regurgitation. No significant change compared to previous  Stress Test: Lexiscan (Walking with Camelia Phenes) Sestamibi Stress Test 02/25/2020: Nondiagnostic ECG stress. Myocardial perfusion is normal. Overall LV systolic function is normal without regional wall motion abnormalities. Stress LV EF: 55%.  No previous exam available for comparison.  Left heart catheterization 08/31/2019: Right dominant circulation, no significant disease. Left main: Normal.  LAD: Mild diffuse disease, mid segment has a 20% stenosis.  Mild calcification is evident.  Circumflex: Mild disease in the proximal and mid segment.  OM 2 which is a small to moderate-sized vessel, has a acute angle with a 90% ostial stenosis. LV: Normal LVEDP, EF 55 to 65% without wall motion abnormality.  LABORATORY DATA:    Latest Ref Rng & Units 04/16/2021   11:01 AM 12/07/2020    3:29 AM 12/06/2020   12:23 PM  CBC  WBC 4.0 - 10.5 K/uL 10.1  10.0  8.8   Hemoglobin 13.0 - 17.0 g/dL 72.6  20.3  55.9   Hematocrit 39.0 - 52.0 % 36.8  32.1  35.6   Platelets 150 - 400 K/uL 229  217  235        Latest Ref Rng & Units 04/16/2021   11:01 AM 12/08/2020    2:23 AM 12/07/2020    3:29 AM  CMP  Glucose 70 - 99 mg/dL 741  638  453   BUN 8 - 23 mg/dL 13  22  32   Creatinine 0.61 - 1.24 mg/dL 6.46  8.03  2.12    Sodium 135 - 145 mmol/L 136  139  139   Potassium 3.5 - 5.1 mmol/L 3.8  3.6  4.5   Chloride 98 - 111 mmol/L 100  104  104   CO2 22 - 32 mmol/L 29  25  26    Calcium 8.9 - 10.3 mg/dL 9.0  8.8  9.0   Total Protein 6.5 - 8.1 g/dL 8.7   7.1   Total Bilirubin 0.3 - 1.2 mg/dL 1.2  0.8   Alkaline Phos 38 - 126 U/L 76   83   AST 15 - 41 U/L 43   20   ALT 0 - 44 U/L 42   24     Lipid Panel  Lab Results  Component Value Date   CHOL 122 02/07/2020   HDL 35 (L) 02/07/2020   LDLCALC 60 02/07/2020   LDLDIRECT 115 (H) 10/31/2019   TRIG 156 (H) 02/07/2020   CHOLHDL 5.0 08/31/2019   Lab Results  Component Value Date   HGBA1C 8.7 (H) 12/07/2020   HGBA1C 8.3 (H) 08/31/2019   No components found for: "NTPROBNP" Lab Results  Component Value Date   TSH 0.691 08/31/2019    External Labs: Collected: Mar 11, 2022 [facility] Care Everywhere Hemoglobin 13 g/dL, hematocrit 16.1%. Hemoglobin A1c 7.3 Creatinine 1.18, BUN 15. Sodium 135, potassium 4.1, chloride 101, bicarb 28  FINAL MEDICATION LIST END OF ENCOUNTER: Meds ordered this encounter  Medications   nitroGLYCERIN (NITROSTAT) 0.4 MG SL tablet    Sig: Place 1 tablet (0.4 mg total) under the tongue every 5 (five) minutes as needed for chest pain.    Dispense:  25 tablet    Refill:  3   pantoprazole (PROTONIX) 40 MG tablet    Sig: Take 1 tablet (40 mg total) by mouth daily.    Dispense:  90 tablet    Refill:  0    Medications Discontinued During This Encounter  Medication Reason   HYDROcodone bit-homatropine (HYCODAN) 5-1.5 MG/5ML syrup    Rivaroxaban Stater Pack, 15 mg and 20 mg, (XARELTO STARTER PACK)    aspirin 81 MG EC tablet    nitroGLYCERIN (NITROSTAT) 0.4 MG SL tablet Reorder      Current Outpatient Medications:    albuterol (PROVENTIL HFA;VENTOLIN HFA) 108 (90 BASE) MCG/ACT inhaler, Inhale 2 puffs into the lungs every 6 (six) hours as needed for wheezing., Disp: , Rfl:    albuterol (PROVENTIL) (2.5 MG/3ML) 0.083%  nebulizer solution, Take 3 mLs (2.5 mg total) by nebulization every 6 (six) hours as needed for wheezing or shortness of breath., Disp: 75 mL, Rfl: 2   amLODipine (NORVASC) 10 MG tablet, Take 1 tablet (10 mg total) by mouth daily., Disp: 30 tablet, Rfl: 0   atorvastatin (LIPITOR) 80 MG tablet, TAKE 1 TABLET (80 MG TOTAL) BY MOUTH DAILY AT 6 PM., Disp: 90 tablet, Rfl: 2   azelastine (ASTELIN) 0.1 % nasal spray, Place 1 spray into both nostrils 2 (two) times daily as needed (congestion). Use in each nostril as directed, Disp: , Rfl:    budesonide-formoterol (SYMBICORT) 160-4.5 MCG/ACT inhaler, Inhale 2 puffs into the lungs 2 (two) times daily as needed (shortness of breath/wheezing)., Disp: , Rfl:    carbamazepine (TEGRETOL) 200 MG tablet, Take 200 mg by mouth at bedtime., Disp: , Rfl:    cetirizine (ZYRTEC) 10 MG tablet, Take 10 mg by mouth at bedtime., Disp: , Rfl:    clopidogrel (PLAVIX) 75 MG tablet, TAKE 1 TABLET BY MOUTH EVERY DAY (Patient taking differently: Take 75 mg by mouth every morning.), Disp: 90 tablet, Rfl: 1   doxepin (SINEQUAN) 50 MG capsule, Take 50 mg by mouth daily., Disp: , Rfl:    doxepin (SINEQUAN) 75 MG capsule, Take 75 mg by mouth at bedtime as needed (nightmares)., Disp: , Rfl:    doxycycline (VIBRAMYCIN) 100 MG capsule, Take 1 capsule (100 mg total) by mouth 2 (two) times daily., Disp: 20 capsule, Rfl: 0   escitalopram (LEXAPRO) 10 MG  tablet, Take 10 mg by mouth every morning., Disp: , Rfl:    ezetimibe (ZETIA) 10 MG tablet, TAKE 1 TABLET BY MOUTH EVERY DAY (Patient taking differently: Take 10 mg by mouth daily.), Disp: 90 tablet, Rfl: 0   fluticasone (FLONASE) 50 MCG/ACT nasal spray, Place 1 spray into both nostrils daily as needed (congestion)., Disp: , Rfl:    gabapentin (NEURONTIN) 300 MG capsule, Take 600 mg by mouth at bedtime., Disp: , Rfl:    insulin glargine (LANTUS) 100 UNIT/ML Solostar Pen, Inject 68 Units into the skin at bedtime., Disp: , Rfl:    lidocaine  (LIDODERM) 5 %, Place 1 patch onto the skin daily. Remove & Discard patch within 12 hours or as directed by MD, Disp: 30 patch, Rfl: 0   metoprolol tartrate (LOPRESSOR) 50 MG tablet, Take 1 tablet (50 mg total) by mouth 2 (two) times daily., Disp: 30 tablet, Rfl: 0   montelukast (SINGULAIR) 10 MG tablet, Take 10 mg by mouth every morning., Disp: , Rfl:    pantoprazole (PROTONIX) 40 MG tablet, Take 1 tablet (40 mg total) by mouth daily., Disp: 90 tablet, Rfl: 0   polyethylene glycol (MIRALAX / GLYCOLAX) 17 g packet, Take 17 g by mouth daily as needed for mild constipation., Disp: 14 each, Rfl: 0   prazosin (MINIPRESS) 5 MG capsule, Take 5 mg by mouth at bedtime as needed (nightmares)., Disp: , Rfl:    Rivaroxaban (XARELTO) 15 MG TABS tablet, Take 15 mg by mouth daily with supper., Disp: , Rfl:    nitroGLYCERIN (NITROSTAT) 0.4 MG SL tablet, Place 1 tablet (0.4 mg total) under the tongue every 5 (five) minutes as needed for chest pain., Disp: 25 tablet, Rfl: 3  IMPRESSION:    ICD-10-CM   1. Atherosclerosis of native coronary artery of native heart with other form of angina pectoris (HCC)  I25.118 nitroGLYCERIN (NITROSTAT) 0.4 MG SL tablet    pantoprazole (PROTONIX) 40 MG tablet    PCV MYOCARDIAL PERFUSION WO LEXISCAN    EKG 12-Lead    2. Essential hypertension  I10     3. Type 2 diabetes mellitus without complication, with long-term current use of insulin (HCC)  E11.9    Z79.4     4. Long-term insulin use (HCC)  Z79.4     5. Mixed hyperlipidemia  E78.2     6. History of DVT (deep vein thrombosis)  Z86.718     7. Class 1 obesity due to excess calories with serious comorbidity and body mass index (BMI) of 32.0 to 32.9 in adult  E66.09    Z68.32        RECOMMENDATIONS: Eric Cohen is a 66 y.o. 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 No active anginal discomfort. EKG: Nonischemic. Refill nitroglycerin tablets. We will prescribe Protonix 40 mg p.o. daily for 4 weeks. Patient is encouraged to please keep 4 hours between his last meal and going to sleep. Exercise nuclear stress test to evaluate for reversible ischemia. No clinical indication for triple therapy.  I have asked him to stop aspirin 81 mg p.o. daily. He is on Plavix as well as  DOAC. Educated on seeking medical attention sooner  by going to the closest ER via EMS if the symptoms increase in intensity, frequency, duration, or has typical chest pain as discussed in the office.  Patient verbalized understanding.  Essential hypertension Well-controlled. No medication changes warranted  Type 2 diabetes mellitus without complication, with long-term current use of insulin (HCC) Currently on insulin therapy. Educated on importance of glycemic control. Allergic to ACE inhibitors.  But would benefit from ARB have asked him to discuss this further with PCP or his endocrinologist given the renal protective benefits in the setting of diabetes.  Mixed hyperlipidemia Continue atorvastatin and Zetia. Does not endorse myalgias. Currently managed by primary care provider.  History of DVT (deep vein thrombosis) Currently on Xarelto. Anticoagulation managed by other providers and his care team.  Class 1 obesity due to excess calories with serious comorbidity and body mass index (BMI) of 32.0 to 32.9 in adult Body mass index is 33.61 kg/m. I reviewed with the patient the importance of diet, regular physical activity/exercise, weight loss.   Patient is educated on increasing physical activity gradually as tolerated.   With the goal of moderate intensity exercise for 30 minutes a day 5 days a week.  Orders Placed This Encounter  Procedures   PCV MYOCARDIAL PERFUSION WO LEXISCAN   EKG 12-Lead   --Continue cardiac medications as reconciled in final medication list. --Return in about 4 weeks (around 08/10/2022) for Reevaluation of, Chest pain, Review test results. Or sooner if needed. --Continue follow-up with your primary care physician regarding the management of your other chronic comorbid conditions.  This note was created using a voice recognition software as a result there may be grammatical errors inadvertently enclosed that do not reflect the nature of this encounter. Every attempt is made to correct such errors.  Rex Kras, [place], [facility]  Pager: [phone] Office: [phone]

## 2022-07-16 ENCOUNTER — Ambulatory Visit: Payer: 59

## 2022-07-16 DIAGNOSIS — I25118 Atherosclerotic heart disease of native coronary artery with other forms of angina pectoris: Secondary | ICD-10-CM

## 2022-07-29 ENCOUNTER — Encounter: Payer: Self-pay | Admitting: Cardiology

## 2022-08-09 NOTE — Progress Notes (Signed)
Patient called back, I have discussed results with him.

## 2022-08-09 NOTE — Progress Notes (Signed)
Called pt no answer, left a vm

## 2022-08-10 ENCOUNTER — Ambulatory Visit: Payer: 59 | Admitting: Cardiology

## 2022-08-31 ENCOUNTER — Ambulatory Visit: Payer: 59 | Admitting: Cardiology

## 2022-08-31 ENCOUNTER — Encounter: Payer: Self-pay | Admitting: Cardiology

## 2022-08-31 VITALS — BP 136/70 | HR 79 | Temp 98.2°F | Resp 16 | Ht 71.0 in | Wt 244.0 lb

## 2022-08-31 DIAGNOSIS — I10 Essential (primary) hypertension: Secondary | ICD-10-CM

## 2022-08-31 DIAGNOSIS — E782 Mixed hyperlipidemia: Secondary | ICD-10-CM

## 2022-08-31 DIAGNOSIS — Z86718 Personal history of other venous thrombosis and embolism: Secondary | ICD-10-CM

## 2022-08-31 DIAGNOSIS — I2510 Atherosclerotic heart disease of native coronary artery without angina pectoris: Secondary | ICD-10-CM

## 2022-08-31 DIAGNOSIS — Z794 Long term (current) use of insulin: Secondary | ICD-10-CM

## 2022-08-31 DIAGNOSIS — E6609 Other obesity due to excess calories: Secondary | ICD-10-CM

## 2022-08-31 NOTE — Progress Notes (Signed)
Eric Cohen Date of Birth: 21-Dec-1955 MRN: [id number] Primary Care Provider:Reese, Jocelyn Lamer, MD Former Cardiology Providers: Dr. Yates Decamp Primary Cardiologist: Tessa Lerner, DO (established care 02/08/2020)   Date: 08/31/22 Last Office Visit: 07/13/2022  Chief Complaint  Patient presents with   Follow-up    Reevaluation of chest pain and discuss test results   HPI  Eric Cohen is a 66 y.o.  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  Noted to have single-vessel CAD back in 2020 involving the second obtuse marginal branch.  But the vessel caliber was small and therefore did not undergo intervention.  His antianginal therapy has been uptitrated in the past and has done well.  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    Given his cardiovascular risk factors including insulin-dependent diabetes, use of sublingual nitroglycerin tablets, and other cardiovascular risk factors that shared decision was to proceed with stress test.  Stress test was reported to be overall low risk study.  He has not required sublingual nitroglycerin tablet since last office visit.  He was started on Protonix also which has helped his symptoms.  He continues to ambulate 11,000 steps per day and has not noticed any significant change in physical endurance.  He does have leg pain bilaterally right side worse than the left.  Patient states that he is followed up with orthopedic surgery and was told that its radicular discomfort.  With regards to claudication patient establish care with vascular surgery and will defer additional work-up/evaluation to them going forward.  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  His  anticoagulation is currently being managed by hematology (per patient).   ALLERGIES: Allergies  Allergen Reactions   Shellfish Allergy Anaphylaxis    "close my throat"   Lisinopril Cough   MEDICATION LIST PRIOR TO VISIT: Current Outpatient Medications on File Prior to Visit  Medication Sig Dispense Refill   albuterol (PROVENTIL HFA;VENTOLIN HFA) 108 (90 BASE) MCG/ACT inhaler Inhale 2 puffs into the lungs every 6 (six) hours as needed for wheezing.     albuterol (PROVENTIL) (2.5 MG/3ML) 0.083% nebulizer solution Take 3 mLs (2.5 mg total) by nebulization every 6 (six) hours as needed for wheezing or shortness of breath. 75 mL 2   amLODipine (NORVASC) 10 MG tablet Take 1 tablet (10 mg total) by mouth daily. 30 tablet 0   atorvastatin (LIPITOR) 80 MG tablet TAKE 1 TABLET (80 MG TOTAL) BY MOUTH DAILY AT 6 PM. 90 tablet 2   azelastine (ASTELIN) 0.1 % nasal spray Place 1 spray into both nostrils 2 (two) times daily as needed (congestion). Use in each nostril as directed     budesonide-formoterol (SYMBICORT) 160-4.5 MCG/ACT inhaler Inhale 2 puffs into the lungs 2 (two) times daily as needed (shortness of breath/wheezing).     carbamazepine (TEGRETOL) 200 MG tablet Take 200 mg by mouth at bedtime.     cetirizine (ZYRTEC) 10 MG tablet Take 10 mg by mouth at bedtime.     clopidogrel (PLAVIX) 75 MG tablet TAKE 1 TABLET BY MOUTH EVERY DAY (Patient taking differently: Take 75 mg by mouth every morning.) 90 tablet 1   doxepin (SINEQUAN) 50 MG capsule Take 50 mg by mouth daily.     doxepin (SINEQUAN) 75 MG capsule Take 75  mg by mouth at bedtime as needed (nightmares).     doxycycline (VIBRAMYCIN) 100 MG capsule Take 1 capsule (100 mg total) by mouth 2 (two) times daily. 20 capsule 0   escitalopram (LEXAPRO) 10 MG tablet Take 10 mg by mouth every morning.     fluticasone (FLONASE) 50 MCG/ACT nasal spray Place 1 spray into both nostrils daily as needed (congestion).     gabapentin (NEURONTIN) 300 MG capsule  Take 600 mg by mouth at bedtime.     insulin glargine (LANTUS) 100 UNIT/ML Solostar Pen Inject 68 Units into the skin at bedtime.     metoprolol tartrate (LOPRESSOR) 50 MG tablet Take 1 tablet (50 mg total) by mouth 2 (two) times daily. 30 tablet 0   montelukast (SINGULAIR) 10 MG tablet Take 10 mg by mouth every morning.     nitroGLYCERIN (NITROSTAT) 0.4 MG SL tablet Place 1 tablet (0.4 mg total) under the tongue every 5 (five) minutes as needed for chest pain. 25 tablet 3   polyethylene glycol (MIRALAX / GLYCOLAX) 17 g packet Take 17 g by mouth daily as needed for mild constipation. 14 each 0   prazosin (MINIPRESS) 5 MG capsule Take 5 mg by mouth at bedtime as needed (nightmares).     Rivaroxaban (XARELTO) 15 MG TABS tablet Take 15 mg by mouth daily with supper.     lidocaine (LIDODERM) 5 % Place 1 patch onto the skin daily. Remove & Discard patch within 12 hours or as directed by MD 30 patch 0   No current facility-administered medications on file prior to visit.    PAST MEDICAL HISTORY: Past Medical History:  Diagnosis Date   Asthma    Coronary artery disease    Depression    Diabetes mellitus without complication (HCC)    Diverticulitis    DVT (deep venous thrombosis) (HCC)    Heart disease    Hyperlipidemia    Hypertension     PAST SURGICAL HISTORY: Past Surgical History:  Procedure Laterality Date   APPENDECTOMY     CHOLECYSTECTOMY     COLOSTOMY REVERSAL     KNEE SURGERY     LEFT HEART CATH AND CORONARY ANGIOGRAPHY N/A 08/31/2019   Procedure: LEFT HEART CATH AND CORONARY ANGIOGRAPHY;  Surgeon: Yates DecampGanji, Jay, MD;  Location: [facility];  Service: Cardiovascular;  Laterality: N/A;    FAMILY HISTORY: The patient's family history includes Diabetes in his father; Heart failure in his mother; Hyperlipidemia in his father.   SOCIAL HISTORY:  The patient  reports that he has never smoked. He has never used smokeless tobacco. He reports that he does not drink alcohol and  does not use drugs.  Review of Systems  Cardiovascular:  Positive for claudication (right thigh - nerve pain per orthopedic). Negative for chest pain, dyspnea on exertion, leg swelling, near-syncope, orthopnea, palpitations, paroxysmal nocturnal dyspnea and syncope.  Respiratory:  Negative for shortness of breath.   Neurological:  Positive for paresthesias.    PHYSICAL EXAM:    08/31/2022    2:41 PM 07/13/2022    8:45 AM 06/06/2022   10:00 AM  Vitals with BMI  Height 5\' 11"  5\' 11"    Weight 244 lbs 241 lbs   BMI 34.05 33.63   Systolic 136 126 [id number]  Diastolic 70 65 75  Pulse 79 75 72   CONSTITUTIONAL: Well-developed and well-nourished. No acute distress.  Ambulates with a cane. SKIN: Skin is warm and dry. No rash noted. No cyanosis. No pallor. No jaundice  HEAD: Normocephalic and atraumatic.  EYES: No scleral icterus MOUTH/THROAT: Moist oral membranes.  NECK: No JVD present. No thyromegaly noted. No carotid bruits  CHEST Normal respiratory effort. No intercostal retractions  LUNGS: Clear to auscultation bilaterally.  No stridor. No wheezes. No rales.  CARDIOVASCULAR: Regular rate and rhythm, positive S1-S2, no murmurs rubs or gallops appreciated. ABDOMINAL: No apparent ascites.  EXTREMITIES: No peripheral edema, no discoloration /cyanosis, open wounds.  2+ bilateral dorsalis pedis pulses.  Faint bilateral PT pulses and popliteal pulses.  2+ bilateral femoral pulses. HEMATOLOGIC: No significant bruising NEUROLOGIC: Oriented to person, place, and time. Nonfocal. Normal muscle tone.  PSYCHIATRIC: Normal mood and affect. Normal behavior. Cooperative  CARDIAC DATABASE: EKG: 04/12/2022: NSR, 90 bpm, first-degree AV block, without underlying injury pattern. 07/13/2022: Normal sinus rhythm, 73 bpm, normal axis, without underlying ischemia or injury pattern.    Echocardiogram 06/15/2022: Normal LV systolic function with visual EF 60-65%. Left ventricle cavity is normal in size. Normal left  ventricular wall thickness. Normal global wall motion. Doppler evidence of grade I (impaired) diastolic dysfunction, normal LAP. Structurally normal trileaflet aortic valve. Mild (Grade I) aortic regurgitation. Structurally normal tricuspid valve with trace regurgitation. No evidence of pulmonary hypertension. Structurally normal pulmonic valve. Mild pulmonic regurgitation. No significant change compared to previous  Stress Test: Exercise sestamibi stress test 07/16/2022: Exercise nuclear stress test was performed using Bruce protocol. Patient reached 7 METS, and 96% of age predicted maximum heart rate. Exercise capacity was low. No chest pain reported. normal heart rate response. Resting hypertension 160/90 mmHg with extremely elevated peak stress BP 180/90 mmHg. Stress EKG revealed no ischemic changes. Normal myocardial perfusion. Stress LVEF 65%. No change in myocardial perfusion imaging compared to previous Lexiscan nuclear stress test on 02/25/2020. Low risk study.   Left heart catheterization 08/31/2019: Right dominant circulation, no significant disease. Left main: Normal.  LAD: Mild diffuse disease, mid segment has a 20% stenosis.  Mild calcification is evident.  Circumflex: Mild disease in the proximal and mid segment.  OM 2 which is a small to moderate-sized vessel, has a acute angle with a 90% ostial stenosis. LV: Normal LVEDP, EF 55 to 65% without wall motion abnormality.  LABORATORY DATA:    Latest Ref Rng & Units 04/16/2021   11:01 AM 12/07/2020    3:29 AM 12/06/2020   12:23 PM  CBC  WBC 4.0 - 10.5 K/uL 10.1  10.0  8.8   Hemoglobin 13.0 - 17.0 g/dL 16.0  10.9  32.3   Hematocrit 39.0 - 52.0 % 36.8  32.1  35.6   Platelets 150 - 400 K/uL 229  217  235        Latest Ref Rng & Units 04/16/2021   11:01 AM 12/08/2020    2:23 AM 12/07/2020    3:29 AM  CMP  Glucose 70 - 99 mg/dL 557  322  025   BUN 8 - 23 mg/dL 13  22  32   Creatinine 0.61 - 1.24 mg/dL 4.27  0.62  3.76   Sodium 135  - 145 mmol/L 136  139  139   Potassium 3.5 - 5.1 mmol/L 3.8  3.6  4.5   Chloride 98 - 111 mmol/L 100  104  104   CO2 22 - 32 mmol/L 29  25  26    Calcium 8.9 - 10.3 mg/dL 9.0  8.8  9.0   Total Protein 6.5 - 8.1 g/dL 8.7   7.1   Total Bilirubin 0.3 - 1.2 mg/dL 1.2  0.8   Alkaline Phos 38 - 126 U/L 76   83   AST 15 - 41 U/L 43   20   ALT 0 - 44 U/L 42   24     Lipid Panel  Lab Results  Component Value Date   CHOL 122 02/07/2020   HDL 35 (L) 02/07/2020   LDLCALC 60 02/07/2020   LDLDIRECT 115 (H) 10/31/2019   TRIG 156 (H) 02/07/2020   CHOLHDL 5.0 08/31/2019   Lab Results  Component Value Date   HGBA1C 8.7 (H) 12/07/2020   HGBA1C 8.3 (H) 08/31/2019   No components found for: "NTPROBNP" Lab Results  Component Value Date   TSH 0.691 08/31/2019    External Labs: Collected: Mar 11, 2022 [facility] Everywhere Hemoglobin 13 g/dL, hematocrit 40.9%. Hemoglobin A1c 7.3 Creatinine 1.18, BUN 15. Sodium 135, potassium 4.1, chloride 101, bicarb 28  FINAL MEDICATION LIST END OF ENCOUNTER: No orders of the defined types were placed in this encounter.   Medications Discontinued During This Encounter  Medication Reason   ezetimibe (ZETIA) 10 MG tablet    pantoprazole (PROTONIX) 40 MG tablet       Current Outpatient Medications:    albuterol (PROVENTIL HFA;VENTOLIN HFA) 108 (90 BASE) MCG/ACT inhaler, Inhale 2 puffs into the lungs every 6 (six) hours as needed for wheezing., Disp: , Rfl:    albuterol (PROVENTIL) (2.5 MG/3ML) 0.083% nebulizer solution, Take 3 mLs (2.5 mg total) by nebulization every 6 (six) hours as needed for wheezing or shortness of breath., Disp: 75 mL, Rfl: 2   amLODipine (NORVASC) 10 MG tablet, Take 1 tablet (10 mg total) by mouth daily., Disp: 30 tablet, Rfl: 0   atorvastatin (LIPITOR) 80 MG tablet, TAKE 1 TABLET (80 MG TOTAL) BY MOUTH DAILY AT 6 PM., Disp: 90 tablet, Rfl: 2   azelastine (ASTELIN) 0.1 % nasal spray, Place 1 spray into both nostrils 2  (two) times daily as needed (congestion). Use in each nostril as directed, Disp: , Rfl:    budesonide-formoterol (SYMBICORT) 160-4.5 MCG/ACT inhaler, Inhale 2 puffs into the lungs 2 (two) times daily as needed (shortness of breath/wheezing)., Disp: , Rfl:    carbamazepine (TEGRETOL) 200 MG tablet, Take 200 mg by mouth at bedtime., Disp: , Rfl:    cetirizine (ZYRTEC) 10 MG tablet, Take 10 mg by mouth at bedtime., Disp: , Rfl:    clopidogrel (PLAVIX) 75 MG tablet, TAKE 1 TABLET BY MOUTH EVERY DAY (Patient taking differently: Take 75 mg by mouth every morning.), Disp: 90 tablet, Rfl: 1   doxepin (SINEQUAN) 50 MG capsule, Take 50 mg by mouth daily., Disp: , Rfl:    doxepin (SINEQUAN) 75 MG capsule, Take 75 mg by mouth at bedtime as needed (nightmares)., Disp: , Rfl:    doxycycline (VIBRAMYCIN) 100 MG capsule, Take 1 capsule (100 mg total) by mouth 2 (two) times daily., Disp: 20 capsule, Rfl: 0   escitalopram (LEXAPRO) 10 MG tablet, Take 10 mg by mouth every morning., Disp: , Rfl:    fluticasone (FLONASE) 50 MCG/ACT nasal spray, Place 1 spray into both nostrils daily as needed (congestion)., Disp: , Rfl:    gabapentin (NEURONTIN) 300 MG capsule, Take 600 mg by mouth at bedtime., Disp: , Rfl:    insulin glargine (LANTUS) 100 UNIT/ML Solostar Pen, Inject 68 Units into the skin at bedtime., Disp: , Rfl:    metoprolol tartrate (LOPRESSOR) 50 MG tablet, Take 1 tablet (50 mg total) by mouth 2 (two) times daily., Disp: 30 tablet,  Rfl: 0   montelukast (SINGULAIR) 10 MG tablet, Take 10 mg by mouth every morning., Disp: , Rfl:    nitroGLYCERIN (NITROSTAT) 0.4 MG SL tablet, Place 1 tablet (0.4 mg total) under the tongue every 5 (five) minutes as needed for chest pain., Disp: 25 tablet, Rfl: 3   polyethylene glycol (MIRALAX / GLYCOLAX) 17 g packet, Take 17 g by mouth daily as needed for mild constipation., Disp: 14 each, Rfl: 0   prazosin (MINIPRESS) 5 MG capsule, Take 5 mg by mouth at bedtime as needed  (nightmares)., Disp: , Rfl:    Rivaroxaban (XARELTO) 15 MG TABS tablet, Take 15 mg by mouth daily with supper., Disp: , Rfl:    lidocaine (LIDODERM) 5 %, Place 1 patch onto the skin daily. Remove & Discard patch within 12 hours or as directed by MD, Disp: 30 patch, Rfl: 0  IMPRESSION:    ICD-10-CM   1. Atherosclerosis of native coronary artery of native heart without angina pectoris  I25.10     2. Essential hypertension  I10     3. Type 2 diabetes mellitus without complication, with long-term current use of insulin (HCC)  E11.9    Z79.4     4. Long-term insulin use (HCC)  Z79.4     5. Mixed hyperlipidemia  E78.2     6. History of DVT (deep vein thrombosis)  Z86.718     7. Class 1 obesity due to excess calories with serious comorbidity and body mass index (BMI) of 34.0 to 34.9 in adult  E66.09    Z68.34        RECOMMENDATIONS: Eric Cohen is a 66 y.o. 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  Atherosclerosis of native coronary artery of native heart without angina pectoris (HCC) No active pain.  EKG: in the recent past nonischemic. MPI: low risk  Symptoms have improved w/ PPI - likely GI in etiology.  No additional testing warranted at this time; however, he is educated on seeking medical attention sooner by going to the closest ER via EMS if the symptoms increase in intensity, frequency, duration, or has typical chest pain as discussed in the office.  Patient verbalized understanding.  Essential hypertension Well-controlled. No medication changes warranted  Type 2 diabetes mellitus without complication, with long-term current use of insulin (HCC) Currently on insulin therapy. Educated on importance of glycemic control. Allergic to ACE inhibitors.  But would benefit from ARB have asked him to  discuss this further with PCP or his endocrinologist given the renal protective benefits in the setting of diabetes.  Mixed hyperlipidemia Continue atorvastatin and Zetia. Does not endorse myalgias. Will have it rechecked at the [facility] hospital.  Currently managed by primary care provider.  History of DVT (deep vein thrombosis) Currently on Xarelto. Anticoagulation managed by other providers and his care team.  Class 1 obesity due to excess calories with serious comorbidity and body mass index (BMI) of 34.0 to 34.9 in adult Body mass index is 34.03 kg/m. I reviewed with the patient the importance of diet, regular physical activity/exercise, weight loss.   Patient is educated on increasing physical activity gradually as tolerated.  With the goal of moderate intensity exercise for 30 minutes a day 5 days a week.  No orders of the defined types were placed in this encounter.  --Continue cardiac medications as reconciled in final medication list. --  Return in about 6 months (around 03/01/2023) for Follow up, CAD, manage risk factors. Or sooner if needed. --Continue follow-up with your primary care physician regarding the management of your other chronic comorbid conditions.  This note was created using a voice recognition software as a result there may be grammatical errors inadvertently enclosed that do not reflect the nature of this encounter. Every attempt is made to correct such errors.  Rex Kras, [place], [facility]  Pager: [phone] Office: [phone]

## 2023-02-10 ENCOUNTER — Encounter: Payer: Self-pay | Admitting: Family Medicine

## 2023-02-10 ENCOUNTER — Other Ambulatory Visit: Payer: Self-pay | Admitting: Family Medicine

## 2023-02-10 DIAGNOSIS — N50812 Left testicular pain: Secondary | ICD-10-CM

## 2023-02-11 ENCOUNTER — Ambulatory Visit
Admission: RE | Admit: 2023-02-11 | Discharge: 2023-02-11 | Disposition: A | Discharge status: Home / Self Care | Payer: Medicare Other | Source: Ambulatory Visit | Attending: Family Medicine | Admitting: Family Medicine

## 2023-02-11 DIAGNOSIS — N50811 Right testicular pain: Secondary | ICD-10-CM

## 2023-03-01 ENCOUNTER — Ambulatory Visit: Payer: 59 | Admitting: Cardiology

## 2023-03-14 ENCOUNTER — Ambulatory Visit: Payer: Medicare Other | Admitting: Cardiology

## 2023-03-14 ENCOUNTER — Encounter: Payer: Self-pay | Admitting: Cardiology

## 2023-03-14 VITALS — BP 122/73 | HR 73 | Ht 71.0 in | Wt 244.0 lb

## 2023-03-14 DIAGNOSIS — E119 Type 2 diabetes mellitus without complications: Secondary | ICD-10-CM

## 2023-03-14 DIAGNOSIS — E6609 Other obesity due to excess calories: Secondary | ICD-10-CM

## 2023-03-14 DIAGNOSIS — I2510 Atherosclerotic heart disease of native coronary artery without angina pectoris: Secondary | ICD-10-CM

## 2023-03-14 DIAGNOSIS — E782 Mixed hyperlipidemia: Secondary | ICD-10-CM

## 2023-03-14 DIAGNOSIS — I10 Essential (primary) hypertension: Secondary | ICD-10-CM

## 2023-03-14 DIAGNOSIS — Z794 Long term (current) use of insulin: Secondary | ICD-10-CM

## 2023-03-14 DIAGNOSIS — E66811 Obesity, class 1: Secondary | ICD-10-CM

## 2023-03-14 DIAGNOSIS — Z86718 Personal history of other venous thrombosis and embolism: Secondary | ICD-10-CM

## 2023-03-14 MED ORDER — NEXLIZET 180-10 MG PO TABS: 1.0000 | ORAL_TABLET | Freq: Every day | ORAL | 0 refills | Status: AC

## 2023-03-14 NOTE — Patient Instructions (Signed)
Pick up the prescription for Nexlizet at [facility].  Around April 28, 2023 get fasting lipids done at [facility].  See you in the office in 3 months.

## 2023-03-14 NOTE — Progress Notes (Signed)
Eric Cohen Date of Birth: Jan 13, 1956 MRN: [id number] Primary Care Provider:Reese, Jocelyn Lamer, MD Former Cardiology Providers: Dr. Yates Decamp Primary Cardiologist: Tessa Lerner, DO (established care 02/08/2020)   Date: 03/14/23 Last Office Visit: 08/31/2022  Chief Complaint  Patient presents with   Coronary Artery Disease   Follow-up   HPI  Eric Cohen is a 67 y.o.  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  In 2020 patient was noted to have one-vessel CAD involving the second obtuse marginal branch.  The vessel was small in caliber and therefore did not undergo intervention.  His antianginal therapy has been uptitrated and has done well.  He also had a recent stress test as noted on prior office notes which was noted to be low risk.  He presents today for 68-month follow-up visit.  No recurrence of chest pain.  No heart failure symptoms.  He used to ambulate at 11,000 steps per day but now down to 5000 steps per day secondary to right hip pain.  He has seen providers in [place] and has an appointment with [facility] later this week.  Patient follows up with vascular and vein and  given his history of recurrent lower extremity DVTs and is currently on medical therapy.  His anticoagulation is currently being managed by hematology (per patient).  ALLERGIES: Allergies  Allergen Reactions   Shellfish Allergy Anaphylaxis    "close my throat"   Lisinopril Cough   MEDICATION LIST PRIOR TO VISIT: Current Outpatient Medications on File Prior to Visit  Medication Sig Dispense Refill   albuterol (PROVENTIL HFA;VENTOLIN HFA) 108 (90 BASE) MCG/ACT inhaler Inhale 2 puffs into the lungs every 6 (six) hours as needed for wheezing.     albuterol (PROVENTIL) (2.5 MG/3ML) 0.083% nebulizer solution Take 3 mLs (2.5 mg total)  by nebulization every 6 (six) hours as needed for wheezing or shortness of breath. 75 mL 2   aspirin EC 81 MG tablet Take 81 mg by mouth daily. Swallow whole.     atorvastatin (LIPITOR) 80 MG tablet TAKE 1 TABLET (80 MG TOTAL) BY MOUTH DAILY AT 6 PM. 90 tablet 2   azelastine (ASTELIN) 0.1 % nasal spray Place 1 spray into both nostrils 2 (two) times daily as needed (congestion). Use in each nostril as directed     budesonide-formoterol (SYMBICORT) 160-4.5 MCG/ACT inhaler Inhale 2 puffs into the lungs 2 (two) times daily as needed (shortness of breath/wheezing).     carbamazepine (TEGRETOL) 200 MG tablet Take 200 mg by mouth at bedtime.     cetirizine (ZYRTEC) 10 MG tablet Take 10 mg by mouth at bedtime.     doxepin (SINEQUAN) 50 MG capsule Take 50 mg by mouth daily.     doxepin (SINEQUAN) 75 MG capsule Take 75 mg by mouth at bedtime as needed (nightmares).     edoxaban (SAVAYSA) 60 MG TABS tablet Take 60 mg by mouth daily.     empagliflozin (JARDIANCE) 25 MG TABS tablet Take 25 mg by mouth daily.     escitalopram (LEXAPRO) 10 MG tablet Take 10 mg by mouth every morning.     fluticasone (FLONASE) 50 MCG/ACT nasal spray Place 1 spray into both nostrils daily as needed (congestion).     gabapentin (NEURONTIN) 300 MG capsule Take 600 mg by mouth at bedtime.     insulin glargine (LANTUS) 100 UNIT/ML Solostar Pen Inject  68 Units into the skin at bedtime.     lidocaine (LIDODERM) 5 % Place 1 patch onto the skin daily. Remove & Discard patch within 12 hours or as directed by MD 30 patch 0   metoprolol tartrate (LOPRESSOR) 50 MG tablet Take 1 tablet (50 mg total) by mouth 2 (two) times daily. 30 tablet 0   montelukast (SINGULAIR) 10 MG tablet Take 10 mg by mouth every morning.     nitroGLYCERIN (NITROSTAT) 0.4 MG SL tablet Place 1 tablet (0.4 mg total) under the tongue every 5 (five) minutes as needed for chest pain. 25 tablet 3   polyethylene glycol (MIRALAX / GLYCOLAX) 17 g packet Take 17 g by mouth daily  as needed for mild constipation. 14 each 0   prazosin (MINIPRESS) 5 MG capsule Take 5 mg by mouth at bedtime as needed (nightmares).     amLODipine (NORVASC) 10 MG tablet Take 1 tablet (10 mg total) by mouth daily. (Patient not taking: Reported on 03/14/2023) 30 tablet 0   clopidogrel (PLAVIX) 75 MG tablet TAKE 1 TABLET BY MOUTH EVERY DAY (Patient not taking: Reported on 03/14/2023) 90 tablet 1   No current facility-administered medications on file prior to visit.    PAST MEDICAL HISTORY: Past Medical History:  Diagnosis Date   Asthma    Coronary artery disease    Depression    Diabetes mellitus without complication (HCC)    Diverticulitis    DVT (deep venous thrombosis) (HCC)    Heart disease    Hyperlipidemia    Hypertension     PAST SURGICAL HISTORY: Past Surgical History:  Procedure Laterality Date   APPENDECTOMY     CHOLECYSTECTOMY     COLOSTOMY REVERSAL     KNEE SURGERY     LEFT HEART CATH AND CORONARY ANGIOGRAPHY N/A 08/31/2019   Procedure: LEFT HEART CATH AND CORONARY ANGIOGRAPHY;  Surgeon: Yates Decamp, MD;  Location: [facility];  Service: Cardiovascular;  Laterality: N/A;    FAMILY HISTORY: The patient's family history includes Diabetes in his father; Heart failure in his mother; Hyperlipidemia in his father.   SOCIAL HISTORY:  The patient  reports that he has never smoked. He has never used smokeless tobacco. He reports that he does not drink alcohol and does not use drugs.  Review of Systems  Cardiovascular:  Negative for chest pain, claudication, dyspnea on exertion, irregular heartbeat, leg swelling, near-syncope, orthopnea, palpitations, paroxysmal nocturnal dyspnea and syncope.  Respiratory:  Negative for shortness of breath.   Hematologic/Lymphatic: Negative for bleeding problem.  Musculoskeletal:  Positive for arthritis and joint pain. Negative for muscle cramps and myalgias.  Neurological:  Positive for paresthesias (For the right hip into the  groin.). Negative for dizziness and light-headedness.    PHYSICAL EXAM:    03/14/2023   11:24 AM 08/31/2022    2:41 PM 07/13/2022    8:45 AM  Vitals with BMI  Height 5\' 11"  5\' 11"  5\' 11"   Weight 244 lbs 244 lbs 241 lbs  BMI 34.05 34.05 33.63  Systolic 122 136 161  Diastolic 73 70 65  Pulse 73 79 75   Physical Exam  Constitutional: No distress.  Age appropriate, hemodynamically stable.   Neck: No JVD present.  Cardiovascular: Normal rate, regular rhythm, S1 normal, S2 normal and intact distal pulses. Exam reveals no gallop, no S3 and no S4.  No murmur heard. Pulses:      Femoral pulses are 2+ on the right side and 2+ on the left  side.      Dorsalis pedis pulses are 2+ on the right side and 2+ on the left side.       Posterior tibial pulses are 1+ on the right side and 1+ on the left side.  Pulmonary/Chest: Effort normal and breath sounds normal. No stridor. He has no wheezes. He has no rales.  Abdominal: Soft. Bowel sounds are normal. He exhibits no distension. There is no abdominal tenderness.  Musculoskeletal:        General: No edema.     Cervical back: Neck supple.  Neurological: He is alert and oriented to person, place, and time. He has intact cranial nerves (2-12).  Skin: Skin is warm and moist.   CARDIAC DATABASE: EKG: 04/12/2022: NSR, 90 bpm, first-degree AV block, without underlying injury pattern. 07/13/2022: Normal sinus rhythm, 73 bpm, normal axis, without underlying ischemia or injury pattern.  03/14/2023: Sinus Rhythm, 68bpm, nonspecific T-abnormality, without underlying injury pattern.  No changes when compared to 07/13/2022.    Echocardiogram 06/15/2022: Normal LV systolic function with visual EF 60-65%. Left ventricle cavity is normal in size. Normal left ventricular wall thickness. Normal global wall motion. Doppler evidence of grade I (impaired) diastolic dysfunction, normal LAP. Structurally normal trileaflet aortic valve. Mild (Grade I) aortic  regurgitation. Structurally normal tricuspid valve with trace regurgitation. No evidence of pulmonary hypertension. Structurally normal pulmonic valve. Mild pulmonic regurgitation. No significant change compared to previous  Stress Test: Exercise sestamibi stress test 07/16/2022: Exercise nuclear stress test was performed using Bruce protocol. Patient reached 7 METS, and 96% of age predicted maximum heart rate. Exercise capacity was low. No chest pain reported. normal heart rate response. Resting hypertension 160/90 mmHg with extremely elevated peak stress BP 180/90 mmHg. Stress EKG revealed no ischemic changes. Normal myocardial perfusion. Stress LVEF 65%. No change in myocardial perfusion imaging compared to previous Lexiscan nuclear stress test on 02/25/2020. Low risk study.   Left heart catheterization 08/31/2019: Right dominant circulation, no significant disease. Left main: Normal.  LAD: Mild diffuse disease, mid segment has a 20% stenosis.  Mild calcification is evident.  Circumflex: Mild disease in the proximal and mid segment.  OM 2 which is a small to moderate-sized vessel, has a acute angle with a 90% ostial stenosis. LV: Normal LVEDP, EF 55 to 65% without wall motion abnormality.  LABORATORY DATA:    Latest Ref Rng & Units 04/16/2021   11:01 AM 12/07/2020    3:29 AM 12/06/2020   12:23 PM  CBC  WBC 4.0 - 10.5 K/uL 10.1  10.0  8.8   Hemoglobin 13.0 - 17.0 g/dL 40.9  81.1  91.4   Hematocrit 39.0 - 52.0 % 36.8  32.1  35.6   Platelets 150 - 400 K/uL 229  217  235        Latest Ref Rng & Units 04/16/2021   11:01 AM 12/08/2020    2:23 AM 12/07/2020    3:29 AM  CMP  Glucose 70 - 99 mg/dL 782  956  213   BUN 8 - 23 mg/dL 13  22  32   Creatinine 0.61 - 1.24 mg/dL 0.86  5.78  4.69   Sodium 135 - 145 mmol/L 136  139  139   Potassium 3.5 - 5.1 mmol/L 3.8  3.6  4.5   Chloride 98 - 111 mmol/L 100  104  104   CO2 22 - 32 mmol/L 29  25  26    Calcium 8.9 - 10.3 mg/dL 9.0  8.8  9.0  Total  Protein 6.5 - 8.1 g/dL 8.7   7.1   Total Bilirubin 0.3 - 1.2 mg/dL 1.2   0.8   Alkaline Phos 38 - 126 U/L 76   83   AST 15 - 41 U/L 43   20   ALT 0 - 44 U/L 42   24     Lipid Panel  Lab Results  Component Value Date   CHOL 122 02/07/2020   HDL 35 (L) 02/07/2020   LDLCALC 60 02/07/2020   LDLDIRECT 115 (H) 10/31/2019   TRIG 156 (H) 02/07/2020   CHOLHDL 5.0 08/31/2019   Lab Results  Component Value Date   HGBA1C 8.7 (H) 12/07/2020   HGBA1C 8.3 (H) 08/31/2019   No components found for: "NTPROBNP" Lab Results  Component Value Date   TSH 0.691 08/31/2019    External Labs: Collected: Mar 11, 2022 [facility] Care Everywhere Hemoglobin 13 g/dL, hematocrit 09.8%. Hemoglobin A1c 7.3 Creatinine 1.18, BUN 15. Sodium 135, potassium 4.1, chloride 101, bicarb 28  External Labs: Collected: September 01, 2022 [facility] Care Everywhere A1c 6.0 Total cholesterol 166, triglycerides 159 HDL 45, calculated LDL 89 Creatinine 1.14, BUN 13 Sodium 139, potassium 4.7, chloride 106 bicarb 30.    FINAL MEDICATION LIST END OF ENCOUNTER: Meds ordered this encounter  Medications   Bempedoic Acid-Ezetimibe (NEXLIZET) 180-10 MG TABS    Sig: Take 1 tablet by mouth daily at 2 PM.    Dispense:  90 tablet    Refill:  0    Medications Discontinued During This Encounter  Medication Reason   Rivaroxaban (XARELTO) 15 MG TABS tablet Dose change   doxycycline (VIBRAMYCIN) 100 MG capsule Completed Course      Current Outpatient Medications:    albuterol (PROVENTIL HFA;VENTOLIN HFA) 108 (90 BASE) MCG/ACT inhaler, Inhale 2 puffs into the lungs every 6 (six) hours as needed for wheezing., Disp: , Rfl:    albuterol (PROVENTIL) (2.5 MG/3ML) 0.083% nebulizer solution, Take 3 mLs (2.5 mg total) by nebulization every 6 (six) hours as needed for wheezing or shortness of breath., Disp: 75 mL, Rfl: 2   aspirin EC 81 MG tablet, Take 81 mg by mouth daily. Swallow whole., Disp: , Rfl:    atorvastatin  (LIPITOR) 80 MG tablet, TAKE 1 TABLET (80 MG TOTAL) BY MOUTH DAILY AT 6 PM., Disp: 90 tablet, Rfl: 2   azelastine (ASTELIN) 0.1 % nasal spray, Place 1 spray into both nostrils 2 (two) times daily as needed (congestion). Use in each nostril as directed, Disp: , Rfl:    Bempedoic Acid-Ezetimibe (NEXLIZET) 180-10 MG TABS, Take 1 tablet by mouth daily at 2 PM., Disp: 90 tablet, Rfl: 0   budesonide-formoterol (SYMBICORT) 160-4.5 MCG/ACT inhaler, Inhale 2 puffs into the lungs 2 (two) times daily as needed (shortness of breath/wheezing)., Disp: , Rfl:    carbamazepine (TEGRETOL) 200 MG tablet, Take 200 mg by mouth at bedtime., Disp: , Rfl:    cetirizine (ZYRTEC) 10 MG tablet, Take 10 mg by mouth at bedtime., Disp: , Rfl:    doxepin (SINEQUAN) 50 MG capsule, Take 50 mg by mouth daily., Disp: , Rfl:    doxepin (SINEQUAN) 75 MG capsule, Take 75 mg by mouth at bedtime as needed (nightmares)., Disp: , Rfl:    edoxaban (SAVAYSA) 60 MG TABS tablet, Take 60 mg by mouth daily., Disp: , Rfl:    empagliflozin (JARDIANCE) 25 MG TABS tablet, Take 25 mg by mouth daily., Disp: , Rfl:    escitalopram (LEXAPRO) 10 MG tablet,  Take 10 mg by mouth every morning., Disp: , Rfl:    fluticasone (FLONASE) 50 MCG/ACT nasal spray, Place 1 spray into both nostrils daily as needed (congestion)., Disp: , Rfl:    gabapentin (NEURONTIN) 300 MG capsule, Take 600 mg by mouth at bedtime., Disp: , Rfl:    insulin glargine (LANTUS) 100 UNIT/ML Solostar Pen, Inject 68 Units into the skin at bedtime., Disp: , Rfl:    lidocaine (LIDODERM) 5 %, Place 1 patch onto the skin daily. Remove & Discard patch within 12 hours or as directed by MD, Disp: 30 patch, Rfl: 0   metoprolol tartrate (LOPRESSOR) 50 MG tablet, Take 1 tablet (50 mg total) by mouth 2 (two) times daily., Disp: 30 tablet, Rfl: 0   montelukast (SINGULAIR) 10 MG tablet, Take 10 mg by mouth every morning., Disp: , Rfl:    nitroGLYCERIN (NITROSTAT) 0.4 MG SL tablet, Place 1 tablet (0.4 mg  total) under the tongue every 5 (five) minutes as needed for chest pain., Disp: 25 tablet, Rfl: 3   polyethylene glycol (MIRALAX / GLYCOLAX) 17 g packet, Take 17 g by mouth daily as needed for mild constipation., Disp: 14 each, Rfl: 0   prazosin (MINIPRESS) 5 MG capsule, Take 5 mg by mouth at bedtime as needed (nightmares)., Disp: , Rfl:    amLODipine (NORVASC) 10 MG tablet, Take 1 tablet (10 mg total) by mouth daily. (Patient not taking: Reported on 03/14/2023), Disp: 30 tablet, Rfl: 0   clopidogrel (PLAVIX) 75 MG tablet, TAKE 1 TABLET BY MOUTH EVERY DAY (Patient not taking: Reported on 03/14/2023), Disp: 90 tablet, Rfl: 1  IMPRESSION:    ICD-10-CM   1. Atherosclerosis of native coronary artery of native heart without angina pectoris  I25.10 EKG 12-Lead    Bempedoic Acid-Ezetimibe (NEXLIZET) 180-10 MG TABS    Lipid Panel With LDL/HDL Ratio    LDL cholesterol, direct    CMP14+EGFR    2. Essential hypertension  I10     3. Type 2 diabetes mellitus without complication, with long-term current use of insulin (HCC)  E11.9 Bempedoic Acid-Ezetimibe (NEXLIZET) 180-10 MG TABS   Z79.4 Lipid Panel With LDL/HDL Ratio    LDL cholesterol, direct    CMP14+EGFR    4. Long-term insulin use (HCC)  Z79.4     5. Mixed hyperlipidemia  E78.2     6. History of DVT (deep vein thrombosis)  Z86.718     7. Class 1 obesity due to excess calories with serious comorbidity and body mass index (BMI) of 34.0 to 34.9 in adult  E66.09    Z68.34        RECOMMENDATIONS: UTHMAN PORCHER is a 67 y.o. 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  Atherosclerosis of native coronary artery of native heart without angina pectoris [facility]) Denies anginal chest pain. EKG: Nonischemic. Had 2 very short episodes of precordial discomfort 6 months ago  which were relieved by sublingual nitroglycerin tablets. No use of sublingual nitroglycerin tablets for the past 5 months. Most recent MPI: Low risk  Educated him on importance of improving his modifiable cardiovascular risk factors.  Essential hypertension Well-controlled. No medication changes warranted However, I have advised him to speak to PCP or his endocrinologist for consideration of low-dose ARB for renal protection.  Type 2 diabetes mellitus without complication, with long-term current use of insulin (HCC) Currently on insulin therapy. Educated  on importance of glycemic control. Allergic to ACE inhibitors.  But would benefit from ARB have asked him to discuss this further with PCP or his endocrinologist given the renal protective benefits in the setting of diabetes. Recommend a goal LDL <70 mg/dL. Currently on maximally tolerated dose of statin. Will start Nexlizet and labs in 6 weeks.  Mixed hyperlipidemia Continue atorvastatin. Will add Nexlizet Labs in 6 weeks to reevaluate therapy Outside labs independently reviewed via Care Everywhere and noted above for further reference   History of DVT (deep vein thrombosis) Currently on Xarelto. Anticoagulation managed by other providers and his care team.  Class 1 obesity due to excess calories with serious comorbidity and body mass index (BMI) of 34.0 to 34.9 in adult Body mass index is 34.03 kg/m. I reviewed with the patient the importance of diet, regular physical activity/exercise, weight loss.   Patient is educated on increasing physical activity gradually as tolerated.  With the goal of moderate intensity exercise for 30 minutes a day 5 days a week.  Orders Placed This Encounter  Procedures   Lipid Panel With LDL/HDL Ratio   LDL cholesterol, direct   [id number]+[id number]   EKG 12-Lead    --Continue cardiac medications as reconciled in final medication list. --Return in about 3 months (around 06/14/2023) for Follow up, CAD,  Lipid. Or sooner if needed. --Continue follow-up with your primary care physician regarding the management of your other chronic comorbid conditions.  This note was created using a voice recognition software as a result there may be grammatical errors inadvertently enclosed that do not reflect the nature of this encounter. Every attempt is made to correct such errors.  Tessa Lerner, [place], [facility]  Pager:  [phone] Office: [phone]

## 2023-04-20 ENCOUNTER — Other Ambulatory Visit: Payer: Self-pay | Admitting: Urology

## 2023-04-20 DIAGNOSIS — R93811 Abnormal radiologic findings on diagnostic imaging of right testicle: Secondary | ICD-10-CM

## 2023-05-09 ENCOUNTER — Ambulatory Visit
Admission: RE | Admit: 2023-05-09 | Discharge: 2023-05-09 | Disposition: A | Discharge status: Home / Self Care | Payer: Medicare Other | Source: Ambulatory Visit | Attending: Urology | Admitting: Urology

## 2023-05-09 DIAGNOSIS — R93811 Abnormal radiologic findings on diagnostic imaging of right testicle: Secondary | ICD-10-CM

## 2023-06-14 ENCOUNTER — Ambulatory Visit: Payer: 59 | Admitting: Cardiology

## 2023-06-28 ENCOUNTER — Encounter: Payer: Self-pay | Admitting: Cardiology

## 2023-06-28 ENCOUNTER — Ambulatory Visit: Payer: Medicare Other | Admitting: Cardiology

## 2023-06-28 VITALS — BP 125/66 | HR 69 | Resp 16 | Ht 71.0 in | Wt 243.6 lb

## 2023-06-28 DIAGNOSIS — Z794 Long term (current) use of insulin: Secondary | ICD-10-CM

## 2023-06-28 DIAGNOSIS — E66811 Other obesity due to excess calories: Secondary | ICD-10-CM

## 2023-06-28 DIAGNOSIS — I2510 Atherosclerotic heart disease of native coronary artery without angina pectoris: Secondary | ICD-10-CM

## 2023-06-28 DIAGNOSIS — I10 Essential (primary) hypertension: Secondary | ICD-10-CM

## 2023-06-28 DIAGNOSIS — E119 Type 2 diabetes mellitus without complications: Secondary | ICD-10-CM

## 2023-06-28 DIAGNOSIS — E6609 Other obesity due to excess calories: Secondary | ICD-10-CM

## 2023-06-28 DIAGNOSIS — Z86718 Personal history of other venous thrombosis and embolism: Secondary | ICD-10-CM

## 2023-06-28 DIAGNOSIS — E782 Mixed hyperlipidemia: Secondary | ICD-10-CM

## 2023-06-28 MED ORDER — NEXLIZET 180-10 MG PO TABS: 1.0000 | ORAL_TABLET | Freq: Every day | ORAL | 0 refills | Status: AC

## 2023-06-28 NOTE — Progress Notes (Signed)
Eric Cohen Date of Birth: Apr 15, 1956 MRN: [id number] Primary Care Provider:Reese, Jocelyn Lamer, MD Former Cardiology Providers: Dr. Yates Decamp Primary Cardiologist: Tessa Lerner, DO (established care 02/08/2020)   Date: 06/28/23 Last Office Visit: 03/14/2023  Chief Complaint  Patient presents with   Atherosclerosis of native coronary artery of native heart w   HPI  Eric Cohen is a 67 y.o.  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  In 2020 patient was noted to have one-vessel CAD involving the second obtuse marginal branch.  The vessel was small in caliber and therefore did not undergo intervention.  His antianginal therapy has been uptitrated and has done well.  He also had a recent stress test as noted on prior office notes which was noted to be low risk.  At the last office visit he was started on Nexlizet to optimize his lipids as he is a diabetic and has known CAD.  However it appears that he did not start the medication and no recent labs available for review.  Clinically denies anginal chest pain or heart failure symptoms.  Overall functional capacity remains stable but limited like in the past due to hip pain and radicular discomfort.   Patient follows up with vascular and vein and  given his history of recurrent lower extremity DVTs and is currently on medical therapy.  His anticoagulation is currently being managed by hematology (per patient).  ALLERGIES: Allergies  Allergen Reactions   Shellfish Allergy Anaphylaxis    "close my throat"   Lisinopril Cough   MEDICATION LIST PRIOR TO VISIT: Current Outpatient Medications on File Prior to Visit  Medication Sig Dispense Refill   albuterol (PROVENTIL HFA;VENTOLIN HFA) 108 (90 BASE) MCG/ACT inhaler Inhale 2 puffs into the lungs every 6 (six) hours  as needed for wheezing.     albuterol (PROVENTIL) (2.5 MG/3ML) 0.083% nebulizer solution Take 3 mLs (2.5 mg total) by nebulization every 6 (six) hours as needed for wheezing or shortness of breath. 75 mL 2   aspirin EC 81 MG tablet Take 81 mg by mouth daily. Swallow whole.     atorvastatin (LIPITOR) 80 MG tablet TAKE 1 TABLET (80 MG TOTAL) BY MOUTH DAILY AT 6 PM. 90 tablet 2   azelastine (ASTELIN) 0.1 % nasal spray Place 1 spray into both nostrils 2 (two) times daily as needed (congestion). Use in each nostril as directed     budesonide-formoterol (SYMBICORT) 160-4.5 MCG/ACT inhaler Inhale 2 puffs into the lungs 2 (two) times daily as needed (shortness of breath/wheezing).     carbamazepine (TEGRETOL) 200 MG tablet Take 200 mg by mouth at bedtime.     cetirizine (ZYRTEC) 10 MG tablet Take 10 mg by mouth at bedtime.     doxepin (SINEQUAN) 50 MG capsule Take 50 mg by mouth daily.     doxepin (SINEQUAN) 75 MG capsule Take 75 mg by mouth at bedtime as needed (nightmares).     edoxaban (SAVAYSA) 60 MG TABS tablet Take 60 mg by mouth daily.     empagliflozin (JARDIANCE) 25 MG TABS tablet Take 25 mg by mouth daily.     escitalopram (LEXAPRO) 10 MG tablet Take 10 mg by mouth every morning.     fluticasone (FLONASE) 50 MCG/ACT nasal spray Place 1 spray into both nostrils daily as needed (congestion).     gabapentin (NEURONTIN) 300 MG capsule Take 600  mg by mouth at bedtime.     insulin glargine (LANTUS) 100 UNIT/ML Solostar Pen Inject 68 Units into the skin at bedtime.     lidocaine (LIDODERM) 5 % Place 1 patch onto the skin daily. Remove & Discard patch within 12 hours or as directed by MD 30 patch 0   metoprolol tartrate (LOPRESSOR) 50 MG tablet Take 1 tablet (50 mg total) by mouth 2 (two) times daily. (Patient taking differently: Take 25 mg by mouth 2 (two) times daily.) 30 tablet 0   montelukast (SINGULAIR) 10 MG tablet Take 10 mg by mouth every morning.     nitroGLYCERIN (NITROSTAT) 0.4 MG SL tablet  Place 1 tablet (0.4 mg total) under the tongue every 5 (five) minutes as needed for chest pain. 25 tablet 3   polyethylene glycol (MIRALAX / GLYCOLAX) 17 g packet Take 17 g by mouth daily as needed for mild constipation. 14 each 0   prazosin (MINIPRESS) 5 MG capsule Take 5 mg by mouth at bedtime as needed (nightmares).     No current facility-administered medications on file prior to visit.    PAST MEDICAL HISTORY: Past Medical History:  Diagnosis Date   Asthma    Coronary artery disease    Depression    Diabetes mellitus without complication (HCC)    Diverticulitis    DVT (deep venous thrombosis) (HCC)    Heart disease    Hyperlipidemia    Hypertension     PAST SURGICAL HISTORY: Past Surgical History:  Procedure Laterality Date   APPENDECTOMY     CHOLECYSTECTOMY     COLOSTOMY REVERSAL     KNEE SURGERY     LEFT HEART CATH AND CORONARY ANGIOGRAPHY N/A 08/31/2019   Procedure: LEFT HEART CATH AND CORONARY ANGIOGRAPHY;  Surgeon: Yates Decamp, MD;  Location: [facility];  Service: Cardiovascular;  Laterality: N/A;    FAMILY HISTORY: The patient's family history includes Diabetes in his father; Heart failure in his mother; Hyperlipidemia in his father.   SOCIAL HISTORY:  The patient  reports that he has never smoked. He has never used smokeless tobacco. He reports that he does not drink alcohol and does not use drugs.  Review of Systems  Cardiovascular:  Negative for chest pain, claudication, dyspnea on exertion, irregular heartbeat, leg swelling, near-syncope, orthopnea, palpitations, paroxysmal nocturnal dyspnea and syncope.  Respiratory:  Negative for shortness of breath.   Hematologic/Lymphatic: Negative for bleeding problem.  Musculoskeletal:  Positive for arthritis and joint pain. Negative for muscle cramps and myalgias.  Neurological:  Positive for paresthesias (For the right hip into the groin.). Negative for dizziness and light-headedness.    PHYSICAL EXAM:     06/28/2023    9:58 AM 06/28/2023    9:20 AM 03/14/2023   11:24 AM  Vitals with BMI  Height  5\' 11"  5\' 11"   Weight  243 lbs 10 oz 244 lbs  BMI  33.99 34.05  Systolic 125 144 409  Diastolic 66 80 73  Pulse 69 75 73   Physical Exam  Constitutional: No distress.  Age appropriate, hemodynamically stable.   Neck: No JVD present.  Cardiovascular: Normal rate, regular rhythm, S1 normal, S2 normal and intact distal pulses. Exam reveals no gallop, no S3 and no S4.  No murmur heard. Pulses:      Femoral pulses are 2+ on the right side and 2+ on the left side.      Dorsalis pedis pulses are 2+ on the right side and 2+ on the  left side.       Posterior tibial pulses are 1+ on the right side and 1+ on the left side.  Pulmonary/Chest: Effort normal and breath sounds normal. No stridor. He has no wheezes. He has no rales.  Abdominal: Soft. Bowel sounds are normal. He exhibits no distension. There is no abdominal tenderness.  Musculoskeletal:        General: No edema.     Cervical back: Neck supple.  Neurological: He is alert and oriented to person, place, and time. He has intact cranial nerves (2-12).  Skin: Skin is warm and moist.   CARDIAC DATABASE: EKG: 04/12/2022: NSR, 90 bpm, first-degree AV block, without underlying injury pattern. 07/13/2022: Normal sinus rhythm, 73 bpm, normal axis, without underlying ischemia or injury pattern.  03/14/2023: Sinus Rhythm, 68bpm, nonspecific T-abnormality, without underlying injury pattern.  No changes when compared to 07/13/2022.    Echocardiogram 06/15/2022: Normal LV systolic function with visual EF 60-65%. Left ventricle cavity is normal in size. Normal left ventricular wall thickness. Normal global wall motion. Doppler evidence of grade I (impaired) diastolic dysfunction, normal LAP. Structurally normal trileaflet aortic valve. Mild (Grade I) aortic regurgitation. Structurally normal tricuspid valve with trace regurgitation. No evidence of pulmonary  hypertension. Structurally normal pulmonic valve. Mild pulmonic regurgitation. No significant change compared to previous  Stress Test: Exercise sestamibi stress test 07/16/2022: Exercise nuclear stress test was performed using Bruce protocol. Patient reached 7 METS, and 96% of age predicted maximum heart rate. Exercise capacity was low. No chest pain reported. normal heart rate response. Resting hypertension 160/90 mmHg with extremely elevated peak stress BP 180/90 mmHg. Stress EKG revealed no ischemic changes. Normal myocardial perfusion. Stress LVEF 65%. No change in myocardial perfusion imaging compared to previous Lexiscan nuclear stress test on 02/25/2020. Low risk study.   Left heart catheterization 08/31/2019: Right dominant circulation, no significant disease. Left main: Normal.  LAD: Mild diffuse disease, mid segment has a 20% stenosis.  Mild calcification is evident.  Circumflex: Mild disease in the proximal and mid segment.  OM 2 which is a small to moderate-sized vessel, has a acute angle with a 90% ostial stenosis. LV: Normal LVEDP, EF 55 to 65% without wall motion abnormality.  LABORATORY DATA:    Latest Ref Rng & Units 04/16/2021   11:01 AM 12/07/2020    3:29 AM 12/06/2020   12:23 PM  CBC  WBC 4.0 - 10.5 K/uL 10.1  10.0  8.8   Hemoglobin 13.0 - 17.0 g/dL 69.6  29.5  28.4   Hematocrit 39.0 - 52.0 % 36.8  32.1  35.6   Platelets 150 - 400 K/uL 229  217  235        Latest Ref Rng & Units 04/16/2021   11:01 AM 12/08/2020    2:23 AM 12/07/2020    3:29 AM  CMP  Glucose 70 - 99 mg/dL 132  440  102   BUN 8 - 23 mg/dL 13  22  32   Creatinine 0.61 - 1.24 mg/dL 7.25  3.66  4.40   Sodium 135 - 145 mmol/L 136  139  139   Potassium 3.5 - 5.1 mmol/L 3.8  3.6  4.5   Chloride 98 - 111 mmol/L 100  104  104   CO2 22 - 32 mmol/L 29  25  26    Calcium 8.9 - 10.3 mg/dL 9.0  8.8  9.0   Total Protein 6.5 - 8.1 g/dL 8.7   7.1   Total Bilirubin 0.3 - 1.2  mg/dL 1.2   0.8   Alkaline Phos 38 -  126 U/L 76   83   AST 15 - 41 U/L 43   20   ALT 0 - 44 U/L 42   24     Lipid Panel  Lab Results  Component Value Date   CHOL 122 02/07/2020   HDL 35 (L) 02/07/2020   LDLCALC 60 02/07/2020   LDLDIRECT 115 (H) 10/31/2019   TRIG 156 (H) 02/07/2020   CHOLHDL 5.0 08/31/2019   Lab Results  Component Value Date   HGBA1C 8.7 (H) 12/07/2020   HGBA1C 8.3 (H) 08/31/2019   No components found for: "NTPROBNP" Lab Results  Component Value Date   TSH 0.691 08/31/2019    External Labs: Collected: Mar 11, 2022 [facility] Everywhere Hemoglobin 13 g/dL, hematocrit 16.1%. Hemoglobin A1c 7.3 Creatinine 1.18, BUN 15. Sodium 135, potassium 4.1, chloride 101, bicarb 28  External Labs: Collected: September 01, 2022 [facility] Everywhere A1c 6.0 Total cholesterol 166, triglycerides 159 HDL 45, calculated LDL 89 Creatinine 1.14, BUN 13 Sodium 139, potassium 4.7, chloride 106 bicarb 30.    FINAL MEDICATION LIST END OF ENCOUNTER: Meds ordered this encounter  Medications   Bempedoic Acid-Ezetimibe (NEXLIZET) 180-10 MG TABS    Sig: Take 1 tablet by mouth daily for 90 doses.    Dispense:  90 tablet    Refill:  0    Medications Discontinued During This Encounter  Medication Reason   clopidogrel (PLAVIX) 75 MG tablet    amLODipine (NORVASC) 10 MG tablet        Current Outpatient Medications:    albuterol (PROVENTIL HFA;VENTOLIN HFA) 108 (90 BASE) MCG/ACT inhaler, Inhale 2 puffs into the lungs every 6 (six) hours as needed for wheezing., Disp: , Rfl:    albuterol (PROVENTIL) (2.5 MG/3ML) 0.083% nebulizer solution, Take 3 mLs (2.5 mg total) by nebulization every 6 (six) hours as needed for wheezing or shortness of breath., Disp: 75 mL, Rfl: 2   aspirin EC 81 MG tablet, Take 81 mg by mouth daily. Swallow whole., Disp: , Rfl:    atorvastatin (LIPITOR) 80 MG tablet, TAKE 1 TABLET (80 MG TOTAL) BY MOUTH DAILY AT 6 PM., Disp: 90 tablet, Rfl: 2   azelastine (ASTELIN) 0.1 % nasal  spray, Place 1 spray into both nostrils 2 (two) times daily as needed (congestion). Use in each nostril as directed, Disp: , Rfl:    Bempedoic Acid-Ezetimibe (NEXLIZET) 180-10 MG TABS, Take 1 tablet by mouth daily for 90 doses., Disp: 90 tablet, Rfl: 0   budesonide-formoterol (SYMBICORT) 160-4.5 MCG/ACT inhaler, Inhale 2 puffs into the lungs 2 (two) times daily as needed (shortness of breath/wheezing)., Disp: , Rfl:    carbamazepine (TEGRETOL) 200 MG tablet, Take 200 mg by mouth at bedtime., Disp: , Rfl:    cetirizine (ZYRTEC) 10 MG tablet, Take 10 mg by mouth at bedtime., Disp: , Rfl:    doxepin (SINEQUAN) 50 MG capsule, Take 50 mg by mouth daily., Disp: , Rfl:    doxepin (SINEQUAN) 75 MG capsule, Take 75 mg by mouth at bedtime as needed (nightmares)., Disp: , Rfl:    edoxaban (SAVAYSA) 60 MG TABS tablet, Take 60 mg by mouth daily., Disp: , Rfl:    empagliflozin (JARDIANCE) 25 MG TABS tablet, Take 25 mg by mouth daily., Disp: , Rfl:    escitalopram (LEXAPRO) 10 MG tablet, Take 10 mg by mouth every morning., Disp: , Rfl:    fluticasone (FLONASE) 50 MCG/ACT nasal spray,  Place 1 spray into both nostrils daily as needed (congestion)., Disp: , Rfl:    gabapentin (NEURONTIN) 300 MG capsule, Take 600 mg by mouth at bedtime., Disp: , Rfl:    insulin glargine (LANTUS) 100 UNIT/ML Solostar Pen, Inject 68 Units into the skin at bedtime., Disp: , Rfl:    lidocaine (LIDODERM) 5 %, Place 1 patch onto the skin daily. Remove & Discard patch within 12 hours or as directed by MD, Disp: 30 patch, Rfl: 0   metoprolol tartrate (LOPRESSOR) 50 MG tablet, Take 1 tablet (50 mg total) by mouth 2 (two) times daily. (Patient taking differently: Take 25 mg by mouth 2 (two) times daily.), Disp: 30 tablet, Rfl: 0   montelukast (SINGULAIR) 10 MG tablet, Take 10 mg by mouth every morning., Disp: , Rfl:    nitroGLYCERIN (NITROSTAT) 0.4 MG SL tablet, Place 1 tablet (0.4 mg total) under the tongue every 5 (five) minutes as needed for  chest pain., Disp: 25 tablet, Rfl: 3   polyethylene glycol (MIRALAX / GLYCOLAX) 17 g packet, Take 17 g by mouth daily as needed for mild constipation., Disp: 14 each, Rfl: 0   prazosin (MINIPRESS) 5 MG capsule, Take 5 mg by mouth at bedtime as needed (nightmares)., Disp: , Rfl:   IMPRESSION:    ICD-10-CM   1. Atherosclerosis of native coronary artery of native heart without angina pectoris  I25.10 CMP14+EGFR    LDL cholesterol, direct    Lipid Panel With LDL/HDL Ratio    Bempedoic Acid-Ezetimibe (NEXLIZET) 180-10 MG TABS    2. Essential hypertension  I10     3. Type 2 diabetes mellitus without complication, with long-term current use of insulin (HCC)  E11.9 CMP14+EGFR   Z79.4 LDL cholesterol, direct    Lipid Panel With LDL/HDL Ratio    Bempedoic Acid-Ezetimibe (NEXLIZET) 180-10 MG TABS    4. Long-term insulin use (HCC)  Z79.4 Bempedoic Acid-Ezetimibe (NEXLIZET) 180-10 MG TABS    5. Mixed hyperlipidemia  E78.2 Bempedoic Acid-Ezetimibe (NEXLIZET) 180-10 MG TABS    6. History of DVT (deep vein thrombosis)  Z86.718     7. Class 1 obesity due to excess calories with serious comorbidity and body mass index (BMI) of 33.0 to 33.9 in adult  E66.09    Z68.33         RECOMMENDATIONS: DAESHAWN EICHHOLZ is a 67 y.o. 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  Atherosclerosis of native coronary artery of native heart without angina pectoris [facility]) Denies anginal discomfort. Last ischemic workup in September 2023: Low risk study. Echo August 2023: Preserved LVEF, grade 1 diastolic dysfunction, see report for additional details. Medications reconciled. Reemphasized the importance of secondary prevention with focus on improving her modifiable cardiovascular risk factors such as glycemic control, lipid management, blood  pressure control, weight loss.  Essential hypertension Repeat blood check readings are better controlled. Medications reconciled. Given his diabetes and CAD he would still benefit from ARB and also for renal protection.  [facility].  I have asked him to discuss this further  Type 2 diabetes mellitus without complication, with long-term current use of insulin (HCC) Currently on insulin therapy. Educated on importance of glycemic control. Allergic to ACE inhibitors.  But would benefit from ARB have asked him to discuss this further with his provider at the [facility] or his endocrinologist given the renal  protective benefits in the setting of diabetes. Recommend a goal LDL <70 mg/dL. Currently on maximally tolerated dose of statin. Did not start Nexlizet as discussed at the last visit. Will start Nexlizet and fasting lipids in 6 weeks.  Mixed hyperlipidemia Continue atorvastatin. Re emphasize importance of LDL at least <70 mg/dL and if possible <82 mg/dL.  History of DVT (deep vein thrombosis) Currently on edoxaban. Anticoagulation managed by other providers and his care team.  Class 1 obesity due to excess calories with serious comorbidity and body mass index (BMI) of 33.0 to 33.9 in adult Body mass index is 33.98 kg/m. I reviewed with the patient the importance of diet, regular physical activity/exercise, weight loss.   Patient is educated on increasing physical activity gradually as tolerated.  With the goal of moderate intensity exercise for 30 minutes a day 5 days a week.  No orders of the defined types were placed in this encounter.   --Continue cardiac medications as reconciled in final medication list. --Return in about 6 months (around 12/26/2023) for Follow up, CAD, Lipid. Or sooner if needed. --Continue follow-up with your primary care physician regarding the management of your other chronic comorbid conditions.  This note was created using a  voice recognition software as a result there may be grammatical errors inadvertently enclosed that do not reflect the nature of this encounter. Every attempt is made to correct such errors.  Tessa Lerner, [place], [facility]  Pager:  [phone] Office: [phone]

## 2023-08-21 ENCOUNTER — Emergency Department (HOSPITAL_BASED_OUTPATIENT_CLINIC_OR_DEPARTMENT_OTHER)
Admission: EM | Admit: 2023-08-21 | Discharge: 2023-08-21 | Disposition: A | Discharge status: Home / Self Care | Payer: No Typology Code available for payment source | Attending: Emergency Medicine | Admitting: Emergency Medicine

## 2023-08-21 ENCOUNTER — Encounter (HOSPITAL_BASED_OUTPATIENT_CLINIC_OR_DEPARTMENT_OTHER): Payer: Self-pay

## 2023-08-21 ENCOUNTER — Emergency Department (HOSPITAL_BASED_OUTPATIENT_CLINIC_OR_DEPARTMENT_OTHER): Payer: No Typology Code available for payment source

## 2023-08-21 DIAGNOSIS — R0789 Other chest pain: Secondary | ICD-10-CM | POA: Insufficient documentation

## 2023-08-21 DIAGNOSIS — Z7982 Long term (current) use of aspirin: Secondary | ICD-10-CM | POA: Diagnosis not present

## 2023-08-21 DIAGNOSIS — R079 Chest pain, unspecified: Secondary | ICD-10-CM

## 2023-08-21 LAB — CBC
HCT: 32.7 % — ABNORMAL LOW (ref 39.0–52.0)
Hemoglobin: 11.6 g/dL — ABNORMAL LOW (ref 13.0–17.0)
MCH: 30.9 pg (ref 26.0–34.0)
MCHC: 35.5 g/dL (ref 30.0–36.0)
MCV: 87.2 fL (ref 80.0–100.0)
Platelets: 245 10*3/uL (ref 150–400)
RBC: 3.75 MIL/uL — ABNORMAL LOW (ref 4.22–5.81)
RDW: 18.6 % — ABNORMAL HIGH (ref 11.5–15.5)
WBC: 10 10*3/uL (ref 4.0–10.5)
nRBC: 0 % (ref 0.0–0.2)

## 2023-08-21 LAB — BASIC METABOLIC PANEL
Anion gap: 9 (ref 5–15)
BUN: 14 mg/dL (ref 8–23)
CO2: 26 mmol/L (ref 22–32)
Calcium: 9.6 mg/dL (ref 8.9–10.3)
Chloride: 102 mmol/L (ref 98–111)
Creatinine, Ser: 0.93 mg/dL (ref 0.61–1.24)
GFR, Estimated: >60 mL/min (ref >60)
Glucose, Bld: 272 mg/dL — ABNORMAL HIGH (ref 70–99)
Potassium: 3.8 mmol/L (ref 3.5–5.1)
Sodium: 137 mmol/L (ref 135–145)

## 2023-08-21 LAB — TROPONIN I (HIGH SENSITIVITY): Troponin I (High Sensitivity): 4 ng/L (ref <18)

## 2023-08-21 MED ORDER — ASPIRIN 81 MG PO CHEW
324.0000 mg | CHEWABLE_TABLET | Freq: Once | ORAL | Status: AC
  Administered 2023-08-21: 162 mg via ORAL
  Filled 2023-08-21: qty 4

## 2023-08-21 NOTE — Discharge Instructions (Signed)
Please follow-up with your cardiologist in the office.  Please return for worsening pain especially upon exertion.

## 2023-08-21 NOTE — ED Provider Notes (Signed)
[place] EMERGENCY DEPARTMENT AT [facility] Provider Note   CSN: [id number] Arrival date & time: 08/21/23  1204     History  Chief Complaint  Patient presents with   Chest Pain    Eric Cohen is a 67 y.o. male.  67 yo M with a chief complaints of chest pain.  This has been going on for 3 days now.  It was right-sided initially and seemed to resolve with nitroglycerin.  He had recurrence of this pain today but did not resolve with nitro and came here to be evaluated.  Left-sided worse with deep breathing palpation twisting.  He had a little bit of coughing when he was trying to walk earlier today otherwise denies cough or congestion.  Denies fevers.  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  Denies any missed dosages.   Chest Pain      Home Medications Prior to Admission medications   Medication Sig Start Date End Date Taking? Authorizing Provider  albuterol (PROVENTIL HFA;VENTOLIN HFA) 108 (90 BASE) MCG/ACT inhaler Inhale 2 puffs into the lungs every 6 (six) hours as needed for wheezing.    [provider]  albuterol (PROVENTIL) (2.5 MG/3ML) 0.083% nebulizer solution Take 3 mLs (2.5 mg total) by nebulization every 6 (six) hours as needed for wheezing or shortness of breath. 04/16/21   Koleen Distance, MD  aspirin EC 81 MG tablet Take 81 mg by mouth daily. Swallow whole.    [provider]  atorvastatin (LIPITOR) 80 MG tablet TAKE 1 TABLET (80 MG TOTAL) BY MOUTH DAILY AT 6 PM. 02/20/20   Yates Decamp, MD  azelastine (ASTELIN) 0.1 % nasal spray Place 1 spray into both nostrils 2 (two) times daily as needed (congestion). Use in each nostril as directed    [provider]  Bempedoic Acid-Ezetimibe (NEXLIZET) 180-10 MG TABS Take 1 tablet by mouth daily for 90 doses. 06/28/23 09/26/23  Tolia, Sunit, DO  budesonide-formoterol (SYMBICORT) 160-4.5 MCG/ACT inhaler Inhale 2 puffs into the lungs 2 (two) times daily as needed (shortness of breath/wheezing).     [provider]  carbamazepine (TEGRETOL) 200 MG tablet Take 200 mg by mouth at bedtime.    [provider]  cetirizine (ZYRTEC) 10 MG tablet Take 10 mg by mouth at bedtime.    [provider]  doxepin (SINEQUAN) 50 MG capsule Take 50 mg by mouth daily. 04/30/22   [provider]  doxepin (SINEQUAN) 75 MG capsule Take 75 mg by mouth at bedtime as needed (nightmares).    [provider]  edoxaban (SAVAYSA) 60 MG TABS tablet Take 60 mg by mouth daily.    [provider]  empagliflozin (JARDIANCE) 25 MG TABS tablet Take 25 mg by mouth daily.    [provider]  escitalopram (LEXAPRO) 10 MG tablet Take 10 mg by mouth every morning.    [provider]  fluticasone (FLONASE) 50 MCG/ACT nasal spray Place 1 spray into both nostrils daily as needed (congestion).    [provider]  gabapentin (NEURONTIN) 300 MG capsule Take 600 mg by mouth at bedtime.    [provider]  insulin glargine (LANTUS) 100 UNIT/ML Solostar Pen Inject 68 Units into the skin at bedtime.    [provider]  lidocaine (LIDODERM) 5 % Place 1 patch onto the skin daily. Remove & Discard patch within 12 hours or as directed by MD 06/06/22   Modena Slater, DO  metoprolol tartrate (LOPRESSOR) 50 MG tablet  Take 1 tablet (50 mg total) by mouth 2 (two) times daily. Patient taking differently: Take 25 mg by mouth 2 (two) times daily. 12/08/20   Regalado, Belkys A, MD  montelukast (SINGULAIR) 10 MG tablet Take 10 mg by mouth every morning.    [provider]  nitroGLYCERIN (NITROSTAT) 0.4 MG SL tablet Place 1 tablet (0.4 mg total) under the tongue every 5 (five) minutes as needed for chest pain. 07/13/22 06/28/23  Tolia, Sunit, DO  polyethylene glycol (MIRALAX / GLYCOLAX) 17 g packet Take 17 g by mouth daily as needed for mild constipation. 12/08/20   Regalado, Belkys A, MD  prazosin (MINIPRESS) 5 MG capsule Take 5 mg by mouth at bedtime as needed  (nightmares).    [provider]      Allergies    Shellfish allergy and Lisinopril    Review of Systems   Review of Systems  Cardiovascular:  Positive for chest pain.    Physical Exam Updated Vital Signs BP (!) 142/70 (BP Location: Right Arm)   Pulse 74   Temp 98.5 F (36.9 C) (Oral)   Resp 16   SpO2 97%  Physical Exam Vitals and nursing note reviewed.  Constitutional:      Appearance: He is well-developed.  HENT:     Head: Normocephalic and atraumatic.  Eyes:     Pupils: Pupils are equal, round, and reactive to light.  Neck:     Vascular: No JVD.  Cardiovascular:     Rate and Rhythm: Normal rate and regular rhythm.     Heart sounds: No murmur heard.    No friction rub. No gallop.  Pulmonary:     Effort: No respiratory distress.     Breath sounds: No wheezing.  Chest:     Chest wall: Tenderness present.     Comments: Pain with palpation over the left anterior chest about the mid clavicular line about ribs 3 through 5 reproduces discomfort Abdominal:     General: There is no distension.     Tenderness: There is no abdominal tenderness. There is no guarding or rebound.  Musculoskeletal:        General: Normal range of motion.     Cervical back: Normal range of motion and neck supple.  Skin:    Coloration: Skin is not pale.     Findings: No rash.  Neurological:     Mental Status: He is alert and oriented to person, place, and time.  Psychiatric:        Behavior: Behavior normal.     ED Results / Procedures / Treatments   Labs (all labs ordered are listed, but only abnormal results are displayed) Labs Reviewed  BASIC METABOLIC PANEL - Abnormal; Notable for the following components:      Result Value   Glucose, Bld 272 (*)    All other components within normal limits  CBC - Abnormal; Notable for the following components:   RBC 3.75 (*)    Hemoglobin 11.6 (*)    HCT 32.7 (*)    RDW 18.6 (*)    All other components within normal limits  CBG  MONITORING, ED  TROPONIN I (HIGH SENSITIVITY)    EKG EKG Interpretation Date/Time:  Sunday August 21 2023 12:14:16 EDT Ventricular Rate:  81 PR Interval:  192 QRS Duration:  98 QT Interval:  377 QTC Calculation: 438 R Axis:   17  Text Interpretation: Sinus rhythm Borderline T wave abnormalities Baseline wander in lead(s) V2 No significant change since  last tracing Confirmed by Melene Plan [id number]) on 08/21/2023 12:18:12 PM  Radiology DG Chest Port 1 View  Result Date: 08/21/2023 CLINICAL DATA:  Shortness of breath. Tightness in the left side of chest. EXAM: PORTABLE CHEST 1 VIEW COMPARISON:  04/16/2021 FINDINGS: Degenerative left glenohumeral arthropathy. The lungs appear clear. Cardiac and mediastinal contours normal. No blunting of the costophrenic angles. No additional significant findings. IMPRESSION: 1. No acute findings. 2. Degenerative left glenohumeral arthropathy. Electronically Signed   By: Gaylyn Rong M.D.   On: 08/21/2023 12:39    Procedures Procedures    Medications Ordered in ED Medications  aspirin chewable tablet 324 mg (162 mg Oral Given 08/21/23 1224)    ED Course/ Medical Decision Making/ A&P                                 Medical Decision Making Amount and/or Complexity of Data Reviewed Labs: ordered. Radiology: ordered.  Risk OTC drugs.   67 yo M with a chief complaint of chest pain.  This been atypical in nature off and on switching sides from 1 to the other.  Worse with deep inspiration and palpation.  Most likely this is musculoskeletal by history and physical.  He however does have a history of ACS as well as has had a DVT in the past.  Will obtain laboratory evaluation troponin chest x-ray.  Troponin negative, without significant change in the past 6 hours do not feel that delta would be warranted.  No acute anemia, no significant electrolyte abnormalities.  Chest x-ray independently interpreted by me without focal infiltrate or  pneumothorax.  Patient's pain has improved with aspirin here.  Continue think it is most likely musculoskeletal.  Will have him follow-up with his cardiologist in the office.  Feels completely atypical of pulmonary embolism and no further workup is warranted.  2:26 PM:  I have discussed the diagnosis/risks/treatment options with the patient and family.  Evaluation and diagnostic testing in the emergency department does not suggest an emergent condition requiring admission or immediate intervention beyond what has been performed at this time.  They will follow up with Cards. We also discussed returning to the ED immediately if new or worsening sx occur. We discussed the sx which are most concerning (e.g., sudden worsening pain, fever, inability to tolerate by mouth) that necessitate immediate return. Medications administered to the patient during their visit and any new prescriptions provided to the patient are listed below.  Medications given during this visit Medications  aspirin chewable tablet 324 mg (162 mg Oral Given 08/21/23 1224)     The patient appears reasonably screen and/or stabilized for discharge and I doubt any other medical condition or other [facility] requiring further screening, evaluation, or treatment in the ED at this time prior to discharge.          Final Clinical Impression(s) / ED Diagnoses Final diagnoses:  Nonspecific chest pain    Rx / DC Orders ED Discharge Orders     None         Melene Plan, DO 08/21/23 1426

## 2023-08-21 NOTE — ED Triage Notes (Signed)
Pt c/o exertional SHOB, tightness in L chest, started "a couple days ago but on the R side, NTG helped then."  This AM, "all on L side here, NTG didn't help today." Tender to palpation  Pt speaking in complete sentences, NAD noted in triage

## 2023-08-31 ENCOUNTER — Ambulatory Visit: Payer: Medicare Other | Attending: Cardiology | Admitting: Cardiology

## 2023-08-31 ENCOUNTER — Encounter: Payer: Self-pay | Admitting: Cardiology

## 2023-08-31 VITALS — BP 130/82 | HR 73 | Resp 16 | Ht 71.0 in | Wt 244.4 lb

## 2023-08-31 DIAGNOSIS — Z794 Long term (current) use of insulin: Secondary | ICD-10-CM | POA: Diagnosis present

## 2023-08-31 DIAGNOSIS — E782 Mixed hyperlipidemia: Secondary | ICD-10-CM

## 2023-08-31 DIAGNOSIS — E66811 Obesity, class 1: Secondary | ICD-10-CM | POA: Diagnosis present

## 2023-08-31 DIAGNOSIS — Z86718 Personal history of other venous thrombosis and embolism: Secondary | ICD-10-CM

## 2023-08-31 DIAGNOSIS — Z6834 Body mass index (BMI) 34.0-34.9, adult: Secondary | ICD-10-CM | POA: Diagnosis present

## 2023-08-31 DIAGNOSIS — I25118 Atherosclerotic heart disease of native coronary artery with other forms of angina pectoris: Secondary | ICD-10-CM

## 2023-08-31 DIAGNOSIS — I10 Essential (primary) hypertension: Secondary | ICD-10-CM | POA: Diagnosis not present

## 2023-08-31 DIAGNOSIS — E6609 Other obesity due to excess calories: Secondary | ICD-10-CM

## 2023-08-31 DIAGNOSIS — E119 Type 2 diabetes mellitus without complications: Secondary | ICD-10-CM

## 2023-08-31 DIAGNOSIS — R072 Precordial pain: Secondary | ICD-10-CM

## 2023-08-31 MED ORDER — PREDNISONE 50 MG PO TABS: ORAL_TABLET | ORAL | 0 refills | Status: DC

## 2023-08-31 MED ORDER — DIPHENHYDRAMINE HCL 50 MG PO CAPS: ORAL_CAPSULE | ORAL | 0 refills | Status: AC

## 2023-08-31 NOTE — Patient Instructions (Addendum)
Medication Instructions:  Your physician recommends that you continue on your current medications as directed. Please refer to the Current Medication list given to you today.  *If you need a refill on your cardiac medications before your next appointment, please call your pharmacy*  Lab Work: None ordered today. If you have labs (blood work) drawn today and your tests are completely normal, you will receive your results only by: MyChart Message (if you have MyChart) OR A paper copy in the mail If you have any lab test that is abnormal or we need to change your treatment, we will call you to review the results.  Testing/Procedures: Your physician has requested that you have cardiac CT. Cardiac computed tomography (CT) is a painless test that uses an x-ray machine to take clear, detailed pictures of your heart. For further information please visit [email or web address] Please follow instruction sheet as given.  Your physician has requested that you have an echocardiogram. Echocardiography is a painless test that uses sound waves to create images of your heart. It provides your doctor with information about the size and shape of your heart and how well your heart's chambers and valves are working. This procedure takes approximately one hour. There are no restrictions for this procedure. Please do NOT wear cologne, perfume, aftershave, or lotions (deodorant is allowed). Please arrive 15 minutes prior to your appointment time.  Please note: We ask at that you not bring children with you during ultrasound (echo/ vascular) testing. Due to room size and safety concerns, children are not allowed in the ultrasound rooms during exams. Our front office staff cannot provide observation of children in our lobby area while testing is being conducted. An adult accompanying a patient to their appointment will only be allowed in the ultrasound room at the discretion of the ultrasound technician under special  circumstances. We apologize for any inconvenience.   Follow-Up: At [facility], you and your health needs are our priority.  As part of our continuing mission to provide you with exceptional heart care, we have created designated Provider Care Teams.  These Care Teams include your primary Cardiologist (physician) and Advanced Practice Providers (APPs -  Physician Assistants and Nurse Practitioners) who all work together to provide you with the care you need, when you need it.  We recommend signing up for the patient portal called "MyChart".  Sign up information is provided on this After Visit Summary.  MyChart is used to connect with patients for Virtual Visits (Telemedicine).  Patients are able to view lab/test results, encounter notes, upcoming appointments, etc.  Non-urgent messages can be sent to your provider as well.   To learn more about what you can do with MyChart, go to [email or web address].    Your next appointment:   7 week(s)  The format for your next appointment:   In Person  Provider:   Jari Favre, PA-C, Ronie Spies, PA-C, Robin Searing, NP, Eligha Bridegroom, NP, Tereso Newcomer, PA-C, or Perlie Gold, PA-C. Then, [employer], DO will plan to see you again in 6 month(s).{  Other Instructions   Your cardiac CT will be scheduled at one of the below locations:   [facility] [address] [place], [place] [address] [phone]  If scheduled at [facility], please arrive at the [facility] En[facility] (Entrance C2) of [facility] 30 minutes prior to test start time. You can use the FREE valet parking offered at entrance C (encouraged to control the heart rate for the  test)  Proceed to the [facility] Radiology Department (first floor) to check-in and test prep.  All radiology patients and guests should use entrance C2 at [facility], accessed from [facility], even though the hospital's physical address listed is  [address]     Please follow these instructions carefully (unless otherwise directed):  An IV will be required for this test and Nitroglycerin will be given.  Hold all erectile dysfunction medications at least 3 days (72 hrs) prior to test. (Ie viagra, cialis, sildenafil, tadalafil, etc)   On the Night Before the Test: Be sure to Drink plenty of water. Do not consume any caffeinated/decaffeinated beverages or chocolate 12 hours prior to your test. Do not take any antihistamines 12 hours prior to your test. If the patient has contrast allergy: Patient will need a prescription for Prednisone and very clear instructions (as follows): Prednisone 50 mg - take 13 hours prior to test Take another Prednisone 50 mg 7 hours prior to test Take another Prednisone 50 mg 1 hour prior to test Take Benadryl 50 mg 1 hour prior to test Patient must complete all four doses of above prophylactic medications. Patient will need a ride after test due to Benadryl.  On the Day of the Test: Drink plenty of water until 1 hour prior to the test. Do not eat any food 1 hour prior to test. You may take your regular medications prior to the test.  Take metoprolol (Lopressor) two hours prior to test.      After the Test: Drink plenty of water. After receiving IV contrast, you may experience a mild flushed feeling. This is normal. On occasion, you may experience a mild rash up to 24 hours after the test. This is not dangerous. If this occurs, you can take Benadryl 25 mg and increase your fluid intake. If you experience trouble breathing, this can be serious. If it is severe call 911 IMMEDIATELY. If it is mild, please call our office. If you take any of these medications: Glipizide/Metformin, Avandament, Glucavance, please do not take 48 hours after completing test unless otherwise instructed.  We will call to schedule your test 2-4 weeks out understanding that some insurance companies will need an  authorization prior to the service being performed.   For more information and frequently asked questions, please visit our website : [email or web address]  For non-scheduling related questions, please contact the cardiac imaging nurse navigator should you have any questions/concerns: Cardiac Imaging Nurse Navigators Direct Office Dial: [phone]   For scheduling needs, including cancellations and rescheduling, please call [place], [phone].

## 2023-08-31 NOTE — Progress Notes (Signed)
Cardiology Office Note:  .   Date:  08/31/2023  ID:  Eric Cohen, DOB 01-Apr-1956, MRN [id number] PCP:  Leilani Able, MD  Former Cardiology Providers: Dr. Yates Decamp [facility] Providers Cardiologist:  Tessa Lerner, DO , [facility] (established care April 2021) Electrophysiologist:  None  Click to update primary MD,subspecialty MD or APP then REFRESH:1}    Chief Complaint  Patient presents with   Chest Pain    Acute visit      History of Present Illness: .   Eric Cohen is a 67 y.o. 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   In 2020 patient underwent angiography and was noted to have one-vessel CAD involving the second obtuse marginal branch.  The vessel was felt to be small in caliber and therefore no interventions were performed and medical management was recommended.  Since then he has undergone stress test which was reported to be low risk.  Presents today for an acute visit for evaluation of chest pain.  Precordial pain: Started last week Thursday, August 25, 2023.  Discomfort was right-sided, used his inhaler and nitroglycerin tablet x 2 and the symptoms resolved.  He had similar discomfort on Friday but resolved with 1 sublingual nitroglycerin tablet.  On Sunday the discomfort was now on the left side, no significant improvement after 2 nitroglycerin tablets, went to emergency room department.  EKG did not illustrate STEMI high sensitive troponins negative x 1 patient was reassured and sent home and recommended outpatient follow-up.  He had similar pain yesterday night having difficulty taking a deep breath, and no significant improvement with inhalers or 2 nitroglycerin tablets.  He is having upper respiratory cough but no fevers or chills.  The pain is not brought on by  effort related activities and does not resolve with rest is usually self-limited.  Patient follows up with vascular and vein and given his history of recurrent lower extremity DVTs and is currently on medical therapy. His anticoagulation is currently being managed by hematology (per patient).   Review of Systems: .   Review of Systems  Cardiovascular:  Positive for chest pain. Negative for claudication, irregular heartbeat, leg swelling, near-syncope, orthopnea, palpitations, paroxysmal nocturnal dyspnea and syncope.  Respiratory:  Positive for cough and shortness of breath.   Hematologic/Lymphatic: Negative for bleeding problem.    Studies Reviewed:   EKG: EKG Interpretation Date/Time:  Wednesday August 31 2023 13:38:13 EST Ventricular Rate:  73 PR Interval:  208 QRS Duration:  82 QT Interval:  404 QTC Calculation: 445 R Axis:   8  Text Interpretation: Normal sinus rhythm Nonspecific T wave abnormality When compared with ECG of 21-Aug-2023 12:14, No significant change since last tracing Confirmed by Tessa Lerner ([id number]) on 08/31/2023 1:45:36 PM  Echocardiogram: August 2023: LVEF 60-65%, grade 1 diastolic dysfunction. Mild aortic regurgitation. Mild pulmonic regurgitation See report for additional details  Exercise sestamibi stress test 07/16/2022: Exercise nuclear stress test was performed using Bruce protocol. Patient reached 7 METS, and 96% of age predicted maximum heart rate. Exercise capacity was low. No chest pain reported. normal heart rate response. Resting hypertension 160/90 mmHg with extremely elevated peak stress BP 180/90 mmHg. Stress EKG revealed no ischemic changes. Normal myocardial perfusion. Stress LVEF 65%. No change in myocardial perfusion imaging compared to previous Lexiscan nuclear stress test on 02/25/2020. Low risk study.    Left  heart catheterization 08/31/2019: Right dominant circulation, no significant disease. Left main: Normal.  LAD: Mild diffuse  disease, mid segment has a 20% stenosis.  Mild calcification is evident.  Circumflex: Mild disease in the proximal and mid segment.  OM 2 which is a small to moderate-sized vessel, has a acute angle with a 90% ostial stenosis. LV: Normal LVEDP, EF 55 to 65% without wall motion abnormality.  RADIOLOGY: N/A  Risk Assessment/Calculations:   N/A   Labs:       Latest Ref Rng & Units 08/21/2023   12:20 PM 04/16/2021   11:01 AM 12/07/2020    3:29 AM  CBC  WBC 4.0 - 10.5 K/uL 10.0  10.1  10.0   Hemoglobin 13.0 - 17.0 g/dL 16.1  09.6  04.5   Hematocrit 39.0 - 52.0 % 32.7  36.8  32.1   Platelets 150 - 400 K/uL 245  229  217        Latest Ref Rng & Units 08/21/2023   12:20 PM 04/16/2021   11:01 AM 12/08/2020    2:23 AM  BMP  Glucose 70 - 99 mg/dL 409  811  914   BUN 8 - 23 mg/dL 14  13  22    Creatinine 0.61 - 1.24 mg/dL 7.82  9.56  2.13   Sodium 135 - 145 mmol/L 137  136  139   Potassium 3.5 - 5.1 mmol/L 3.8  3.8  3.6   Chloride 98 - 111 mmol/L 102  100  104   CO2 22 - 32 mmol/L 26  29  25    Calcium 8.9 - 10.3 mg/dL 9.6  9.0  8.8       Latest Ref Rng & Units 08/21/2023   12:20 PM 04/16/2021   11:01 AM 12/08/2020    2:23 AM  CMP  Glucose 70 - 99 mg/dL 086  578  469   BUN 8 - 23 mg/dL 14  13  22    Creatinine 0.61 - 1.24 mg/dL 6.29  5.28  4.13   Sodium 135 - 145 mmol/L 137  136  139   Potassium 3.5 - 5.1 mmol/L 3.8  3.8  3.6   Chloride 98 - 111 mmol/L 102  100  104   CO2 22 - 32 mmol/L 26  29  25    Calcium 8.9 - 10.3 mg/dL 9.6  9.0  8.8   Total Protein 6.5 - 8.1 g/dL  8.7    Total Bilirubin 0.3 - 1.2 mg/dL  1.2    Alkaline Phos 38 - 126 U/L  76    AST 15 - 41 U/L  43    ALT 0 - 44 U/L  42      Lab Results  Component Value Date   CHOL 122 02/07/2020   HDL 35 (L) 02/07/2020   LDLCALC 60 02/07/2020   LDLDIRECT 115 (H) 10/31/2019   TRIG 156 (H) 02/07/2020   CHOLHDL 5.0 08/31/2019   No results for input(s): "LIPOA" in the last 8760 hours. No components found for:  "NTPROBNP" No results for input(s): "PROBNP" in the last 8760 hours. No results for input(s): "TSH" in the last 8760 hours.  External Labs: Collected: September 01, 2022 [facility] Everywhere A1c 6.0 Total cholesterol 166, triglycerides 159 HDL 45, calculated LDL 89 Creatinine 1.14, BUN 13 Sodium 139, potassium 4.7, chloride 106 bicarb 30.  Physical Exam:    Today's Vitals   08/31/23 1334  BP: 130/82  Pulse: 73  Resp: 16  SpO2: 94%  Weight: 244 lb 6.4 oz (110.9 kg)  Height: 5\' 11"  (1.803 m)   Body mass index is 34.09 kg/m. Wt Readings from Last 3 Encounters:  08/31/23 244 lb 6.4 oz (110.9 kg)  06/28/23 243 lb 9.6 oz (110.5 kg)  03/14/23 244 lb (110.7 kg)    Physical Exam  Constitutional: No distress.  hemodynamically stable  Neck: No JVD present.  Cardiovascular: Normal rate, regular rhythm, S1 normal and S2 normal. Exam reveals no gallop, no S3 and no S4.  No murmur heard. Pulmonary/Chest: Effort normal and breath sounds normal. No stridor. He has no wheezes. He has no rales.  Abdominal: Soft. Bowel sounds are normal. He exhibits no distension. There is no abdominal tenderness.  Musculoskeletal:        General: No edema.     Cervical back: Neck supple.  Neurological: He is alert and oriented to person, place, and time. He has intact cranial nerves (2-12).  Skin: Skin is warm.     Impression & Recommendation(s):  Impression:   ICD-10-CM   1. Precordial pain  R07.2 EKG 12-Lead    CT CORONARY MORPH W/CTA COR W/SCORE W/CA W/CM &/OR WO/CM    predniSONE (DELTASONE) 50 MG tablet    diphenhydrAMINE (BENADRYL) 50 MG capsule    ECHOCARDIOGRAM COMPLETE    2. Atherosclerosis of native coronary artery of native heart with other form of angina pectoris (HCC)  I25.118     3. Essential hypertension  I10     4. Type 2 diabetes mellitus without complication, with long-term current use of insulin (HCC)  E11.9    Z79.4     5. Long-term insulin use (HCC)  Z79.4     6.  Mixed hyperlipidemia  E78.2     7. History of DVT (deep vein thrombosis)  Z86.718     8. Class 1 obesity due to excess calories with serious comorbidity and body mass index (BMI) of 34.0 to 34.9 in adult  E66.811    E66.09    Z68.34        Recommendation(s):  Precordial pain Atherosclerosis of native coronary artery of native heart with other form of angina pectoris (HCC) Symptoms are not predominantly cardiac. At times pain resolved with sublingual nitroglycerin tablet-but not always. But concerning enough to go to the emergency room department and also to come in today for an acute office visit. EKG today does not illustrate ischemia or injury pattern High sensitive troponins were negative x 1 during his ER visit. Prior workup noted disease in the OM distribution and was recommended to treat medically. Patient has undergone MPI in the recent past which was reported to be low risk study. Shared decision was to proceed forward with coronary CTA to evaluate for CAC/CAD. Patient is already on Lopressor 50 mg p.o. twice daily Patient will be given a prep prior to contrast administration given his allergies. Have also asked him to follow-up with PCP to evaluate for noncardiac causes of chest pain given his cough-rule out infectious etiology  Essential hypertension Office blood pressures are within acceptable limits. Medications reconciled  Type 2 diabetes mellitus without complication, with long-term current use of insulin (HCC) Long-term insulin use (HCC) Hemoglobin A1c well-controlled as of November 2023. Reemphasized importance of glycemic control. Currently on Lantus, Lipitor, Nexlizet, Jardiance. Patient is allergic to ACE inhibitors.  He may benefit from low-dose ARB given his diabetes-I have asked him today and also in the past to discuss this further with his provider at the [place] as well  as his endocrinologist.  Mixed hyperlipidemia Currently on atorvastatin-on the maximally  tolerated dose. Also on Nexlizet Patient states that he will have his lipids rechecked at the [place].  History of DVT (deep vein thrombosis) Currently on Savaysa. Monitored/managed by other providers and the care team  Orders Placed:  Orders Placed This Encounter  Procedures   CT CORONARY MORPH W/CTA COR W/SCORE W/CA W/CM &/OR WO/CM    Standing Status:   Future    Standing Expiration Date:   08/30/2024    Order Specific Question:   If indicated for the ordered procedure, I authorize the administration of contrast media per Radiology protocol    Answer:   Yes    Order Specific Question:   Initiate Coronary CTA Adult Protocol    Answer:   Yes    Order Specific Question:   If indicated initiate Post Coronary CTA Hypotension Adult Protocol    Answer:   Yes    Order Specific Question:   Does the patient have a contrast media/X-ray dye allergy?    Answer:   No    Order Specific Question:   Authorization:    Answer:   FFR will be ordered if deemed medically necessary   EKG 12-Lead   ECHOCARDIOGRAM COMPLETE    Standing Status:   Future    Standing Expiration Date:   08/30/2024    Order Specific Question:   Where should this test be performed    Answer:   [facility])    Order Specific Question:   Does the patient weigh less than or greater than 250 lbs?    Answer:   Patient weighs less than 250 lbs    Order Specific Question:   Perflutren DEFINITY (image enhancing agent) should be administered unless hypersensitivity or allergy exist    Answer:   Administer Perflutren    Order Specific Question:   Reason for exam-Echo    Answer:   Chest Pain  R07.9     Final Medication List:    Meds ordered this encounter  Medications   predniSONE (DELTASONE) 50 MG tablet    Sig: Take one tablet 13 hours, 7 hours, and 1 hour prior to scan.    Dispense:  3 tablet    Refill:  0   diphenhydrAMINE (BENADRYL) 50 MG capsule    Sig: Take one capsule 1 hour prior to scan.     Dispense:  1 capsule    Refill:  0    Medications Discontinued During This Encounter  Medication Reason   polyethylene glycol (MIRALAX / GLYCOLAX) 17 g packet Patient Preference     Current Outpatient Medications:    albuterol (PROVENTIL HFA;VENTOLIN HFA) 108 (90 BASE) MCG/ACT inhaler, Inhale 2 puffs into the lungs every 6 (six) hours as needed for wheezing., Disp: , Rfl:    albuterol (PROVENTIL) (2.5 MG/3ML) 0.083% nebulizer solution, Take 3 mLs (2.5 mg total) by nebulization every 6 (six) hours as needed for wheezing or shortness of breath., Disp: 75 mL, Rfl: 2   aspirin EC 81 MG tablet, Take 81 mg by mouth daily. Swallow whole., Disp: , Rfl:    atorvastatin (LIPITOR) 80 MG tablet, TAKE 1 TABLET (80 MG TOTAL) BY MOUTH DAILY AT 6 PM., Disp: 90 tablet, Rfl: 2   azelastine (ASTELIN) 0.1 % nasal spray, Place 1 spray into both nostrils 2 (two) times daily as needed (congestion). Use in each nostril as directed, Disp: , Rfl:    Bempedoic Acid-Ezetimibe (NEXLIZET) 180-10 MG  TABS, Take 1 tablet by mouth daily for 90 doses., Disp: 90 tablet, Rfl: 0   budesonide-formoterol (SYMBICORT) 160-4.5 MCG/ACT inhaler, Inhale 2 puffs into the lungs 2 (two) times daily as needed (shortness of breath/wheezing)., Disp: , Rfl:    carbamazepine (TEGRETOL) 200 MG tablet, Take 200 mg by mouth at bedtime., Disp: , Rfl:    cetirizine (ZYRTEC) 10 MG tablet, Take 10 mg by mouth at bedtime., Disp: , Rfl:    diphenhydrAMINE (BENADRYL) 50 MG capsule, Take one capsule 1 hour prior to scan., Disp: 1 capsule, Rfl: 0   doxepin (SINEQUAN) 50 MG capsule, Take 50 mg by mouth daily., Disp: , Rfl:    doxepin (SINEQUAN) 75 MG capsule, Take 75 mg by mouth at bedtime as needed (nightmares)., Disp: , Rfl:    edoxaban (SAVAYSA) 60 MG TABS tablet, Take 60 mg by mouth daily., Disp: , Rfl:    empagliflozin (JARDIANCE) 25 MG TABS tablet, Take 25 mg by mouth daily., Disp: , Rfl:    escitalopram (LEXAPRO) 10 MG tablet, Take 10 mg by mouth every  morning., Disp: , Rfl:    fluticasone (FLONASE) 50 MCG/ACT nasal spray, Place 1 spray into both nostrils daily as needed (congestion)., Disp: , Rfl:    gabapentin (NEURONTIN) 300 MG capsule, Take 600 mg by mouth at bedtime., Disp: , Rfl:    insulin glargine (LANTUS) 100 UNIT/ML Solostar Pen, Inject 68 Units into the skin at bedtime., Disp: , Rfl:    lidocaine (LIDODERM) 5 %, Place 1 patch onto the skin daily. Remove & Discard patch within 12 hours or as directed by MD, Disp: 30 patch, Rfl: 0   metoprolol tartrate (LOPRESSOR) 50 MG tablet, Take 1 tablet (50 mg total) by mouth 2 (two) times daily. (Patient taking differently: Take 25 mg by mouth 2 (two) times daily.), Disp: 30 tablet, Rfl: 0   montelukast (SINGULAIR) 10 MG tablet, Take 10 mg by mouth every morning., Disp: , Rfl:    prazosin (MINIPRESS) 5 MG capsule, Take 5 mg by mouth at bedtime as needed (nightmares)., Disp: , Rfl:    predniSONE (DELTASONE) 50 MG tablet, Take one tablet 13 hours, 7 hours, and 1 hour prior to scan., Disp: 3 tablet, Rfl: 0   nitroGLYCERIN (NITROSTAT) 0.4 MG SL tablet, Place 1 tablet (0.4 mg total) under the tongue every 5 (five) minutes as needed for chest pain., Disp: 25 tablet, Rfl: 3  Consent:   N/A  Disposition:   7 weeks sooner if needed. Patient may be asked to follow-up sooner based on the results of the above-mentioned testing.  His questions and concerns were addressed to his satisfaction. He voices understanding of the recommendations provided during this encounter.    Signed, Tessa Lerner, DO, [facility]  [facility]  [address] [place], [place] [address] 08/31/2023 6:23 PM

## 2023-09-15 ENCOUNTER — Telehealth (HOSPITAL_COMMUNITY): Payer: Self-pay | Admitting: *Deleted

## 2023-09-15 NOTE — Telephone Encounter (Signed)
Reaching out to patient to offer assistance regarding upcoming cardiac imaging study; pt verbalizes understanding of appt date/time, parking situation and where to check in, pre-test NPO status and medications ordered, and verified current allergies; name and call back number provided for further questions should they arise  Larey Brick RN Navigator Cardiac Imaging [facility] [phone] office [phone] cell  Patient denies allergy to IV contrast. He will take 50mg  metoprolol tartrate two hours prior to his cardiac CT scan. He is aware to arrive at 1 PM.

## 2023-09-16 ENCOUNTER — Ambulatory Visit (HOSPITAL_COMMUNITY)
Admission: RE | Admit: 2023-09-16 | Discharge: 2023-09-16 | Disposition: A | Discharge status: Home / Self Care | Payer: Medicare Other | Source: Ambulatory Visit | Attending: Cardiology | Admitting: Cardiology

## 2023-09-16 DIAGNOSIS — R072 Precordial pain: Secondary | ICD-10-CM | POA: Insufficient documentation

## 2023-09-16 DIAGNOSIS — I2510 Atherosclerotic heart disease of native coronary artery without angina pectoris: Secondary | ICD-10-CM

## 2023-09-16 MED ORDER — IOHEXOL 350 MG/ML SOLN
95.0000 mL | Freq: Once | INTRAVENOUS | Status: AC | PRN
  Administered 2023-09-16: 95 mL via INTRAVENOUS

## 2023-09-16 MED ORDER — DILTIAZEM HCL 25 MG/5ML IV SOLN: 10.0000 mg | INTRAVENOUS | Status: DC | PRN

## 2023-09-16 MED ORDER — METOPROLOL TARTRATE 5 MG/5ML IV SOLN
10.0000 mg | INTRAVENOUS | Status: AC | PRN
  Administered 2023-09-16 (×2): 10 mg via INTRAVENOUS

## 2023-09-16 MED ORDER — NITROGLYCERIN 0.4 MG SL SUBL
0.8000 mg | SUBLINGUAL_TABLET | Freq: Once | SUBLINGUAL | Status: AC
  Administered 2023-09-16: 0.8 mg via SUBLINGUAL

## 2023-09-16 MED ORDER — NITROGLYCERIN 0.4 MG SL SUBL
SUBLINGUAL_TABLET | SUBLINGUAL | Status: AC
  Filled 2023-09-16: qty 2

## 2023-09-16 MED ORDER — DILTIAZEM HCL 25 MG/5ML IV SOLN
INTRAVENOUS | Status: AC
  Filled 2023-09-16: qty 5

## 2023-09-16 MED ORDER — METOPROLOL TARTRATE 5 MG/5ML IV SOLN
INTRAVENOUS | Status: AC
Start: 2023-09-16 — End: ?
  Filled 2023-09-16: qty 20

## 2023-09-19 ENCOUNTER — Ambulatory Visit (HOSPITAL_COMMUNITY)
Admission: RE | Admit: 2023-09-19 | Discharge: 2023-09-19 | Disposition: A | Discharge status: Home / Self Care | Payer: Medicare Other | Source: Ambulatory Visit | Attending: Internal Medicine | Admitting: Internal Medicine

## 2023-09-19 ENCOUNTER — Other Ambulatory Visit: Payer: Self-pay | Admitting: Internal Medicine

## 2023-09-19 DIAGNOSIS — R931 Abnormal findings on diagnostic imaging of heart and coronary circulation: Secondary | ICD-10-CM | POA: Insufficient documentation

## 2023-09-19 NOTE — Progress Notes (Signed)
Ct sent for FFR

## 2023-10-04 ENCOUNTER — Ambulatory Visit (HOSPITAL_COMMUNITY): Payer: Medicare Other

## 2023-10-17 ENCOUNTER — Other Ambulatory Visit: Payer: Self-pay | Admitting: Urology

## 2023-10-17 DIAGNOSIS — R93811 Abnormal radiologic findings on diagnostic imaging of right testicle: Secondary | ICD-10-CM

## 2023-10-24 NOTE — Progress Notes (Signed)
 Cardiology Office Note    Patient Name: Eric Cohen Date of Encounter: 10/24/2023  Primary Care Provider:  Ilah Crigler, MD Primary Cardiologist:  Madonna Large, DO Primary Electrophysiologist: None   Past Medical History    Past Medical History:  Diagnosis Date   Asthma    Coronary artery disease    Depression    Diabetes mellitus without complication (HCC)    Diverticulitis    DVT (deep venous thrombosis) (HCC)    Heart disease    Hyperlipidemia    Hypertension     History of Present Illness  Eric Cohen is a 67 y.o. male with a PMH of CAD s/p LHC 2020, IDDM type II, HTN, HLD, DVT, who presents today for 6-week follow-up.  [name] was seen initially in 2020 for evaluation of HTN and chest pain.  He went LHC and 08/2019 noted single-vessel CAD that was small in caliber intervention.  He was treated with antianginal therapy with statin, aspirin  and Plavix .  2D echo was also completed 07/2019 showing EF of 50-55% grade 1 DD and mild concentric LVH.  He was diagnosed with DVT on 01/2020 and had previously been diagnosed with left DVT in 2020.  He was started back on Xarelto  and followed by hematology at the [place].  He was seen in follow-up and continued to have some chest pain and was started on Imdur  30 mg.  He was seen in follow-up 06/2022 and continued to have complaints of chest pain and was started on Protonix  and given his risk factors Lexiscan  Myoview was ordered that reported to be overall low risk.  He was last seen by Dr. Large 08/31/2023 with complaint of chest pain.  During visit patient endorsed discomfort on the right side that resolved with nitroglycerin .  He continued to have chest pain and went to the ED with no ACS noted and normal troponins.  Patient underwent coronary CTA with FFR that showed significant discrete stenosis and calcium  score of 562.  [name] presents today for follow up of chest pain ans SOB. The pain is associated with deep breathing and has been  ongoing for over a month. The patient describes the pain as discomfort and locates it on the right side of the chest and around the lung.  He also reports high blood sugar levels, which they attribute to the steroids and inhalers prescribed for their lung disease. The patient has been on these medications for about two weeks and has noticed an improvement in their breathing. However, they express concern about the impact of high blood sugar.  We discussed following up with his endocrinologist regarding adjustments to his insulin  therapy.  T he did report a history of exposure to asbestos, which they believe may be contributing to their lung disease.  I advised him to follow-up with his pulmonologist regarding this information for further evaluation.  He does report some improvement to shortness of breath and has been physically active walking a short distance uphill over a few blocks.  Review of Systems  Please see the history of present illness.    All other systems reviewed and are otherwise negative except as noted above.  Physical Exam    Wt Readings from Last 3 Encounters:  08/31/23 244 lb 6.4 oz (110.9 kg)  06/28/23 243 lb 9.6 oz (110.5 kg)  03/14/23 244 lb (110.7 kg)   CD:Uyzmz were no vitals filed for this visit.,There is no height or weight on file to calculate BMI. GEN: Well nourished,  well developed in no acute distress Neck: No JVD; No carotid bruits Pulmonary: Clear to auscultation without rales, wheezing or rhonchi  Cardiovascular: Normal rate. Regular rhythm. Normal S1. Normal S2.   Murmurs: There is no murmur.  ABDOMEN: Soft, non-tender, non-distended EXTREMITIES:  No edema; No deformity   EKG/LABS/ Recent Cardiac Studies   ECG personally reviewed by me today -none completed today  Risk Assessment/Calculations:          Lab Results  Component Value Date   WBC 10.0 08/21/2023   HGB 11.6 (L) 08/21/2023   HCT 32.7 (L) 08/21/2023   MCV 87.2 08/21/2023   PLT 245  08/21/2023   Lab Results  Component Value Date   CREATININE 0.93 08/21/2023   BUN 14 08/21/2023   NA 137 08/21/2023   K 3.8 08/21/2023   CL 102 08/21/2023   CO2 26 08/21/2023   Lab Results  Component Value Date   CHOL 122 02/07/2020   HDL 35 (L) 02/07/2020   LDLCALC 60 02/07/2020   LDLDIRECT 115 (H) 10/31/2019   TRIG 156 (H) 02/07/2020   CHOLHDL 5.0 08/31/2019    Lab Results  Component Value Date   HGBA1C 8.7 (H) 12/07/2020   Assessment & Plan    1. Interstitial Lung Disease Persistent dry cough and chest discomfort. CT chest showed ground glass opacities. Pulmonary function tests were normal. Currently on Symbicort and Proventil . -Continue Symbicort and Proventil  as prescribed. -Discuss symptoms and blood sugar control with pulmonologist.  2.  Essential hypertension: -Patient blood pressure today was stable at 132/72 -Continue prazosin 5 mg and metoprolol  50 mg twice daily  3. Coronary Artery Disease: -Previous right-sided chest pain relieved with nitroglycerin . CT coronary angiogram showed borderline narrowing in a side branch of the coronary artery. -Continue current medications including Metoprolol , Prazosin, Jardiance , and Atorvastatin . -Consider long-acting nitroglycerin  if recurrent chest pain.  4.  History of DVT: -Currently stable edoxaban 60 mg daily  5.  DM type II: -Elevated blood sugars possibly due to steroid inhalers. Currently on Lantus  35 units BID. -Adjust insulin  as needed to control blood sugars. -Discuss blood sugar control with endocrinologist.   Disposition: Follow-up with Madonna Large, DO or APP in 6 months    Signed, Wyn Raddle, Jackee Shove, NP 10/24/2023, 7:29 AM [facility]

## 2023-10-25 ENCOUNTER — Ambulatory Visit: Payer: Medicare Other | Attending: Nurse Practitioner | Admitting: Nurse Practitioner

## 2023-10-25 ENCOUNTER — Encounter: Payer: Self-pay | Admitting: Nurse Practitioner

## 2023-10-25 VITALS — BP 132/72 | HR 78 | Resp 18 | Ht 71.0 in | Wt 241.0 lb

## 2023-10-25 DIAGNOSIS — E782 Mixed hyperlipidemia: Secondary | ICD-10-CM | POA: Insufficient documentation

## 2023-10-25 DIAGNOSIS — R072 Precordial pain: Secondary | ICD-10-CM | POA: Diagnosis not present

## 2023-10-25 DIAGNOSIS — Z86718 Personal history of other venous thrombosis and embolism: Secondary | ICD-10-CM | POA: Diagnosis present

## 2023-10-25 DIAGNOSIS — Z794 Long term (current) use of insulin: Secondary | ICD-10-CM | POA: Insufficient documentation

## 2023-10-25 DIAGNOSIS — I10 Essential (primary) hypertension: Secondary | ICD-10-CM | POA: Insufficient documentation

## 2023-10-25 DIAGNOSIS — E119 Type 2 diabetes mellitus without complications: Secondary | ICD-10-CM | POA: Diagnosis present

## 2023-10-25 NOTE — Patient Instructions (Signed)
 Medication Instructions:  Your physician recommends that you continue on your current medications as directed. Please refer to the Current Medication list given to you today.  *If you need a refill on your cardiac medications before your next appointment, please call your pharmacy*   Lab Work: None ordered  If you have labs (blood work) drawn today and your tests are completely normal, you will receive your results only by: MyChart Message (if you have MyChart) OR A paper copy in the mail If you have any lab test that is abnormal or we need to change your treatment, we will call you to review the results.   Testing/Procedures: None ordered   Follow-Up: At [facility], you and your health needs are our priority.  As part of our continuing mission to provide you with exceptional heart care, we have created designated Provider Care Teams.  These Care Teams include your primary Cardiologist (physician) and Advanced Practice Providers (APPs -  Physician Assistants and Nurse Practitioners) who all work together to provide you with the care you need, when you need it.  We recommend signing up for the patient portal called "MyChart".  Sign up information is provided on this After Visit Summary.  MyChart is used to connect with patients for Virtual Visits (Telemedicine).  Patients are able to view lab/test results, encounter notes, upcoming appointments, etc.  Non-urgent messages can be sent to your provider as well.   To learn more about what you can do with MyChart, go to [email or web address].    Your next appointment:   6 month(s)  Provider:   Tessa Lerner, DO     Other Instructions

## 2023-11-08 ENCOUNTER — Other Ambulatory Visit (HOSPITAL_COMMUNITY): Payer: Medicare Other

## 2023-11-17 ENCOUNTER — Other Ambulatory Visit: Payer: Self-pay

## 2023-11-17 DIAGNOSIS — E782 Mixed hyperlipidemia: Secondary | ICD-10-CM

## 2023-12-21 ENCOUNTER — Ambulatory Visit
Admission: RE | Admit: 2023-12-21 | Discharge: 2023-12-21 | Discharge status: Home / Self Care | Payer: Medicare Other | Source: Ambulatory Visit | Attending: Urology

## 2023-12-21 DIAGNOSIS — R93811 Abnormal radiologic findings on diagnostic imaging of right testicle: Secondary | ICD-10-CM

## 2023-12-29 ENCOUNTER — Ambulatory Visit: Payer: Medicare Other | Admitting: Cardiology

## 2024-03-30 ENCOUNTER — Encounter: Payer: Self-pay | Admitting: Cardiology

## 2024-03-30 ENCOUNTER — Ambulatory Visit: Payer: Medicare Other | Attending: Cardiology | Admitting: Cardiology

## 2024-03-30 VITALS — BP 132/70 | HR 73 | Resp 16 | Ht 71.0 in | Wt 238.8 lb

## 2024-03-30 DIAGNOSIS — Z794 Long term (current) use of insulin: Secondary | ICD-10-CM | POA: Diagnosis present

## 2024-03-30 DIAGNOSIS — Z86718 Personal history of other venous thrombosis and embolism: Secondary | ICD-10-CM | POA: Insufficient documentation

## 2024-03-30 DIAGNOSIS — I2510 Atherosclerotic heart disease of native coronary artery without angina pectoris: Secondary | ICD-10-CM | POA: Insufficient documentation

## 2024-03-30 DIAGNOSIS — E119 Type 2 diabetes mellitus without complications: Secondary | ICD-10-CM | POA: Diagnosis present

## 2024-03-30 DIAGNOSIS — E782 Mixed hyperlipidemia: Secondary | ICD-10-CM | POA: Diagnosis present

## 2024-03-30 DIAGNOSIS — I10 Essential (primary) hypertension: Secondary | ICD-10-CM | POA: Diagnosis present

## 2024-03-30 MED ORDER — NEXLIZET 180-10 MG PO TABS: 1.0000 | ORAL_TABLET | Freq: Every day | ORAL | 3 refills | Status: AC

## 2024-03-30 NOTE — Patient Instructions (Signed)
 Medication Instructions:  Restart Nexlizet  180-10 mg once daily (in the morning) *If you need a refill on your cardiac medications before your next appointment, please call your pharmacy*  Lab Work: In 6 weeks, come back for labs, CMP, Lipid Panel (fasting) If you have labs (blood work) drawn today and your tests are completely normal, you will receive your results only by: MyChart Message (if you have MyChart) OR A paper copy in the mail If you have any lab test that is abnormal or we need to change your treatment, we will call you to review the results.  Testing/Procedures: None  Follow-Up: At [facility], you and your health needs are our priority.  As part of our continuing mission to provide you with exceptional heart care, our providers are all part of one team.  This team includes your primary Cardiologist (physician) and Advanced Practice Providers or APPs (Physician Assistants and Nurse Practitioners) who all work together to provide you with the care you need, when you need it.  Your next appointment:   6 month(s)  Provider:   Olinda Bertrand, DO

## 2024-03-30 NOTE — Progress Notes (Signed)
 Cardiology Office Note:  .   Date:  03/30/2024  ID:  Eric Cohen, DOB 07/24/1956, MRN [id number] PCP:  Danella Dunn, MD  Former Cardiology Providers: Dr. Knox Perl [facility] Providers Cardiologist:  Olinda Bertrand, DO , [facility] (established care April 2021) Electrophysiologist:  None  Click to update primary MD,subspecialty MD or APP then REFRESH:1}    Chief Complaint  Patient presents with   Atherosclerosis of native coronary artery of native heart w   Follow-up    History of Present Illness: .   Eric Cohen is a 68 y.o. 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   In 2020 patient underwent angiography and was noted to have one-vessel CAD involving the second obtuse marginal branch.  The vessel was felt to be small in caliber and therefore no interventions were performed and medical management was recommended.  Since then he has undergone stress test which was reported to be low risk.  Focus on improving his modifiable cardiovascular risk factors.  When he presented to the office in November 2024 he was endorsing precordial discomfort.  The symptoms were not completely anginal equivalent but concerning enough that he had gone to the ED and also symptoms were transient being improved by sublingual nitroglycerin  tablets.  Shared decision was to proceed forward with a coronary CTA.  He was noted to have mild to moderate nonobstructive CAD, severe CAC score of 562 and 94th percentile, and CT FFR did not show any significant stenosis.  He was seen by Charles Connor nurse practitioner in December 2024 no significant changes were made and was asked to follow-up in 6 months.    Since last office visit, denies anginal chest pain or heart failure symptoms.  No hospitalizations or urgent care  visits for cardiovascular reasons.  Walks on regular basis but currently having discomfort in bilateral testicles which limits ambulation.  Patient states that he is under the care of urology and plans to have surgery in July 2025.  Patient follows up with vascular and vein and given his history of recurrent lower extremity DVTs and is currently on medical therapy. His anticoagulation is currently being managed by hematology (per patient).   Review of Systems: .   Review of Systems  Cardiovascular:  Negative for chest pain, claudication, irregular heartbeat, leg swelling, near-syncope, orthopnea, palpitations, paroxysmal nocturnal dyspnea and syncope.  Respiratory:  Negative for shortness of breath.   Hematologic/Lymphatic: Negative for bleeding problem.    Studies Reviewed:   EKG: EKG Interpretation Date/Time:  Friday March 30 2024 08:33:47 EDT Ventricular Rate:  69 PR Interval:  218 QRS Duration:  84 QT Interval:  402 QTC Calculation: 430 R Axis:   38  Text Interpretation: Sinus rhythm with 1st degree A-V block Cannot rule out Anterior infarct , age undetermined When compared with ECG of 31-Aug-2023 13:38, No significant change was found Confirmed by Olinda Bertrand [phone]) on 03/30/2024 8:35:49 AM  Echocardiogram: August 2023: LVEF 60-65%, grade 1 diastolic dysfunction. Mild aortic regurgitation. Mild pulmonic regurgitation See report for additional details  Exercise sestamibi stress test 07/16/2022: Exercise nuclear stress test was performed using Bruce protocol. Patient reached 7 METS, and 96% of age predicted maximum heart rate. Exercise capacity was low. No chest pain reported. normal heart rate response. Resting hypertension 160/90 mmHg with extremely elevated peak stress BP 180/90 mmHg. Stress EKG revealed no  ischemic changes. Normal myocardial perfusion. Stress LVEF 65%. No change in myocardial perfusion imaging compared to previous Lexiscan  nuclear stress test on 02/25/2020. Low  risk study.    Left heart catheterization 08/31/2019: Right dominant circulation, no significant disease. Left main: Normal.  LAD: Mild diffuse disease, mid segment has a 20% stenosis.  Mild calcification is evident.  Circumflex: Mild disease in the proximal and mid segment.  OM 2 which is a small to moderate-sized vessel, has a acute angle with a 90% ostial stenosis. LV: Normal LVEDP, EF 55 to 65% without wall motion abnormality.  CCTA 09/19/2023: 1. Mild to moderate mixed non-obstructive CAD, CADRADS = 3. CT FFR will be performed and reported separately.   2. Coronary artery calcium  score is 562, which places the patient in the 94th percentile for age/race and sex-matched controls (MESA).   3. Normal coronary origin with right dominance.   4. Mild aortic leaflet calcification.  5.  Radiology Overread: No acute or significant thoracic abnormality.  CTFFR 09/19/2023:  1. CT FFR analysis did not show any significant discrete stenosis (<0.75).   2. The distal OM1 branch showed borderline stenosis - however, this is tapering and not likely significant to cause angina.   3.  Aggressive cardiovascular risk reduction is recommended.    RADIOLOGY: N/A  Risk Assessment/Calculations:   N/A   Labs:       Latest Ref Rng & Units 08/21/2023   12:20 PM 04/16/2021   11:01 AM 12/07/2020    3:29 AM  CBC  WBC 4.0 - 10.5 K/uL 10.0  10.1  10.0   Hemoglobin 13.0 - 17.0 g/dL 44.0  34.7  42.5   Hematocrit 39.0 - 52.0 % 32.7  36.8  32.1   Platelets 150 - 400 K/uL 245  229  217        Latest Ref Rng & Units 08/21/2023   12:20 PM 04/16/2021   11:01 AM 12/08/2020    2:23 AM  BMP  Glucose 70 - 99 mg/dL 956  387  564   BUN 8 - 23 mg/dL 14  13  22    Creatinine 0.61 - 1.24 mg/dL 3.32  9.51  8.84   Sodium 135 - 145 mmol/L 137  136  139   Potassium 3.5 - 5.1 mmol/L 3.8  3.8  3.6   Chloride 98 - 111 mmol/L 102  100  104   CO2 22 - 32 mmol/L 26  29  25    Calcium  8.9 - 10.3 mg/dL 9.6  9.0  8.8        Latest Ref Rng & Units 08/21/2023   12:20 PM 04/16/2021   11:01 AM 12/08/2020    2:23 AM  CMP  Glucose 70 - 99 mg/dL 166  063  016   BUN 8 - 23 mg/dL 14  13  22    Creatinine 0.61 - 1.24 mg/dL 0.10  9.32  3.55   Sodium 135 - 145 mmol/L 137  136  139   Potassium 3.5 - 5.1 mmol/L 3.8  3.8  3.6   Chloride 98 - 111 mmol/L 102  100  104   CO2 22 - 32 mmol/L 26  29  25    Calcium  8.9 - 10.3 mg/dL 9.6  9.0  8.8   Total Protein 6.5 - 8.1 g/dL  8.7    Total Bilirubin 0.3 - 1.2 mg/dL  1.2    Alkaline Phos 38 - 126 U/L  76    AST 15 - 41 U/L  43  ALT 0 - 44 U/L  42      Lab Results  Component Value Date   CHOL 122 02/07/2020   HDL 35 (L) 02/07/2020   LDLCALC 60 02/07/2020   LDLDIRECT 115 (H) 10/31/2019   TRIG 156 (H) 02/07/2020   CHOLHDL 5.0 08/31/2019   No results for input(s): "LIPOA" in the last 8760 hours. No components found for: "NTPROBNP" No results for input(s): "PROBNP" in the last 8760 hours. No results for input(s): "TSH" in the last 8760 hours.  External Labs: Collected: September 01, 2022 [facility] Everywhere A1c 6.0 Total cholesterol 166, triglycerides 159 HDL 45, calculated LDL 89 Creatinine 1.14, BUN 13 Sodium 139, potassium 4.7, chloride 106 bicarb 30.  Outside labs: Jan 03, 2024 08:46 AM [facility] HGB A1C BLOOD       HGB A1C 7.4 H 4.1-6.5     Jan 03, 2024 08:46 AM [facility] LIPID PANEL (ATELLICA) PLASMA       CHOLESTEROL (ATELLICA) 209 mg/dL H 16-109         TRIGLYCERIDES (ATELLICA) 194 mg/dL H 60-454         HDL CHOLESTEROL (ATELLICA) 46.1 mg/dL   09-81         LDL CHOLESTEROL (ATELLICA) 141.0 mg/dL H 1-91       Physical Exam:    Today's Vitals   03/30/24 0800  BP: 132/70  Pulse: 73  Resp: 16  SpO2: 95%  Weight: 238 lb 12.8 oz (108.3 kg)  Height: 5\' 11"  (1.803 m)   Body mass index is 33.31 kg/m. Wt Readings from Last 3 Encounters:  03/30/24 238 lb 12.8 oz (108.3 kg)  10/25/23 241 lb (109.3 kg)  08/31/23  244 lb 6.4 oz (110.9 kg)    Physical Exam  Constitutional: No distress.  hemodynamically stable  Neck: No JVD present.  Cardiovascular: Normal rate, regular rhythm, S1 normal and S2 normal. Exam reveals no gallop, no S3 and no S4.  No murmur heard. Pulmonary/Chest: Effort normal and breath sounds normal. No stridor. He has no wheezes. He has no rales.  Abdominal: Soft. Bowel sounds are normal. He exhibits no distension. There is no abdominal tenderness.  Musculoskeletal:        General: No edema.     Cervical back: Neck supple.  Neurological: He is alert and oriented to person, place, and time. He has intact cranial nerves (2-12).  Skin: Skin is warm.     Impression & Recommendation(s):  Impression:   ICD-10-CM   1. Atherosclerosis of native coronary artery of native heart without angina pectoris  I25.10 EKG 12-Lead    2. Essential hypertension  I10     3. Type 2 diabetes mellitus without complication, with long-term current use of insulin  (HCC)  E11.9    Z79.4     4. Mixed hyperlipidemia  E78.2 Bempedoic Acid-Ezetimibe  (NEXLIZET ) 180-10 MG TABS    Comprehensive metabolic panel with GFR    Lipid panel    Comprehensive metabolic panel with GFR    Lipid panel    5. History of DVT (deep vein thrombosis)  Z86.718        Recommendation(s):  Precordial pain Atherosclerosis of native coronary artery of native heart without angina pectoris (HCC) Denies anginal chest pain or heart failure symptoms. EKG nonischemic Did undergo coronary CTA since last office visit results reviewed with him in details. Reemphasized importance of improving his modifiable cardiovascular risk factors. External labs from Care Everywhere reviewed-LDL is not at goal.  Patient is already on maximally tolerated dose of atorvastatin .  Patient was on Nexlizet  but after he ran out he did not call us  back for refill.  Likely contributory. Will restart next visit and repeat labs in 6 weeks to reevaluate LFTs and  lipids.  Recommending a goal LDL at least less than 70 mg/dL and if possible close to 55 mg/dL given diabetes and severe CAC. Hemoglobin A1c 7.4, reencouraged him to work with endocrinology and PCP to optimize his glycemic control.  Essential hypertension Office blood pressures are within acceptable limits. Continue Jardiance  25 mg p.o. daily. Continue Lopressor  50 mg p.o. twice daily  Type 2 diabetes mellitus without complication, with long-term current use of insulin  (HCC) Long-term insulin  use (HCC) Currently on Lantus , Lipitor ,  Jardiance . Patient is allergic to ACE inhibitors.  He may benefit from low-dose ARB given his diabetes-I have asked him today and also in the past to discuss this further with his provider at the [facility] as well as his endocrinologist. Start Nexlizet  180/10 mg p.o. daily Reemphasized importance of glycemic control. Outside labs reviewed from Care Everywhere-performed at the [place].  Mixed hyperlipidemia Currently on atorvastatin -on the maximally tolerated dose. Will refill Nexlizet  Follow-up labs in 6 weeks, fasting  History of DVT (deep vein thrombosis) Currently on Savaysa. Monitored/managed by other providers and the care team  Orders Placed:  Orders Placed This Encounter  Procedures   Comprehensive metabolic panel with GFR    Standing Status:   Future    Number of Occurrences:   1    Expected Date:   05/11/2024    Expiration Date:   03/30/2025   Lipid panel    Standing Status:   Future    Number of Occurrences:   1    Expected Date:   05/11/2024    Expiration Date:   03/30/2025   EKG 12-Lead     Final Medication List:    Meds ordered this encounter  Medications   Bempedoic Acid-Ezetimibe  (NEXLIZET ) 180-10 MG TABS    Sig: Take 1 tablet by mouth daily.    Dispense:  90 tablet    Refill:  3    There are no discontinued medications.    Current Outpatient Medications:    albuterol  (PROVENTIL  HFA;VENTOLIN  HFA) 108 (90 BASE) MCG/ACT inhaler, Inhale 2  puffs into the lungs every 6 (six) hours as needed for wheezing., Disp: , Rfl:    albuterol  (PROVENTIL ) (2.5 MG/3ML) 0.083% nebulizer solution, Take 3 mLs (2.5 mg total) by nebulization every 6 (six) hours as needed for wheezing or shortness of breath., Disp: 75 mL, Rfl: 2   aspirin  EC 81 MG tablet, Take 81 mg by mouth daily. Swallow whole., Disp: , Rfl:    atorvastatin  (LIPITOR ) 80 MG tablet, TAKE 1 TABLET (80 MG TOTAL) BY MOUTH DAILY AT 6 PM., Disp: 90 tablet, Rfl: 2   azelastine (ASTELIN) 0.1 % nasal spray, Place 1 spray into both nostrils 2 (two) times daily as needed (congestion). Use in each nostril as directed, Disp: , Rfl:    Bempedoic Acid-Ezetimibe  (NEXLIZET ) 180-10 MG TABS, Take 1 tablet by mouth daily., Disp: 90 tablet, Rfl: 3   budesonide-formoterol (SYMBICORT) 160-4.5 MCG/ACT inhaler, Inhale 2 puffs into the lungs 2 (two) times daily as needed (shortness of breath/wheezing)., Disp: , Rfl:    carbamazepine  (TEGRETOL ) 200 MG tablet, Take 200 mg by mouth at bedtime., Disp: , Rfl:    cetirizine (ZYRTEC) 10 MG tablet, Take 10 mg by mouth at bedtime., Disp: , Rfl:  diphenhydrAMINE  (BENADRYL ) 50 MG capsule, Take one capsule 1 hour prior to scan., Disp: 1 capsule, Rfl: 0   doxepin (SINEQUAN) 75 MG capsule, Take 75 mg by mouth at bedtime as needed (nightmares)., Disp: , Rfl:    edoxaban (SAVAYSA) 60 MG TABS tablet, Take 60 mg by mouth daily., Disp: , Rfl:    empagliflozin  (JARDIANCE ) 25 MG TABS tablet, Take 25 mg by mouth daily., Disp: , Rfl:    escitalopram  (LEXAPRO ) 10 MG tablet, Take 10 mg by mouth every morning., Disp: , Rfl:    fluticasone  (FLONASE ) 50 MCG/ACT nasal spray, Place 1 spray into both nostrils daily as needed (congestion)., Disp: , Rfl:    gabapentin  (NEURONTIN ) 300 MG capsule, Take 600 mg by mouth 3 (three) times daily., Disp: , Rfl:    insulin  glargine (LANTUS ) 100 UNIT/ML Solostar Pen, Inject 68 Units into the skin at bedtime., Disp: , Rfl:    lidocaine  (LIDODERM ) 5 %,  Place 1 patch onto the skin daily. Remove & Discard patch within 12 hours or as directed by MD, Disp: 30 patch, Rfl: 0   metoprolol  tartrate (LOPRESSOR ) 50 MG tablet, Take 1 tablet (50 mg total) by mouth 2 (two) times daily. (Patient taking differently: Take 25 mg by mouth 2 (two) times daily.), Disp: 30 tablet, Rfl: 0   montelukast  (SINGULAIR ) 10 MG tablet, Take 10 mg by mouth every morning., Disp: , Rfl:    prazosin (MINIPRESS) 5 MG capsule, Take 5 mg by mouth at bedtime as needed (nightmares)., Disp: , Rfl:    nitroGLYCERIN  (NITROSTAT ) 0.4 MG SL tablet, Place 1 tablet (0.4 mg total) under the tongue every 5 (five) minutes as needed for chest pain., Disp: 25 tablet, Rfl: 3  Consent:   N/A  Disposition:   6 months sooner if needed  His questions and concerns were addressed to his satisfaction. He voices understanding of the recommendations provided during this encounter.    Signed, Awilda Bogus, [facility]  A Division of [facility] [address], [place], [place] [address]  [place], [place] [address] 03/30/2024 8:36 AM

## 2024-04-18 ENCOUNTER — Telehealth: Payer: Self-pay | Admitting: Cardiology

## 2024-04-18 NOTE — Telephone Encounter (Signed)
 Requesting office will need ASA recommendations. I will send this to preop as it was sent to preop call back in error.

## 2024-04-18 NOTE — Telephone Encounter (Signed)
 Please make sure the requesting provider is aware that the patient is on blood thinner Savaysa due to history of DVT, this is managed by hematology and will need to check with hematology regarding how long to hold it prior to surgery.   Dr. Michele, patient was last seen by you 2 weeks ago in the office, since he had a reassuring coronary CT in Nov 2024 and denies any chest pain during last week, can you provide cardiac clearance for the upcoming procedure?

## 2024-04-18 NOTE — Telephone Encounter (Signed)
   Pre-operative Risk Assessment    Patient Name: Eric Cohen  DOB: 1956/10/16 MRN: [id number]      Request for Surgical Clearance    Procedure:  Bilat microscopic Varicocelectomy with denervation  Date of Surgery:  Clearance 04/24/24                                 Surgeon:  Dr, Lovie Surgeon's Group or Practice Name:  [facility] Phone number:  [phone] Fax number:  [phone]   Type of Clearance Requested:   - Medical    Type of Anesthesia:  General    Additional requests/questions:    SignedRojelio Kays   04/18/2024, 9:13 AM

## 2024-04-18 NOTE — Telephone Encounter (Signed)
 I will update Dr. Lovie to see notes from preop APP Scot Ford, [facility] who has requested input and recommendations from Dr. Michele.

## 2024-04-19 NOTE — Telephone Encounter (Signed)
 Talked with Zona from [facility] and informed her that as of right now there is no update from Dr Michele. Once we have an update from Dr. Michele our office will update them.

## 2024-04-19 NOTE — Telephone Encounter (Signed)
 Zona from [facility] is requesting a callback at [phone] ext [phone] to f/u on clearance since pt has procedure on Tuesday. Please advise

## 2024-04-20 ENCOUNTER — Encounter: Payer: Self-pay | Admitting: Cardiology

## 2024-04-20 NOTE — Telephone Encounter (Signed)
 Attempted to call [facility] to ensure they received the pt's pre-op letter. After being on hold for 10 mins, it automatically made me leave a voicemail. LVM explaining we wanted to make sure they received the clearance letter, left callback number.

## 2024-04-20 NOTE — Telephone Encounter (Signed)
 Attempted to call pt on both home and cell phone, unable to reach. LMTCB on both numbers.

## 2024-04-20 NOTE — Telephone Encounter (Signed)
 Pre-op letter in the chart. Copy sent to Dr. Lovie.   Regards,   Dr. Michele

## 2024-04-23 ENCOUNTER — Telehealth: Payer: Self-pay

## 2024-04-23 ENCOUNTER — Telehealth: Payer: Self-pay | Admitting: Cardiology

## 2024-04-23 NOTE — Telephone Encounter (Signed)
 Spoke with pt's wife and went over the pre-op letter. Pt's wife verbalized understanding of plan and had no further questions at this time.

## 2024-04-23 NOTE — Telephone Encounter (Signed)
Office is calling for update. Please advise 

## 2024-04-23 NOTE — Telephone Encounter (Signed)
 Spoke with [facility] staff who was able to look in pt's chart on Epic and find the pre-op letter from Dr. Michele in the letters section. They verified they have what the need from our office to move forward.

## 2024-04-23 NOTE — Telephone Encounter (Signed)
 Attempted to call pt x2 on both home and cell numbers, unable to reach, LMTCB. Attempted to call pt's wife (DPR on file) and she answered but call was ended after introduction of being from [facility]. Attempted to call pt's wife back, rang once and went to voicemail. LVM that I am trying to call pt to go over pre-op letter and to have him call our office back.

## 2024-04-23 NOTE — Telephone Encounter (Signed)
   Patient Name: Eric Cohen  DOB: 03-Apr-1956 MRN: [id number]  Primary Cardiologist: Madonna Large, DO  Chart reviewed as part of pre-operative protocol coverage. Given past medical history and time since last visit, based on ACC/AHA guidelines, Eric Cohen is at acceptable risk for the planned procedure without further cardiovascular testing.   04/20/2024  Re: Eric Cohen DOB: Dec 03, 1955 MRN: [id number]  Address: [address] [place] [address]   Dear Dr. Lovie,  Eric Cohen is at acceptable risk, from a cardiac standpoint, for his upcoming surgery: Bilateral microscopic Varicocelectomy with denervation .  He has cardiovascular risk factors and comorbid conditions that are optimized and patient is asymptomatic.   May hold Aspirin  for 7 days before the surgery if needed and restart when safe post-surgery.   His anticoagulation is managed by other providers in the care team and I will deferred to them regarding recommendations.   If medical clearance / risk stratification is needed will defer that to primary team.   Please call Dept: [phone] with any additional questions.   Sincerely,    Madonna Large, DO, [facility]  A Division of [facility] [address] [place], [place] [address]  , [place] [address]  The patient was advised that if he develops new symptoms prior to surgery to contact our office to arrange for a follow-up visit, and he verbalized understanding.  I will route this recommendation to the requesting party via Epic fax function and remove from pre-op pool.  Please call with questions.  Wyn Raddle, Jackee Shove, NP 04/23/2024, 10:14 AM

## 2024-04-23 NOTE — Telephone Encounter (Signed)
 Error
